# Patient Record
Sex: Male | Born: 1985 | Race: Black or African American | Hispanic: No | Marital: Single | Smoking: Current some day smoker
Health system: Southern US, Community
[De-identification: ages and names within clinical notes are randomized; demographics above are authoritative.]

## PROBLEM LIST (undated history)

## (undated) DIAGNOSIS — I10 Essential (primary) hypertension: Secondary | ICD-10-CM

## (undated) DIAGNOSIS — B20 Human immunodeficiency virus [HIV] disease: Secondary | ICD-10-CM

## (undated) DIAGNOSIS — Z21 Asymptomatic human immunodeficiency virus [HIV] infection status: Secondary | ICD-10-CM

---
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
## 2005-05-01 ENCOUNTER — Emergency Department (HOSPITAL_COMMUNITY): Admission: EM | Admit: 2005-05-01 | Discharge: 2005-05-02 | Payer: Self-pay | Admitting: Emergency Medicine

## 2006-02-10 ENCOUNTER — Emergency Department (HOSPITAL_COMMUNITY): Admission: EM | Admit: 2006-02-10 | Discharge: 2006-02-10 | Payer: Self-pay | Admitting: Emergency Medicine

## 2006-12-01 ENCOUNTER — Emergency Department (HOSPITAL_COMMUNITY): Admission: EM | Admit: 2006-12-01 | Discharge: 2006-12-01 | Payer: Self-pay | Admitting: Emergency Medicine

## 2007-08-25 ENCOUNTER — Emergency Department (HOSPITAL_COMMUNITY): Admission: EM | Admit: 2007-08-25 | Discharge: 2007-08-25 | Payer: Self-pay | Admitting: Emergency Medicine

## 2007-08-30 ENCOUNTER — Emergency Department (HOSPITAL_COMMUNITY): Admission: EM | Admit: 2007-08-30 | Discharge: 2007-08-30 | Payer: Self-pay | Admitting: Emergency Medicine

## 2007-08-30 ENCOUNTER — Emergency Department (HOSPITAL_COMMUNITY): Admission: EM | Admit: 2007-08-30 | Discharge: 2007-08-31 | Payer: Self-pay | Admitting: Emergency Medicine

## 2007-10-16 ENCOUNTER — Emergency Department (HOSPITAL_COMMUNITY): Admission: EM | Admit: 2007-10-16 | Discharge: 2007-10-16 | Payer: Self-pay | Admitting: Emergency Medicine

## 2009-05-05 ENCOUNTER — Emergency Department (HOSPITAL_COMMUNITY): Admission: EM | Admit: 2009-05-05 | Discharge: 2009-05-05 | Payer: Self-pay | Admitting: Emergency Medicine

## 2009-06-22 ENCOUNTER — Emergency Department (HOSPITAL_COMMUNITY): Admission: EM | Admit: 2009-06-22 | Discharge: 2009-06-22 | Payer: Self-pay | Admitting: Emergency Medicine

## 2009-10-19 ENCOUNTER — Emergency Department (HOSPITAL_COMMUNITY): Admission: EM | Admit: 2009-10-19 | Discharge: 2009-10-19 | Payer: Self-pay | Admitting: Emergency Medicine

## 2010-02-24 ENCOUNTER — Emergency Department (HOSPITAL_COMMUNITY)
Admission: EM | Admit: 2010-02-24 | Discharge: 2010-02-24 | Payer: Self-pay | Source: Home / Self Care | Admitting: Emergency Medicine

## 2010-05-29 LAB — DIFFERENTIAL
Lymphocytes Relative: 31 % (ref 12–46)
Monocytes Absolute: 0.5 10*3/uL (ref 0.1–1.0)
Monocytes Relative: 11 % (ref 3–12)
Neutro Abs: 2.7 10*3/uL (ref 1.7–7.7)

## 2010-05-29 LAB — POCT CARDIAC MARKERS
CKMB, poc: <1 ng/mL — ABNORMAL LOW (ref 1.0–8.0)
Troponin i, poc: <0.05 ng/mL (ref 0.00–0.09)

## 2010-05-29 LAB — CBC
HCT: 40.2 % (ref 39.0–52.0)
Hemoglobin: 13.8 g/dL (ref 13.0–17.0)
MCHC: 34.3 g/dL (ref 30.0–36.0)
WBC: 4.8 10*3/uL (ref 4.0–10.5)

## 2010-05-29 LAB — BASIC METABOLIC PANEL
GFR calc non Af Amer: >60 mL/min (ref >60)
Glucose, Bld: 89 mg/dL (ref 70–99)
Potassium: 4.1 mEq/L (ref 3.5–5.1)
Sodium: 138 mEq/L (ref 135–145)

## 2010-06-03 LAB — BASIC METABOLIC PANEL
CO2: 29 mEq/L (ref 19–32)
GFR calc non Af Amer: >60 mL/min (ref >60)
Glucose, Bld: 100 mg/dL — ABNORMAL HIGH (ref 70–99)
Potassium: 4 mEq/L (ref 3.5–5.1)
Sodium: 139 mEq/L (ref 135–145)

## 2010-06-03 LAB — CBC
HCT: 37.2 % — ABNORMAL LOW (ref 39.0–52.0)
Hemoglobin: 13.1 g/dL (ref 13.0–17.0)
RBC: 4.33 MIL/uL (ref 4.22–5.81)
RDW: 13.6 % (ref 11.5–15.5)

## 2010-06-03 LAB — URINALYSIS, ROUTINE W REFLEX MICROSCOPIC
Bilirubin Urine: NEGATIVE
Glucose, UA: NEGATIVE mg/dL
Ketones, ur: NEGATIVE mg/dL
Nitrite: NEGATIVE
Nitrite: NEGATIVE
Specific Gravity, Urine: 1.01 (ref 1.005–1.030)
Urobilinogen, UA: 0.2 mg/dL (ref 0.0–1.0)
pH: 7 (ref 5.0–8.0)

## 2010-06-03 LAB — URINE MICROSCOPIC-ADD ON

## 2010-06-03 LAB — URINE CULTURE: Culture: NO GROWTH

## 2010-06-03 LAB — DIFFERENTIAL
Basophils Absolute: 0 10*3/uL (ref 0.0–0.1)
Eosinophils Relative: 2 % (ref 0–5)
Lymphocytes Relative: 15 % (ref 12–46)
Monocytes Absolute: 0.6 10*3/uL (ref 0.1–1.0)
Monocytes Relative: 11 % (ref 3–12)

## 2010-07-11 ENCOUNTER — Emergency Department (HOSPITAL_COMMUNITY)
Admission: EM | Admit: 2010-07-11 | Discharge: 2010-07-11 | Disposition: A | Discharge status: Home / Self Care | Payer: Self-pay | Attending: Emergency Medicine | Admitting: Emergency Medicine

## 2010-07-11 ENCOUNTER — Emergency Department (HOSPITAL_COMMUNITY): Payer: Self-pay

## 2010-07-11 DIAGNOSIS — M79609 Pain in unspecified limb: Secondary | ICD-10-CM | POA: Insufficient documentation

## 2010-07-11 DIAGNOSIS — L089 Local infection of the skin and subcutaneous tissue, unspecified: Secondary | ICD-10-CM | POA: Insufficient documentation

## 2010-07-11 LAB — GLUCOSE, CAPILLARY: Glucose-Capillary: 98 mg/dL (ref 70–99)

## 2010-08-04 ENCOUNTER — Emergency Department (HOSPITAL_COMMUNITY)
Admission: EM | Admit: 2010-08-04 | Discharge: 2010-08-05 | Disposition: A | Discharge status: Home / Self Care | Payer: Self-pay | Attending: Emergency Medicine | Admitting: Emergency Medicine

## 2010-08-04 DIAGNOSIS — B353 Tinea pedis: Secondary | ICD-10-CM | POA: Insufficient documentation

## 2010-08-04 DIAGNOSIS — L97509 Non-pressure chronic ulcer of other part of unspecified foot with unspecified severity: Secondary | ICD-10-CM | POA: Insufficient documentation

## 2010-12-10 LAB — CBC
HCT: 38.8 — ABNORMAL LOW
HCT: 40.4
Hemoglobin: 13.5
MCHC: 34.7
MCHC: 34.7
MCV: 86.6
Platelets: 187
RDW: 12.8

## 2010-12-10 LAB — COMPREHENSIVE METABOLIC PANEL
AST: 23
Albumin: 4.4
Alkaline Phosphatase: 55
BUN: 6
BUN: 8
CO2: 26
Calcium: 9.4
Calcium: 9.7
Creatinine, Ser: 1.07
GFR calc Af Amer: >60
GFR calc non Af Amer: >60
GFR calc non Af Amer: >60
Glucose, Bld: 90
Total Bilirubin: 0.7
Total Protein: 7.5

## 2010-12-10 LAB — DIFFERENTIAL
Basophils Absolute: 0
Basophils Relative: 0
Eosinophils Relative: 2
Lymphocytes Relative: 40
Lymphs Abs: 2
Monocytes Relative: 9
Neutro Abs: 2.3
Neutro Abs: 3.1
Neutrophils Relative %: 53

## 2010-12-10 LAB — URINALYSIS, ROUTINE W REFLEX MICROSCOPIC
Bilirubin Urine: NEGATIVE
Ketones, ur: NEGATIVE
Nitrite: NEGATIVE
Protein, ur: NEGATIVE
Urobilinogen, UA: 0.2

## 2010-12-10 LAB — STREP A DNA PROBE: Group A Strep Probe: NEGATIVE

## 2010-12-10 LAB — LIPASE, BLOOD
Lipase: 18
Lipase: 22

## 2010-12-10 LAB — RAPID STREP SCREEN (MED CTR MEBANE ONLY): Streptococcus, Group A Screen (Direct): NEGATIVE

## 2011-02-26 ENCOUNTER — Encounter: Payer: Self-pay | Admitting: *Deleted

## 2011-02-26 ENCOUNTER — Emergency Department (HOSPITAL_COMMUNITY)
Admission: EM | Admit: 2011-02-26 | Discharge: 2011-02-26 | Disposition: A | Discharge status: Home / Self Care | Payer: Self-pay | Attending: Emergency Medicine | Admitting: Emergency Medicine

## 2011-02-26 DIAGNOSIS — R112 Nausea with vomiting, unspecified: Secondary | ICD-10-CM | POA: Insufficient documentation

## 2011-02-26 DIAGNOSIS — R109 Unspecified abdominal pain: Secondary | ICD-10-CM | POA: Insufficient documentation

## 2011-02-26 DIAGNOSIS — J45909 Unspecified asthma, uncomplicated: Secondary | ICD-10-CM | POA: Insufficient documentation

## 2011-02-26 DIAGNOSIS — R509 Fever, unspecified: Secondary | ICD-10-CM | POA: Insufficient documentation

## 2011-02-26 DIAGNOSIS — R197 Diarrhea, unspecified: Secondary | ICD-10-CM | POA: Insufficient documentation

## 2011-02-26 DIAGNOSIS — F17200 Nicotine dependence, unspecified, uncomplicated: Secondary | ICD-10-CM | POA: Insufficient documentation

## 2011-02-26 DIAGNOSIS — R10819 Abdominal tenderness, unspecified site: Secondary | ICD-10-CM | POA: Insufficient documentation

## 2011-02-26 LAB — BASIC METABOLIC PANEL
GFR calc Af Amer: >90 mL/min (ref >90)
GFR calc non Af Amer: >90 mL/min (ref >90)
Glucose, Bld: 100 mg/dL — ABNORMAL HIGH (ref 70–99)
Potassium: 3.5 mEq/L (ref 3.5–5.1)
Sodium: 138 mEq/L (ref 135–145)

## 2011-02-26 MED ORDER — SODIUM CHLORIDE 0.9 % IV BOLUS (SEPSIS)
1000.0000 mL | Freq: Once | INTRAVENOUS | Status: AC
  Administered 2011-02-26: 1000 mL via INTRAVENOUS

## 2011-02-26 MED ORDER — ONDANSETRON HCL 4 MG/2ML IJ SOLN
4.0000 mg | Freq: Once | INTRAMUSCULAR | Status: AC
  Administered 2011-02-26: 4 mg via INTRAVENOUS
  Filled 2011-02-26: qty 2

## 2011-02-26 MED ORDER — ONDANSETRON HCL 4 MG PO TABS: 4.0000 mg | ORAL_TABLET | Freq: Four times a day (QID) | ORAL | Status: AC

## 2011-02-26 MED ORDER — HYDROCODONE-ACETAMINOPHEN 7.5-500 MG/15ML PO SOLN: 7.5000 mL | Freq: Four times a day (QID) | ORAL | Status: AC | PRN

## 2011-02-26 MED ORDER — HYDROMORPHONE HCL PF 1 MG/ML IJ SOLN
1.0000 mg | Freq: Once | INTRAMUSCULAR | Status: AC
  Administered 2011-02-26: 1 mg via INTRAVENOUS
  Filled 2011-02-26: qty 1

## 2011-02-26 MED ORDER — ACETAMINOPHEN 500 MG PO TABS
ORAL_TABLET | ORAL | Status: AC
  Administered 2011-02-26: 1000 mg
  Filled 2011-02-26: qty 2

## 2011-02-26 NOTE — ED Notes (Signed)
NVD for 3 days, fever, Abd and back pain.

## 2011-02-26 NOTE — ED Notes (Signed)
NVD for 3 days.  Feels weak.

## 2011-02-26 NOTE — ED Provider Notes (Signed)
History     CSN: [id number] Arrival date & time: 02/26/2011  5:52 AM   First MD Initiated Contact with Patient 02/26/11 [phone]      Chief Complaint  Patient presents with  . Emesis    (Consider location/radiation/quality/duration/timing/severity/associated sxs/prior treatment) Patient is a 25 y.o. male presenting with vomiting. The history is provided by the patient.  Emesis  This is a new problem. The current episode started 6 to 12 hours ago. The problem occurs 2 to 4 times per day. The problem has not changed since onset.The emesis has an appearance of stomach contents. The maximum temperature recorded prior to his arrival was 102 to 102.9 F. The fever has been present for less than 1 day. Associated symptoms include diarrhea, a fever and sweats. Pertinent negatives include no abdominal pain and no headaches. Risk factors: No history of diabetes, recent antibiotics or recent travel.   lives alone has no known sick contacts. Did not get a flu shot this season. Some abdominal cramping but no severe pain. Her back pain, dysuria, urgency or frequency. No hematuria. No bloody stools or watery stools.  Past Medical History  Diagnosis Date  . Asthma     History reviewed. No pertinent past surgical history.  History reviewed. No pertinent family history.  History  Substance Use Topics  . Smoking status: Current Everyday Smoker    Types: Cigarettes  . Smokeless tobacco: Not on file  . Alcohol Use: No      Review of Systems  Constitutional: Positive for fever.  HENT: Negative for sore throat, neck pain and neck stiffness.   Eyes: Negative for pain.  Respiratory: Negative for shortness of breath.   Cardiovascular: Negative for chest pain.  Gastrointestinal: Positive for nausea, vomiting and diarrhea. Negative for abdominal pain, blood in stool and rectal pain.  Genitourinary: Negative for dysuria.  Musculoskeletal: Negative for back pain.  Skin: Negative for rash.    Neurological: Negative for headaches.  All other systems reviewed and are negative.    Allergies  Review of patient's allergies indicates no known allergies.  Home Medications  No current outpatient prescriptions on file.  BP 153/83  Pulse 91  Temp(Src) 102.1 F (38.9 C) (Oral)  Resp 20  SpO2 98%  Physical Exam  Constitutional: He is oriented to person, place, and time. He appears well-developed and well-nourished.  HENT:  Head: Normocephalic and atraumatic.  Eyes: Conjunctivae and EOM are normal. Pupils are equal, round, and reactive to light.  Neck: Trachea normal. Neck supple. No thyromegaly present.  Cardiovascular: Normal rate, regular rhythm, S1 normal, S2 normal and normal pulses.     No systolic murmur is present   No diastolic murmur is present  Pulses:      Radial pulses are 2+ on the right side, and 2+ on the left side.  Pulmonary/Chest: Effort normal and breath sounds normal. He has no wheezes. He has no rhonchi. He has no rales. He exhibits no tenderness.  Abdominal: Soft. Normal appearance and bowel sounds are normal. He exhibits no mass. There is no rebound, no guarding, no CVA tenderness and negative Murphy's sign.       Very mild diffuse abdominal tenderness without peritoneal signs  Neurological: He is alert and oriented to person, place, and time. He has normal strength. No cranial nerve deficit or sensory deficit. GCS eye subscore is 4. GCS verbal subscore is 5. GCS motor subscore is 6.  Skin: Skin is warm and dry. No rash noted. He is not  diaphoretic.  Psychiatric: His speech is normal.       Cooperative and appropriate    ED Course  Procedures (including critical care time)  Results for orders placed during the hospital encounter of 02/26/11  BASIC METABOLIC PANEL      Component Value Range   Sodium 138  135 - 145 (mEq/L)   Potassium 3.5  3.5 - 5.1 (mEq/L)   Chloride 103  96 - 112 (mEq/L)   CO2 26  19 - 32 (mEq/L)   Glucose, Bld 100 (*) 70 - 99  (mg/dL)   BUN 6  6 - 23 (mg/dL)   Creatinine, Ser 1.61  0.50 - 1.35 (mg/dL)   Calcium 8.4  8.4 - 09.6 (mg/dL)   GFR calc non Af Amer >90  >90 (mL/min)   GFR calc Af Amer >90  >90 (mL/min)     IV fluids, Zofran and Tylenol for fever. Serial abdominal evaluations without peritonitis   MDM   Otherwise healthy adult male presenting with Fever with nausea vomiting and diarrhea. Flulike symptoms. No abdominal findings to suggest indication for imaging at this time.  Patient is not immunocompromised and does not have risk factors for C. Difficile. Given IV fluids and Zofran Tylenol with improving symptoms.        Sunnie Nielsen, MD 02/26/11 [phone]

## 2011-02-27 ENCOUNTER — Encounter (HOSPITAL_COMMUNITY): Payer: Self-pay | Admitting: Emergency Medicine

## 2011-02-27 ENCOUNTER — Emergency Department (HOSPITAL_COMMUNITY)
Admission: EM | Admit: 2011-02-27 | Discharge: 2011-02-27 | Disposition: A | Discharge status: Home / Self Care | Payer: Self-pay | Attending: Emergency Medicine | Admitting: Emergency Medicine

## 2011-02-27 DIAGNOSIS — J111 Influenza due to unidentified influenza virus with other respiratory manifestations: Secondary | ICD-10-CM | POA: Insufficient documentation

## 2011-02-27 DIAGNOSIS — F17200 Nicotine dependence, unspecified, uncomplicated: Secondary | ICD-10-CM | POA: Insufficient documentation

## 2011-02-27 DIAGNOSIS — J45909 Unspecified asthma, uncomplicated: Secondary | ICD-10-CM | POA: Insufficient documentation

## 2011-02-27 DIAGNOSIS — B349 Viral infection, unspecified: Secondary | ICD-10-CM

## 2011-02-27 MED ORDER — ONDANSETRON 8 MG PO TBDP
8.0000 mg | ORAL_TABLET | Freq: Once | ORAL | Status: AC
  Administered 2011-02-27: 8 mg via ORAL
  Filled 2011-02-27: qty 1

## 2011-02-27 MED ORDER — HYDROCODONE-ACETAMINOPHEN 5-325 MG PO TABS: ORAL_TABLET | ORAL | Status: AC

## 2011-02-27 NOTE — ED Notes (Signed)
Pt was seen yesterday and diagnosed with the flu and was given a prescription that he could not afford. So pt is back c/o chills, fever, and body aches.

## 2011-02-27 NOTE — ED Provider Notes (Signed)
History     CSN: [id number] Arrival date & time: 02/27/2011  7:51 AM   First MD Initiated Contact with Patient 02/27/11 [phone]      Chief Complaint  Patient presents with  . Fever  . Chills    (Consider location/radiation/quality/duration/timing/severity/associated sxs/prior treatment) HPI Comments: Patient was seen here yesterday and treated for flu like illness.  States he was given prescriptions for medication, but was unable to afford them.  He returns today c/o continued body aches, chills and fever.  He denies vomiting or dysuria.    Patient is a 25 y.o. male presenting with flu symptoms. The history is provided by the patient.  Influenza This is a new problem. The current episode started in the past 7 days. The problem occurs constantly. The problem has been unchanged. Associated symptoms include chills, congestion, a fever, myalgias and nausea. Pertinent negatives include no abdominal pain, arthralgias, headaches, neck pain, numbness, sore throat, swollen glands, vomiting or weakness. The symptoms are aggravated by nothing. He has tried nothing for the symptoms. The treatment provided no relief.    Past Medical History  Diagnosis Date  . Asthma     History reviewed. No pertinent past surgical history.  History reviewed. No pertinent family history.  History  Substance Use Topics  . Smoking status: Current Everyday Smoker    Types: Cigarettes  . Smokeless tobacco: Not on file  . Alcohol Use: No      Review of Systems  Constitutional: Positive for fever and chills. Negative for activity change and appetite change.  HENT: Positive for congestion. Negative for sore throat, neck pain and neck stiffness.   Gastrointestinal: Positive for nausea. Negative for vomiting, abdominal pain and diarrhea.  Genitourinary: Negative for dysuria.  Musculoskeletal: Positive for myalgias. Negative for arthralgias.  Skin: Negative.   Neurological: Negative for weakness, numbness and  headaches.  All other systems reviewed and are negative.    Allergies  Review of patient's allergies indicates no known allergies.  Home Medications   Current Outpatient Rx  Name Route Sig Dispense Refill  . HYDROCODONE-ACETAMINOPHEN 7.5-500 MG/15ML PO SOLN Oral Take 7.5 mLs by mouth every 6 (six) hours as needed for pain (No driving on medicated). 60 mL 0  . ONDANSETRON HCL 4 MG PO TABS Oral Take 1 tablet (4 mg total) by mouth every 6 (six) hours. 12 tablet 0    BP 143/84  Pulse 73  Temp(Src) 99.2 F (37.3 C) (Oral)  Resp 18  Ht 5\' 7"  (1.702 m)  Wt 187 lb (84.823 kg)  BMI 29.29 kg/m2  SpO2 97%  Physical Exam  Nursing note and vitals reviewed. Constitutional: He is oriented to person, place, and time. He appears well-developed and well-nourished. No distress.  HENT:  Head: Normocephalic and atraumatic.  Mouth/Throat: Oropharynx is clear and moist.  Neck: Normal range of motion. Neck supple.  Cardiovascular: Normal rate, regular rhythm and normal heart sounds.   Pulmonary/Chest: Effort normal and breath sounds normal. No respiratory distress. He exhibits no tenderness.  Abdominal: Soft. He exhibits no distension. There is no tenderness.  Musculoskeletal: Normal range of motion. He exhibits no tenderness.  Lymphadenopathy:    He has no cervical adenopathy.  Neurological: He is alert and oriented to person, place, and time. No cranial nerve deficit. He exhibits normal muscle tone. Coordination normal.  Skin: Skin is warm and dry.    ED Course  Procedures (including critical care time)  Labs Reviewed - No data to display No results found.  No diagnosis found.    MDM    9:28 AM patient is alert, NAD.  Abd is soft, NT.  No guarding or rebound tenderness.  Vitals are stable.  Will try oral fluid challenge.  I have reviewed patient previous ED chart.    10:18 AM patient is feeling better, has been tolerating fluids since earlier this morning. I will change his Rx  to lortab tablets and I have discarded his previous prescription for lortab syrup      Keldric Poyer L. [place], [place] 03/01/11 2023

## 2011-03-01 NOTE — ED Provider Notes (Signed)
Medical screening examination/treatment/procedure(s) were performed by non-physician practitioner and as supervising physician I was immediately available for consultation/collaboration.   KHYLE GOODELL, DO 03/01/11 2118

## 2011-08-13 ENCOUNTER — Encounter (HOSPITAL_COMMUNITY): Payer: Self-pay | Admitting: *Deleted

## 2011-08-13 ENCOUNTER — Emergency Department (HOSPITAL_COMMUNITY): Payer: Self-pay

## 2011-08-13 ENCOUNTER — Emergency Department (HOSPITAL_COMMUNITY)
Admission: EM | Admit: 2011-08-13 | Discharge: 2011-08-13 | Disposition: A | Discharge status: Home / Self Care | Payer: Self-pay | Attending: Emergency Medicine | Admitting: Emergency Medicine

## 2011-08-13 DIAGNOSIS — F17200 Nicotine dependence, unspecified, uncomplicated: Secondary | ICD-10-CM | POA: Insufficient documentation

## 2011-08-13 DIAGNOSIS — M25559 Pain in unspecified hip: Secondary | ICD-10-CM | POA: Insufficient documentation

## 2011-08-13 DIAGNOSIS — S73109A Unspecified sprain of unspecified hip, initial encounter: Secondary | ICD-10-CM

## 2011-08-13 MED ORDER — NAPROXEN 500 MG PO TABS: 500.0000 mg | ORAL_TABLET | Freq: Two times a day (BID) | ORAL | Status: DC

## 2011-08-13 MED ORDER — CYCLOBENZAPRINE HCL 10 MG PO TABS: 10.0000 mg | ORAL_TABLET | Freq: Three times a day (TID) | ORAL | Status: AC | PRN

## 2011-08-13 NOTE — Discharge Instructions (Signed)
Ligament Sprain Ligaments are tough, fibrous tissues that hold bones together at the joints. A sprain can occur when a ligament is stretched. This injury may take several weeks to heal. HOME CARE INSTRUCTIONS   Rest the injured area for as long as directed by your caregiver. Then slowly start using the joint as directed by your caregiver and as the pain allows.   Keep the affected joint raised if possible to lessen swelling.   Apply ice for 15 to 20 minutes to the injured area every couple hours for the first half day, then 3 to 4 times per day for the first 48 hours. Put the ice in a plastic bag and place a towel between the bag of ice and your skin.   Wear any splinting, casting, or elastic bandage applications as instructed.   Only take over-the-counter or prescription medicines for pain, discomfort, or fever as directed by your caregiver. Do not use aspirin immediately after the injury unless instructed by your caregiver. Aspirin can cause increased bleeding and bruising of the tissues.   If you were given crutches, continue to use them as instructed and do not resume weight bearing on the affected extremity until instructed.  SEEK MEDICAL CARE IF:   Your bruising, swelling, or pain increases.   You have cold and numb fingers or toes if your arm or leg was injured.  SEEK IMMEDIATE MEDICAL CARE IF:   Your toes are numb or blue if your leg was injured.   Your fingers are numb or blue if your arm was injured.   Your pain is not responding to medicines and continues to stay the same or gets worse.  MAKE SURE YOU:   Understand these instructions.   Will watch your condition.   Will get help right away if you are not doing well or get worse.  Document Released: 02/27/2000 Document Revised: 02/18/2011 Document Reviewed: 12/26/2007 [facility] Patient Information 2012 [place], [place].Hip Pain The hips join the upper legs to the lower pelvis. The bones, cartilage, tendons, and muscles of  the hip joint perform a lot of work each day holding your body weight and allowing you to move around. Hip pain is a common symptom. It can range from a minor ache to severe pain on 1 or both hips. Pain may be felt on the inside of the hip joint near the groin, or the outside near the buttocks and upper thigh. There may be swelling or stiffness as well. It occurs more often when a person walks or performs activity. There are many reasons hip pain can develop. CAUSES  It is important to work with your caregiver to identify the cause since many conditions can impact the bones, cartilage, muscles, and tendons of the hips. Causes for hip pain include:  Broken (fractured) bones.   Separation of the thighbone from the hip socket (dislocation).   Torn cartilage of the hip joint.   Swelling (inflammation) of a tendon (tendonitis), the sac within the hip joint (bursitis), or a joint.   A weakening in the abdominal wall (hernia), affecting the nerves to the hip.   Arthritis in the hip joint or lining of the hip joint.   Pinched nerves in the back, hip, or upper thigh.   A bulging disc in the spine (herniated disc).   Rarely, bone infection or cancer.  DIAGNOSIS  The location of your hip pain will help your caregiver understand what may be causing the pain. A diagnosis is based on your medical  history, your symptoms, results from your physical exam, and results from diagnostic tests. Diagnostic tests may include X-ray exams, a computerized magnetic scan (magnetic resonance imaging, MRI), or bone scan. TREATMENT  Treatment will depend on the cause of your hip pain. Treatment may include:  Limiting activities and resting until symptoms improve.   Crutches or other walking supports (a cane or brace).   Ice, elevation, and compression.   Physical therapy or home exercises.   Shoe inserts or special shoes.   Losing weight.   Medications to reduce pain.   Undergoing surgery.  HOME CARE  INSTRUCTIONS   Only take over-the-counter or prescription medicines for pain, discomfort, or fever as directed by your caregiver.   Put ice on the injured area:   Put ice in a plastic bag.   Place a towel between your skin and the bag.   Leave the ice on for 15 to 20 minutes at a time, 3 to 4 times a day.   Keep your leg raised (elevated) when possible to lessen swelling.   Avoid activities that cause pain.   Follow specific exercises as directed by your caregiver.   Sleep with a pillow between your legs on your most comfortable side.   Record how often you have hip pain, the location of the pain, and what it feels like. This information may be helpful to you and your caregiver.   Ask your caregiver about returning to work or sports and whether you should drive.   Follow up with your caregiver for further exams, therapy, or testing as directed.  SEEK MEDICAL CARE IF:   Your pain or swelling continues or worsens after 1 week.   You are feeling unwell or have chills.   You have increasing difficulty with walking.   You have a loss of sensation or other new symptoms.   You have questions or concerns.  SEEK IMMEDIATE MEDICAL CARE IF:   You cannot put weight on the affected hip.   You have fallen.   You have a sudden increase in pain and swelling in your hip.   You have a fever.  MAKE SURE YOU:   Understand these instructions.   Will watch your condition.   Will get help right away if you are not doing well or get worse.  Document Released: 08/19/2009 Document Revised: 02/18/2011 Document Reviewed: 08/19/2009 [facility] Patient Information 2012 [place], [place].

## 2011-08-13 NOTE — ED Provider Notes (Signed)
History     CSN: [id number]  Arrival date & time 08/13/11  1907   First MD Initiated Contact with Patient 08/13/11 1939      Chief Complaint  Patient presents with  . Hip Pain    (Consider location/radiation/quality/duration/timing/severity/associated sxs/prior treatment) HPI Comments: Patient c/o pain to his right lateral hip after practicing a "dance move" and twisted his hip while his foot was stationary.  Pain is worse with flexing the hip or weight bearing and improves with rest.  He has not taken any medications or applied ice. Denies back pain, abd pain or radiating pain    Patient is a 26 y.o. male presenting with hip pain. The history is provided by the patient.  Hip Pain This is a new problem. The current episode started today. The problem occurs constantly. The problem has been unchanged. Associated symptoms include arthralgias. Pertinent negatives include no abdominal pain, chest pain, chills, fever, joint swelling, nausea, numbness, urinary symptoms, vomiting or weakness. The symptoms are aggravated by standing, twisting and walking. He has tried nothing for the symptoms. The treatment provided no relief.    Past Medical History  Diagnosis Date  . Asthma     No past surgical history on file.  History reviewed. No pertinent family history.  History  Substance Use Topics  . Smoking status: Current Everyday Smoker    Types: Cigarettes  . Smokeless tobacco: Not on file  . Alcohol Use: No      Review of Systems  Constitutional: Negative for fever and chills.  Cardiovascular: Negative for chest pain.  Gastrointestinal: Negative for nausea, vomiting and abdominal pain.  Genitourinary: Negative for dysuria, flank pain, decreased urine volume, scrotal swelling, difficulty urinating and testicular pain.  Musculoskeletal: Positive for arthralgias. Negative for joint swelling.  Skin: Negative for color change and wound.  Neurological: Negative for weakness and  numbness.  All other systems reviewed and are negative.    Allergies  Peanut-containing drug products  Home Medications   Current Outpatient Rx  Name Route Sig Dispense Refill  . DIPHENHYDRAMINE-APAP (SLEEP) 25-500 MG PO TABS Oral Take 2 tablets by mouth as needed. For pain      BP 147/81  Pulse 89  Temp(Src) 99.1 F (37.3 C) (Oral)  Resp 20  Ht 5\' 8"  (1.727 m)  Wt 180 lb (81.647 kg)  BMI 27.37 kg/m2  SpO2 100%  Physical Exam  Nursing note and vitals reviewed. Constitutional: He is oriented to person, place, and time. He appears well-developed and well-nourished. No distress.  HENT:  Head: Normocephalic and atraumatic.  Neck: Normal range of motion. Neck supple.  Cardiovascular: Normal rate, regular rhythm and intact distal pulses.   No murmur heard. Pulmonary/Chest: Effort normal and breath sounds normal.  Musculoskeletal: He exhibits tenderness. He exhibits no edema.       Right hip: He exhibits tenderness. He exhibits normal range of motion, normal strength, no bony tenderness, no swelling, no crepitus, no deformity and no laceration.       Legs: Neurological: He is alert and oriented to person, place, and time. No cranial nerve deficit or sensory deficit. He exhibits normal muscle tone. Coordination and gait normal.  Reflex Scores:      Patellar reflexes are 2+ on the right side and 2+ on the left side.      Achilles reflexes are 2+ on the right side and 2+ on the left side. Skin: Skin is warm and dry.    ED Course  Procedures (including critical  care time)  Labs Reviewed - No data to display Dg Hip Complete Right  08/13/2011  *RADIOLOGY REPORT*  Clinical Data: Hip pain  RIGHT HIP - COMPLETE 2+ VIEW  Comparison: None  Findings: There is no evidence of fracture or dislocation.  There is no evidence of arthropathy or other focal bone abnormality. Soft tissues are unremarkable.  IMPRESSION: Negative exam.  Original Report Authenticated By: Rosealee Albee, M.D.         MDM     Patient ambulates with a limp on the right, no focal neuro deficits on exam.  Likely sprain/strain.  Clinical suspicion for infectious or neurological process is low.  Pt agrees to f/u with ortho if needed  Patient / Family / Caregiver understand and agree with initial ED impression and plan with expectations set for ED visit. Pt stable in ED with no significant deterioration in condition. Pt feels improved after observation and/or treatment in ED.    Prescribed: Naprosyn flexeril   Ramadan Couey L. [place], [place] 08/17/11 1947

## 2011-08-13 NOTE — ED Notes (Signed)
Pt states was in dance practice, "busting a move when my hip popped". Pt can bear weight on rt hip/leg, however pt was noticed limping when he ambulated to radiology. No deformity noted.

## 2011-08-13 NOTE — ED Notes (Signed)
Pain rt hip after practicing a dance move. Heard a pop

## 2011-08-21 NOTE — ED Provider Notes (Signed)
Medical screening examination/treatment/procedure(s) were performed by non-physician practitioner and as supervising physician I was immediately available for consultation/collaboration.   Alaya Iverson W. Azoria Abbett, MD 08/21/11 0022 

## 2011-10-08 ENCOUNTER — Emergency Department (HOSPITAL_COMMUNITY)
Admission: EM | Admit: 2011-10-08 | Discharge: 2011-10-08 | Disposition: A | Discharge status: Home / Self Care | Payer: Self-pay | Attending: Emergency Medicine | Admitting: Emergency Medicine

## 2011-10-08 ENCOUNTER — Emergency Department (HOSPITAL_COMMUNITY): Payer: Self-pay

## 2011-10-08 ENCOUNTER — Encounter (HOSPITAL_COMMUNITY): Payer: Self-pay

## 2011-10-08 DIAGNOSIS — M25572 Pain in left ankle and joints of left foot: Secondary | ICD-10-CM

## 2011-10-08 DIAGNOSIS — M25579 Pain in unspecified ankle and joints of unspecified foot: Secondary | ICD-10-CM | POA: Insufficient documentation

## 2011-10-08 DIAGNOSIS — F17200 Nicotine dependence, unspecified, uncomplicated: Secondary | ICD-10-CM | POA: Insufficient documentation

## 2011-10-08 MED ORDER — NAPROXEN 250 MG PO TABS: 250.0000 mg | ORAL_TABLET | Freq: Two times a day (BID) | ORAL | Status: DC

## 2011-10-08 MED ORDER — ACETAMINOPHEN 325 MG PO TABS
650.0000 mg | ORAL_TABLET | Freq: Once | ORAL | Status: AC
  Administered 2011-10-08: 650 mg via ORAL
  Filled 2011-10-08: qty 2

## 2011-10-08 MED ORDER — IBUPROFEN 400 MG PO TABS
400.0000 mg | ORAL_TABLET | Freq: Once | ORAL | Status: AC
  Administered 2011-10-08: 400 mg via ORAL
  Filled 2011-10-08: qty 1

## 2011-10-08 NOTE — ED Notes (Signed)
MD at bedside. 

## 2011-10-08 NOTE — ED Notes (Signed)
Pt reports pain to his ankles bilaterally.  Pt reports doing a lot of walking, and dancing.  Pt reports " i think i just put too much stress on them".   Pt denies any injury.

## 2011-10-08 NOTE — ED Notes (Signed)
C/o bilateral ankle pain, able to ambulate to room

## 2011-10-08 NOTE — ED Provider Notes (Signed)
History     CSN: [id number]  Arrival date & time 10/08/11  1301   First MD Initiated Contact with Patient 10/08/11 1314      Chief Complaint  Patient presents with  . Ankle Pain     HPI Pt was seen at 1315.  Per pt, c/o gradual onset and persistence of constant bilateral ankles "pain" that began this morning.  Pt states for the past several days he has been "doing a lot of dancing and walking."  Denies injury, no hips/knees/feet pain, no fevers, no rash, no swelling, no focal motor weakness, no tingling/numbness in extremities.     Past Medical History  Diagnosis Date  . Asthma     History reviewed. No pertinent past surgical history.   History  Substance Use Topics  . Smoking status: Current Everyday Smoker    Types: Cigarettes  . Smokeless tobacco: Not on file  . Alcohol Use: No    Review of Systems ROS: Statement: All systems negative except as marked or noted in the HPI; Constitutional: Negative for fever and chills. ; ; Eyes: Negative for eye pain, redness and discharge. ; ; ENMT: Negative for ear pain, hoarseness, nasal congestion, sinus pressure and sore throat. ; ; Cardiovascular: Negative for chest pain, palpitations, diaphoresis, dyspnea and peripheral edema. ; ; Respiratory: Negative for cough, wheezing and stridor. ; ; Gastrointestinal: Negative for nausea, vomiting, diarrhea, abdominal pain, blood in stool, hematemesis, jaundice and rectal bleeding. . ; ; Genitourinary: Negative for dysuria, flank pain and hematuria. ; ; Musculoskeletal: +ankles pain. Negative for back pain and neck pain. Negative for deformity..; ; Skin: Negative for pruritus, rash, abrasions, blisters, bruising and skin lesion.; ; Neuro: Negative for headache, lightheadedness and neck stiffness. Negative for weakness, altered level of consciousness , altered mental status, extremity weakness, paresthesias, involuntary movement, seizure and syncope.     Allergies  Peanut-containing drug  products  Home Medications   Current Outpatient Rx  Name Route Sig Dispense Refill  . DIPHENHYDRAMINE-APAP (SLEEP) 25-500 MG PO TABS Oral Take 2 tablets by mouth as needed. For pain    . NAPROXEN 500 MG PO TABS Oral Take 1 tablet (500 mg total) by mouth 2 (two) times daily. 20 tablet 0    BP 164/95  Pulse 84  Temp 98.1 F (36.7 C) (Oral)  Resp 18  SpO2 100%  Physical Exam 1320: Physical examination:  Nursing notes reviewed; Vital signs and O2 SAT reviewed;  Constitutional: Well developed, Well nourished, Well hydrated, In no acute distress; Head:  Normocephalic, atraumatic; Eyes: EOMI, PERRL, No scleral icterus; ENMT: Mouth and pharynx normal, Mucous membranes moist; Neck: Supple, Full range of motion, No lymphadenopathy; Cardiovascular: Regular rate and rhythm, No murmur, rub, or gallop; Respiratory: Breath sounds clear & equal bilaterally, No rales, rhonchi, wheezes.  Speaking full sentences with ease, Normal respiratory effort/excursion; Chest: Nontender, Movement normal; Extremities: NT bilat knees, ankles, and feet. NMS intact bilateral feet, strong pedal pp, bilat LE muscle compartments soft.  No bilat proximal fibular head tenderness.  No edema, no deformity, no ecchymosis, no erythema, no warmth, no open wounds to bilat ankles.  +plantarflexion of bilat feet w/calf squeeze.  No palpable gap bilat Achilles's tendons. Pulses normal, No deformity. No edema, No calf edema or asymmetry.; Neuro: AA&Ox3, Major CN grossly intact.  Speech clear. No gross focal motor or sensory deficits in extremities.; Skin: Color normal, Warm, Dry.   ED Course  Procedures   MDM  MDM Reviewed: nursing note, vitals and previous  chart Interpretation: x-ray     Dg Ankle Complete Left 10/08/2011  *RADIOLOGY REPORT*  Clinical Data: Bilateral ankle pain  LEFT ANKLE COMPLETE - 3+ VIEW  Comparison: None.  Findings: Three views of the left ankle submitted.  No acute fracture or subluxation.  Ankle mortise is  preserved.  IMPRESSION: No acute fracture or subluxation.  Original Report Authenticated By: Natasha Mead, M.D.   Dg Ankle Complete Right 10/08/2011  *RADIOLOGY REPORT*  Clinical Data: Bilateral ankle pain  RIGHT ANKLE - COMPLETE 3+ VIEW  Comparison: None.  Findings: Three views of the right ankle submitted.  No acute fracture or subluxation.  Ankle mortise is preserved.  IMPRESSION: No acute fracture or subluxation.  Original Report Authenticated By: Natasha Mead, M.D.     1400:  No acute findings on XR or on exam.  Will tx symptomatically for what appears to be overuse at this time.  Dx testing d/w pt.  Questions answered.  Verb understanding, agreeable to d/c home with outpt f/u.     Laray Anger, DO 10/08/11 Merrily Brittle

## 2012-06-24 ENCOUNTER — Emergency Department (HOSPITAL_COMMUNITY): Payer: Self-pay

## 2012-06-24 ENCOUNTER — Encounter (HOSPITAL_COMMUNITY): Payer: Self-pay | Admitting: Emergency Medicine

## 2012-06-24 ENCOUNTER — Emergency Department (HOSPITAL_COMMUNITY)
Admission: EM | Admit: 2012-06-24 | Discharge: 2012-06-24 | Disposition: A | Discharge status: Home / Self Care | Payer: Self-pay | Attending: Emergency Medicine | Admitting: Emergency Medicine

## 2012-06-24 DIAGNOSIS — R1084 Generalized abdominal pain: Secondary | ICD-10-CM | POA: Insufficient documentation

## 2012-06-24 DIAGNOSIS — J45909 Unspecified asthma, uncomplicated: Secondary | ICD-10-CM | POA: Insufficient documentation

## 2012-06-24 DIAGNOSIS — R109 Unspecified abdominal pain: Secondary | ICD-10-CM

## 2012-06-24 DIAGNOSIS — F17200 Nicotine dependence, unspecified, uncomplicated: Secondary | ICD-10-CM | POA: Insufficient documentation

## 2012-06-24 DIAGNOSIS — R51 Headache: Secondary | ICD-10-CM | POA: Insufficient documentation

## 2012-06-24 DIAGNOSIS — R110 Nausea: Secondary | ICD-10-CM | POA: Insufficient documentation

## 2012-06-24 LAB — CBC WITH DIFFERENTIAL/PLATELET
Basophils Absolute: 0 10*3/uL (ref 0.0–0.1)
Basophils Relative: 0 % (ref 0–1)
Eosinophils Absolute: 0 10*3/uL (ref 0.0–0.7)
Eosinophils Relative: 1 % (ref 0–5)
HCT: 30 % — ABNORMAL LOW (ref 39.0–52.0)
Hemoglobin: 10.6 g/dL — ABNORMAL LOW (ref 13.0–17.0)
Lymphocytes Relative: 26 % (ref 12–46)
Lymphs Abs: 1.2 10*3/uL (ref 0.7–4.0)
MCH: 28.6 pg (ref 26.0–34.0)
MCHC: 35.3 g/dL (ref 30.0–36.0)
MCV: 81.1 fL (ref 78.0–100.0)
Monocytes Absolute: 0.6 10*3/uL (ref 0.1–1.0)
Monocytes Relative: 12 % (ref 3–12)
Neutro Abs: 2.9 10*3/uL (ref 1.7–7.7)
Neutrophils Relative %: 61 % (ref 43–77)
Platelets: 246 10*3/uL (ref 150–400)
RBC: 3.7 MIL/uL — ABNORMAL LOW (ref 4.22–5.81)
RDW: 13.5 % (ref 11.5–15.5)
WBC: 4.7 10*3/uL (ref 4.0–10.5)

## 2012-06-24 LAB — COMPREHENSIVE METABOLIC PANEL
ALT: 11 U/L (ref 0–53)
AST: 22 U/L (ref 0–37)
Albumin: 3.1 g/dL — ABNORMAL LOW (ref 3.5–5.2)
Alkaline Phosphatase: 63 U/L (ref 39–117)
BUN: 6 mg/dL (ref 6–23)
CO2: 28 mEq/L (ref 19–32)
Calcium: 8.7 mg/dL (ref 8.4–10.5)
Chloride: 99 mEq/L (ref 96–112)
Creatinine, Ser: 0.96 mg/dL (ref 0.50–1.35)
GFR calc Af Amer: >90 mL/min (ref >90)
GFR calc non Af Amer: >90 mL/min (ref >90)
Glucose, Bld: 83 mg/dL (ref 70–99)
Potassium: 3.7 mEq/L (ref 3.5–5.1)
Sodium: 134 mEq/L — ABNORMAL LOW (ref 135–145)
Total Bilirubin: 0.2 mg/dL — ABNORMAL LOW (ref 0.3–1.2)
Total Protein: 10.4 g/dL — ABNORMAL HIGH (ref 6.0–8.3)

## 2012-06-24 LAB — URINALYSIS, ROUTINE W REFLEX MICROSCOPIC
Bilirubin Urine: NEGATIVE
Glucose, UA: NEGATIVE mg/dL
Ketones, ur: NEGATIVE mg/dL
Leukocytes, UA: NEGATIVE
Nitrite: NEGATIVE
Protein, ur: 30 mg/dL — AB
Specific Gravity, Urine: 1.025 (ref 1.005–1.030)
Urobilinogen, UA: 0.2 mg/dL (ref 0.0–1.0)
pH: 6 (ref 5.0–8.0)

## 2012-06-24 LAB — URINE MICROSCOPIC-ADD ON

## 2012-06-24 LAB — LIPASE, BLOOD: Lipase: 20 U/L (ref 11–59)

## 2012-06-24 MED ORDER — IOHEXOL 300 MG/ML  SOLN
50.0000 mL | Freq: Once | INTRAMUSCULAR | Status: AC | PRN
  Administered 2012-06-24: 50 mL via ORAL

## 2012-06-24 MED ORDER — DIPHENHYDRAMINE HCL 50 MG/ML IJ SOLN
25.0000 mg | Freq: Once | INTRAMUSCULAR | Status: AC
  Administered 2012-06-24: 25 mg via INTRAVENOUS
  Filled 2012-06-24: qty 1

## 2012-06-24 MED ORDER — ONDANSETRON HCL 4 MG/2ML IJ SOLN
4.0000 mg | Freq: Once | INTRAMUSCULAR | Status: AC
Start: 2012-06-24 — End: 2012-06-24
  Administered 2012-06-24: 4 mg via INTRAVENOUS
  Filled 2012-06-24: qty 2

## 2012-06-24 MED ORDER — IOHEXOL 300 MG/ML  SOLN
100.0000 mL | Freq: Once | INTRAMUSCULAR | Status: AC | PRN
  Administered 2012-06-24: 100 mL via INTRAVENOUS

## 2012-06-24 MED ORDER — KETOROLAC TROMETHAMINE 30 MG/ML IJ SOLN
30.0000 mg | Freq: Once | INTRAMUSCULAR | Status: AC
  Administered 2012-06-24: 30 mg via INTRAVENOUS
  Filled 2012-06-24: qty 1

## 2012-06-24 MED ORDER — FENTANYL CITRATE 0.05 MG/ML IJ SOLN
100.0000 ug | Freq: Once | INTRAMUSCULAR | Status: AC
  Administered 2012-06-24: 100 ug via INTRAVENOUS
  Filled 2012-06-24: qty 2

## 2012-06-24 MED ORDER — SODIUM CHLORIDE 0.9 % IV BOLUS (SEPSIS)
1000.0000 mL | Freq: Once | INTRAVENOUS | Status: AC
  Administered 2012-06-24: 1000 mL via INTRAVENOUS

## 2012-06-24 MED ORDER — METOCLOPRAMIDE HCL 5 MG/ML IJ SOLN
10.0000 mg | Freq: Once | INTRAMUSCULAR | Status: AC
  Administered 2012-06-24: 10 mg via INTRAVENOUS
  Filled 2012-06-24: qty 2

## 2012-06-24 NOTE — ED Notes (Signed)
AVW:[id number]<XB> Expected date:06/24/12<BR> Expected time: 3:06 PM<BR> Means of arrival:Ambulance<BR> Comments:<BR> Headache

## 2012-06-24 NOTE — ED Notes (Signed)
Pt arrives by EMS from home c/o headache x's 4 days ago, reports has worsened today. Pt took ibuprofen last night without relief. Reports nuasea.

## 2012-06-24 NOTE — ED Notes (Signed)
Pt states he began having abdominal pain and a migraine four days ago. Pt states he took 1000mg  of ibuprofen yesterday and medication was ineffective.

## 2012-06-24 NOTE — ED Notes (Signed)
Pt  states abdominal pain has improved but headache pain is unchanged.

## 2012-06-24 NOTE — ED Notes (Signed)
Patient transported to CT 

## 2012-06-24 NOTE — ED Notes (Signed)
Pt is reminded of the need for a urine sample, urinal remains at bedside.

## 2012-06-26 NOTE — ED Provider Notes (Signed)
History     CSN: [id number]  Arrival date & time 06/24/12  1502   First MD Initiated Contact with Patient 06/24/12 1520      Chief Complaint  Patient presents with  . Abdominal Pain  . Headache    (Consider location/radiation/quality/duration/timing/severity/associated sxs/prior treatment) HPI Patient presents to the emergency department with headache and abdominal pain.  Patient, states, abdominal pain, started yesterday, and is diffuse in nature.  He denies chest pain, shortness of breath, visual changes, bleeding, hematuria, bloody stool, back pain, dysuria, fever, weakness, numbness, dizziness, or syncope.  Patient, states she's had some nausea, but no actual vomiting.  Patient, states, that he did not take anything prior to arrival for symptoms.  Patient, states, that lights make his headache, worse.  Patient, states, that movement and palpation make his abdominal pain, worse. Past Medical History  Diagnosis Date  . Asthma     History reviewed. No pertinent past surgical history.  History reviewed. No pertinent family history.  History  Substance Use Topics  . Smoking status: Current Every Day Smoker    Types: Cigarettes  . Smokeless tobacco: Not on file  . Alcohol Use: No      Review of Systems All other systems negative except as documented in the HPI. All pertinent positives and negatives as reviewed in the HPI. Allergies  Peanut-containing drug products  Home Medications   Current Outpatient Rx  Name  Route  Sig  Dispense  Refill  . ibuprofen (ADVIL,MOTRIN) 200 MG tablet   Oral   Take 200 mg by mouth every 6 (six) hours as needed for pain.           BP 133/69  Pulse 82  Temp(Src) 99.2 F (37.3 C) (Oral)  Resp 18  Ht 5\' 8"  (1.727 m)  Wt 190 lb (86.183 kg)  BMI 28.9 kg/m2  SpO2 100%  Physical Exam  Nursing note and vitals reviewed. Constitutional: He is oriented to person, place, and time. He appears well-developed and well-nourished. No  distress.  HENT:  Head: Normocephalic and atraumatic.  Mouth/Throat: Oropharynx is clear and moist.  Eyes: Pupils are equal, round, and reactive to light.  Neck: Normal range of motion. Neck supple.  Cardiovascular: Normal rate, regular rhythm and normal heart sounds.  Exam reveals no gallop and no friction rub.   No murmur heard. Pulmonary/Chest: Effort normal and breath sounds normal. No respiratory distress.  Abdominal: Soft. Bowel sounds are normal. He exhibits no distension. There is tenderness. There is no rebound and no guarding.  Neurological: He is alert and oriented to person, place, and time. He exhibits normal muscle tone. Coordination normal.  Skin: Skin is warm and dry. No rash noted.    ED Course  Procedures (including critical care time)  Labs Reviewed  COMPREHENSIVE METABOLIC PANEL - Abnormal; Notable for the following:    Sodium 134 (*)    Total Protein 10.4 (*)    Albumin 3.1 (*)    Total Bilirubin 0.2 (*)    All other components within normal limits  CBC WITH DIFFERENTIAL - Abnormal; Notable for the following:    RBC 3.70 (*)    Hemoglobin 10.6 (*)    HCT 30.0 (*)    All other components within normal limits  URINALYSIS, ROUTINE W REFLEX MICROSCOPIC - Abnormal; Notable for the following:    Hgb urine dipstick LARGE (*)    Protein, ur 30 (*)    All other components within normal limits  LIPASE, BLOOD  URINE  MICROSCOPIC-ADD ON   Ct Abdomen Pelvis W Contrast  06/24/2012  *RADIOLOGY REPORT*  Clinical Data: Abdominal pain, headache  CT ABDOMEN AND PELVIS WITH CONTRAST  Technique:  Multidetector CT imaging of the abdomen and pelvis was performed following the standard protocol during bolus administration of intravenous contrast.  Contrast: 100 ml Omnipaque-300 IV  Comparison: 05/05/2009  Findings: Lung bases are clear.  Liver is notable for focal fat/altered perfusion along the falciform (series 2/image 22).  Spleen, pancreas, and adrenal glands are within normal  limits.  Gallbladder is unremarkable.  No intrahepatic or extrahepatic ductal dilatation.  Kidneys are within normal limits.  No hydronephrosis.  No evidence of bowel obstruction.  Normal appendix.  Possible mild wall thickening involving the ascending colon (series 5/image 44).  No evidence of abdominal aortic aneurysm.  No abdominopelvic ascites.  Small retroperitoneal lymph nodes which do not meet pathologic CT size criteria.  Prostate is unremarkable.  Bladder is mildly thick-walled although underdistended.  Mild degenerative changes at L5-S1.  IMPRESSION: No evidence of bowel obstruction.  Normal appendix.  Mild wall thickening involving the ascending colon, equivocal. Short segment infectious/inflammatory colitis is possible.  Bladder is mildly thick-walled although underdistended.   Original Report Authenticated By: Charline Bills, M.D.      1. Headache   2. Abdominal pain    patient is given IV fluids and medications for his headache.  Patient feels improved at this time.  Patient is advised return here for any worsening in his condition.     MDM  MDM Reviewed: nursing note and vitals Interpretation: labs and CT scan            Carlyle Dolly, PA-C 06/26/12 0150

## 2012-06-27 NOTE — ED Provider Notes (Signed)
Medical screening examination/treatment/procedure(s) were performed by non-physician practitioner and as supervising physician I was immediately available for consultation/collaboration.  Raeford Razor, MD 06/27/12 [phone]

## 2012-07-02 ENCOUNTER — Encounter (HOSPITAL_COMMUNITY): Payer: Self-pay | Admitting: *Deleted

## 2012-07-02 ENCOUNTER — Emergency Department (HOSPITAL_COMMUNITY)
Admission: EM | Admit: 2012-07-02 | Discharge: 2012-07-02 | Disposition: A | Discharge status: Home / Self Care | Payer: Self-pay | Attending: Emergency Medicine | Admitting: Emergency Medicine

## 2012-07-02 DIAGNOSIS — K529 Noninfective gastroenteritis and colitis, unspecified: Secondary | ICD-10-CM

## 2012-07-02 DIAGNOSIS — K5289 Other specified noninfective gastroenteritis and colitis: Secondary | ICD-10-CM | POA: Insufficient documentation

## 2012-07-02 DIAGNOSIS — F17200 Nicotine dependence, unspecified, uncomplicated: Secondary | ICD-10-CM | POA: Insufficient documentation

## 2012-07-02 DIAGNOSIS — D649 Anemia, unspecified: Secondary | ICD-10-CM | POA: Insufficient documentation

## 2012-07-02 DIAGNOSIS — R319 Hematuria, unspecified: Secondary | ICD-10-CM | POA: Insufficient documentation

## 2012-07-02 DIAGNOSIS — J45909 Unspecified asthma, uncomplicated: Secondary | ICD-10-CM | POA: Insufficient documentation

## 2012-07-02 LAB — COMPREHENSIVE METABOLIC PANEL
ALT: 11 U/L (ref 0–53)
AST: 20 U/L (ref 0–37)
Albumin: 3 g/dL — ABNORMAL LOW (ref 3.5–5.2)
Alkaline Phosphatase: 60 U/L (ref 39–117)
Glucose, Bld: 83 mg/dL (ref 70–99)
Potassium: 3.6 mEq/L (ref 3.5–5.1)
Sodium: 136 mEq/L (ref 135–145)
Total Protein: 9.8 g/dL — ABNORMAL HIGH (ref 6.0–8.3)

## 2012-07-02 LAB — CBC WITH DIFFERENTIAL/PLATELET
Basophils Absolute: 0 10*3/uL (ref 0.0–0.1)
Basophils Relative: 0 % (ref 0–1)
Eosinophils Absolute: 0.1 10*3/uL (ref 0.0–0.7)
Lymphs Abs: 1.2 10*3/uL (ref 0.7–4.0)
MCH: 28.9 pg (ref 26.0–34.0)
Neutrophils Relative %: 57 % (ref 43–77)
Platelets: 249 10*3/uL (ref 150–400)
RBC: 3.5 MIL/uL — ABNORMAL LOW (ref 4.22–5.81)
RDW: 13.9 % (ref 11.5–15.5)
WBC: 3.9 10*3/uL — ABNORMAL LOW (ref 4.0–10.5)

## 2012-07-02 LAB — URINALYSIS, ROUTINE W REFLEX MICROSCOPIC
Bilirubin Urine: NEGATIVE
Glucose, UA: NEGATIVE mg/dL
Specific Gravity, Urine: 1.015 (ref 1.005–1.030)
pH: 7 (ref 5.0–8.0)

## 2012-07-02 LAB — URINE MICROSCOPIC-ADD ON

## 2012-07-02 MED ORDER — OXYCODONE-ACETAMINOPHEN 5-325 MG PO TABS: 1.0000 | ORAL_TABLET | Freq: Four times a day (QID) | ORAL | Status: DC | PRN

## 2012-07-02 MED ORDER — HYDROMORPHONE HCL PF 1 MG/ML IJ SOLN
1.0000 mg | Freq: Once | INTRAMUSCULAR | Status: AC
  Administered 2012-07-02: 1 mg via INTRAVENOUS
  Filled 2012-07-02: qty 1

## 2012-07-02 MED ORDER — SODIUM CHLORIDE 0.9 % IV BOLUS (SEPSIS)
1000.0000 mL | Freq: Once | INTRAVENOUS | Status: AC
  Administered 2012-07-02: 1000 mL via INTRAVENOUS

## 2012-07-02 MED ORDER — ONDANSETRON HCL 4 MG/2ML IJ SOLN
4.0000 mg | Freq: Once | INTRAMUSCULAR | Status: AC
  Administered 2012-07-02: 4 mg via INTRAVENOUS
  Filled 2012-07-02: qty 2

## 2012-07-02 NOTE — ED Notes (Signed)
Pt c/o abd pain that started a hour ago, denies any fever, n/v/d

## 2012-07-02 NOTE — ED Provider Notes (Signed)
History     CSN: [id number]  Arrival date & time 07/02/12  1346   First MD Initiated Contact with Patient 07/02/12 1411      Chief Complaint  Patient presents with  . Abdominal Pain    (Consider location/radiation/quality/duration/timing/severity/associated sxs/prior treatment) Patient is a 27 y.o. male presenting with abdominal pain.  Abdominal Pain  Pt reports sudden onset of severe cramping R sided abdominal pain after walking to see his father about an hour prior to arrival. No associated vomiting or diarrhea. No fever. He has had similar symptoms intermittently for years, but only occasionally goes to the ED. He was seen for similar, but less severe pain about a week ago at [facility] and had neg workup, including CT Abd/pel at that time.   Past Medical History  Diagnosis Date  . Asthma     History reviewed. No pertinent past surgical history.  No family history on file.  History  Substance Use Topics  . Smoking status: Current Every Day Smoker    Types: Cigarettes  . Smokeless tobacco: Not on file  . Alcohol Use: No      Review of Systems  Gastrointestinal: Positive for abdominal pain.   All other systems reviewed and are negative except as noted in HPI.   Allergies  Peanut-containing drug products  Home Medications   Current Outpatient Rx  Name  Route  Sig  Dispense  Refill  . ibuprofen (ADVIL,MOTRIN) 200 MG tablet   Oral   Take 200 mg by mouth every 6 (six) hours as needed for pain.           BP 125/80  Pulse 69  Temp(Src) 98.4 F (36.9 C) (Oral)  Resp 18  Ht 5\' 7"  (1.702 m)  Wt 195 lb (88.451 kg)  BMI 30.53 kg/m2  SpO2 100%  Physical Exam  Nursing note and vitals reviewed. Constitutional: He is oriented to person, place, and time. He appears well-developed and well-nourished.  HENT:  Head: Normocephalic and atraumatic.  Eyes: EOM are normal. Pupils are equal, round, and reactive to light.  Neck: Normal range of motion. Neck supple.   Cardiovascular: Normal rate, normal heart sounds and intact distal pulses.   Pulmonary/Chest: Effort normal and breath sounds normal.  Abdominal: Bowel sounds are normal. He exhibits no distension. There is tenderness (RUQ and RLQ, moderate). There is no rebound and no guarding.  Musculoskeletal: Normal range of motion. He exhibits no edema and no tenderness.  Clubbing of fingers  Neurological: He is alert and oriented to person, place, and time. He has normal strength. No cranial nerve deficit or sensory deficit.  Skin: Skin is warm and dry. No rash noted.  Psychiatric: He has a normal mood and affect.    ED Course  Procedures (including critical care time)  Labs Reviewed  CBC WITH DIFFERENTIAL - Abnormal; Notable for the following:    WBC 3.9 (*)    RBC 3.50 (*)    Hemoglobin 10.1 (*)    HCT 28.4 (*)    All other components within normal limits  COMPREHENSIVE METABOLIC PANEL - Abnormal; Notable for the following:    BUN 5 (*)    Total Protein 9.8 (*)    Albumin 3.0 (*)    Total Bilirubin 0.1 (*)    All other components within normal limits  URINALYSIS, ROUTINE W REFLEX MICROSCOPIC - Abnormal; Notable for the following:    Hgb urine dipstick LARGE (*)    All other components within normal limits  LIPASE,  BLOOD  URINE MICROSCOPIC-ADD ON   No results found.   No diagnosis found.    MDM  CT from last week showed:  IMPRESSION:  No evidence of bowel obstruction. Normal appendix.  Mild wall thickening involving the ascending colon, equivocal.  Short segment infectious/inflammatory colitis is possible.  Bladder is mildly thick-walled although underdistended.  He has chronic microscopic hematuria going back at least 5 years from our previous visits. CT as above concerning for mild colitis. Suspect that this patient has a mild case of IBD which has flared up intermittently over the last several years, but has never had official diagnosis. Doubt that this is appendicitis.  Chronic anemia likely from gradual lose in urine and/or stool. His pain is well controlled now. He would like to go home, no definite indication for admission at this point but he was strongly encouraged to followup with GI for further evaluation of his symptoms. Advised to return to the ED for any worsening pain, fever, vomiting, bleeding or for any other concerns.       Charles B. Bernette Mayers, MD 07/02/12 [phone]

## 2012-07-02 NOTE — ED Notes (Signed)
Pt presents with upper right abdominal pain x 24 hrs. Pt reports being seen in past week for same c/o with no know etiology. Pt reports had CT scan done but that is all he remembers.  No emesis noted at this time. Denies fever and diarrhea.

## 2012-10-20 ENCOUNTER — Encounter (HOSPITAL_COMMUNITY): Payer: Self-pay | Admitting: Emergency Medicine

## 2012-10-20 ENCOUNTER — Emergency Department (HOSPITAL_COMMUNITY)
Admission: EM | Admit: 2012-10-20 | Discharge: 2012-10-20 | Disposition: A | Discharge status: Home / Self Care | Payer: Self-pay | Attending: Emergency Medicine | Admitting: Emergency Medicine

## 2012-10-20 DIAGNOSIS — L0231 Cutaneous abscess of buttock: Secondary | ICD-10-CM | POA: Insufficient documentation

## 2012-10-20 DIAGNOSIS — J45909 Unspecified asthma, uncomplicated: Secondary | ICD-10-CM | POA: Insufficient documentation

## 2012-10-20 DIAGNOSIS — Z23 Encounter for immunization: Secondary | ICD-10-CM | POA: Insufficient documentation

## 2012-10-20 DIAGNOSIS — F17200 Nicotine dependence, unspecified, uncomplicated: Secondary | ICD-10-CM | POA: Insufficient documentation

## 2012-10-20 DIAGNOSIS — L0291 Cutaneous abscess, unspecified: Secondary | ICD-10-CM

## 2012-10-20 MED ORDER — TETANUS-DIPHTH-ACELL PERTUSSIS 5-2.5-18.5 LF-MCG/0.5 IM SUSP
0.5000 mL | Freq: Once | INTRAMUSCULAR | Status: AC
  Administered 2012-10-20: 0.5 mL via INTRAMUSCULAR
  Filled 2012-10-20: qty 0.5

## 2012-10-20 MED ORDER — OXYCODONE-ACETAMINOPHEN 5-325 MG PO TABS: 2.0000 | ORAL_TABLET | ORAL | Status: DC | PRN

## 2012-10-20 MED ORDER — CLINDAMYCIN HCL 300 MG PO CAPS: 300.0000 mg | ORAL_CAPSULE | Freq: Four times a day (QID) | ORAL | Status: DC

## 2012-10-20 NOTE — ED Provider Notes (Signed)
CSN: [id number]     Arrival date & time 10/20/12  0907 History  This chart was scribed for Phillip Bucco, MD by Bennett Scrape, ED Scribe. This patient was seen in room APA08/APA08 and the patient's care was started at 9:20 AM.   Chief Complaint  Patient presents with  . Abscess    The history is provided by the patient. No language interpreter was used.    HPI Comments: Phillip Morales is a 27 y.o. male who presents to the Emergency Department complaining of 1.5 weeks of gradual onset, gradually worsening, constant right buttock pain described as an ache that he attributes to a possible abscess. 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 He became concerned when he was icing the area this morning and the pain worsened to a constant throbbing pain. He denies pain with BMs or drainage from the area. He denies any recent fevers or emesis. He reports a boil in the same area 2 years ago that resolved after an I&D. TD is not UTD.   Past Medical History  Diagnosis Date  . Asthma    History reviewed. No pertinent past surgical history. No family history on file. History  Substance Use Topics  . Smoking status: Current Every Day Smoker    Types: Cigarettes  . Smokeless tobacco: Not on file  . Alcohol Use: No    Review of Systems  Constitutional: Negative for fever, chills, diaphoresis and fatigue.  HENT: Negative for congestion, rhinorrhea and sneezing.   Eyes: Negative.   Respiratory: Negative for cough, chest tightness and shortness of breath.   Cardiovascular: Negative for chest pain and leg swelling.  Gastrointestinal: Negative for nausea, vomiting, abdominal pain, diarrhea and blood in stool.  Genitourinary: Negative for frequency, hematuria, flank pain and difficulty urinating.  Musculoskeletal: Negative for back pain and arthralgias.  Skin: Positive for wound. Negative for rash.       Possible abscess to the right buttock   Neurological: Negative for dizziness, speech difficulty, weakness, numbness and headaches.  All other systems reviewed and are negative.    Allergies  Peanut-containing drug products  Home Medications   Current Outpatient Rx  Name  Route  Sig  Dispense  Refill  . ibuprofen (ADVIL,MOTRIN) 200 MG tablet   Oral   Take 600 mg by mouth every 6 (six) hours as needed for pain.         . clindamycin (CLEOCIN) 300 MG capsule   Oral   Take 1 capsule (300 mg total) by mouth 4 (four) times daily. X 10 days   40 capsule   0   . oxyCODONE-acetaminophen (PERCOCET) 5-325 MG per tablet   Oral   Take 2 tablets by mouth every 4 (four) hours as needed for pain.   20 tablet   0    Triage Vitals: BP 159/95  Pulse 92  Temp(Src) 99.9 F (37.7 C)  Resp 18  Ht 5\' 7"  (1.702 m)  Wt 173 lb (78.472 kg)  BMI 27.09 kg/m2  SpO2 99%  Physical Exam  Nursing note and vitals reviewed. Constitutional: He is oriented to person, place, and time. He appears well-developed and well-nourished.  HENT:  Head: Normocephalic and atraumatic.  Neck: Normal range of motion. Neck supple.  Cardiovascular: Normal rate.   Pulmonary/Chest: Effort normal.  Musculoskeletal: He exhibits tenderness.  3 cm area of firmness to the inner aspect of the right buttocks, no induration or fluctuance, no  erythema, no tenderness to the perirectal area, no pain on rectal exam, chaperone present  Neurological: He is alert and oriented to person, place, and time.  Skin: Skin is warm and dry.  Psychiatric: He has a normal mood and affect.    ED Course   Procedures (including critical care time)  DIAGNOSTIC STUDIES: Oxygen Saturation is 99% on room air, normal by my interpretation.    COORDINATION OF CARE: 9:26 AM-Informed pt that the area is not ready for an I&D. Discussed discharge plan which includes warm soaks with pt and pt agreed to plan. Also advised pt to follow up in 2 days and pt agreed. Addressed symptoms to  return for sooner with pt.   Labs Reviewed - No data to display No results found. 1. Abscess     MDM  Pt with early abscess to right buttocks.  Is not amenable to I&D at this point.  No evident involvement in perirectal area.  Advised warm soaks, start abx, rx for pain meds.  F/u in 2 days for recheck and possible I&D at that point  I personally performed the services described in this documentation, which was scribed in my presence.  The recorded information has been reviewed and considered.    Phillip Bucco, MD 10/20/12 [phone]

## 2012-10-20 NOTE — ED Notes (Signed)
Pt c/o abscess to right buttock.

## 2012-10-22 ENCOUNTER — Encounter (HOSPITAL_COMMUNITY): Payer: Self-pay | Admitting: Emergency Medicine

## 2012-10-22 ENCOUNTER — Inpatient Hospital Stay (HOSPITAL_COMMUNITY)
Admission: EM | Admit: 2012-10-22 | Discharge: 2012-10-24 | DRG: 602 | Disposition: A | Discharge status: Home Health | Payer: Self-pay | Attending: Internal Medicine | Admitting: Internal Medicine

## 2012-10-22 DIAGNOSIS — L0231 Cutaneous abscess of buttock: Principal | ICD-10-CM | POA: Diagnosis present

## 2012-10-22 DIAGNOSIS — E871 Hypo-osmolality and hyponatremia: Secondary | ICD-10-CM | POA: Diagnosis present

## 2012-10-22 DIAGNOSIS — D72829 Elevated white blood cell count, unspecified: Secondary | ICD-10-CM | POA: Diagnosis present

## 2012-10-22 DIAGNOSIS — D649 Anemia, unspecified: Secondary | ICD-10-CM | POA: Diagnosis present

## 2012-10-22 DIAGNOSIS — L0291 Cutaneous abscess, unspecified: Secondary | ICD-10-CM

## 2012-10-22 DIAGNOSIS — F17200 Nicotine dependence, unspecified, uncomplicated: Secondary | ICD-10-CM | POA: Diagnosis present

## 2012-10-22 DIAGNOSIS — L03317 Cellulitis of buttock: Principal | ICD-10-CM | POA: Diagnosis present

## 2012-10-22 DIAGNOSIS — B20 Human immunodeficiency virus [HIV] disease: Secondary | ICD-10-CM | POA: Diagnosis present

## 2012-10-22 DIAGNOSIS — J45909 Unspecified asthma, uncomplicated: Secondary | ICD-10-CM | POA: Diagnosis present

## 2012-10-22 LAB — CBC WITH DIFFERENTIAL/PLATELET
Basophils Relative: 0 % (ref 0–1)
Eosinophils Absolute: 0 10*3/uL (ref 0.0–0.7)
Eosinophils Relative: 0 % (ref 0–5)
Hemoglobin: 9.8 g/dL — ABNORMAL LOW (ref 13.0–17.0)
Lymphocytes Relative: 9 % — ABNORMAL LOW (ref 12–46)
Monocytes Absolute: 1.1 10*3/uL — ABNORMAL HIGH (ref 0.1–1.0)
Neutrophils Relative %: 81 % — ABNORMAL HIGH (ref 43–77)
Platelets: 216 10*3/uL (ref 150–400)
RBC: 3.45 MIL/uL — ABNORMAL LOW (ref 4.22–5.81)
WBC Morphology: INCREASED

## 2012-10-22 LAB — BASIC METABOLIC PANEL
CO2: 26 mEq/L (ref 19–32)
Calcium: 8.9 mg/dL (ref 8.4–10.5)
Chloride: 95 mEq/L — ABNORMAL LOW (ref 96–112)
Creatinine, Ser: 0.92 mg/dL (ref 0.50–1.35)
GFR calc Af Amer: >90 mL/min (ref >90)
Sodium: 130 mEq/L — ABNORMAL LOW (ref 135–145)

## 2012-10-22 LAB — RETICULOCYTES: RBC.: 3.41 MIL/uL — ABNORMAL LOW (ref 4.22–5.81)

## 2012-10-22 MED ORDER — KETAMINE HCL 10 MG/ML IJ SOLN
INTRAMUSCULAR | Status: AC | PRN
  Administered 2012-10-22: 80 mg via INTRAVENOUS
  Administered 2012-10-22: 40 mg via INTRAVENOUS

## 2012-10-22 MED ORDER — VANCOMYCIN HCL IN DEXTROSE 1-5 GM/200ML-% IV SOLN
1000.0000 mg | Freq: Three times a day (TID) | INTRAVENOUS | Status: DC
  Administered 2012-10-23 – 2012-10-24 (×5): 1000 mg via INTRAVENOUS
  Filled 2012-10-22 (×5): qty 200

## 2012-10-22 MED ORDER — OXYCODONE-ACETAMINOPHEN 5-325 MG PO TABS
2.0000 | ORAL_TABLET | ORAL | Status: DC | PRN
  Administered 2012-10-22 – 2012-10-24 (×9): 2 via ORAL
  Filled 2012-10-22 (×9): qty 2

## 2012-10-22 MED ORDER — PIPERACILLIN-TAZOBACTAM 3.375 G IVPB
3.3750 g | Freq: Three times a day (TID) | INTRAVENOUS | Status: DC
  Administered 2012-10-22 – 2012-10-24 (×5): 3.375 g via INTRAVENOUS
  Filled 2012-10-22 (×6): qty 50

## 2012-10-22 MED ORDER — PIPERACILLIN-TAZOBACTAM 3.375 G IVPB
INTRAVENOUS | Status: AC
  Filled 2012-10-22: qty 100

## 2012-10-22 MED ORDER — HEPARIN SODIUM (PORCINE) 5000 UNIT/ML IJ SOLN: 5000.0000 [IU] | Freq: Three times a day (TID) | INTRAMUSCULAR | Status: DC

## 2012-10-22 MED ORDER — HEPARIN SODIUM (PORCINE) 5000 UNIT/ML IJ SOLN
5000.0000 [IU] | Freq: Three times a day (TID) | INTRAMUSCULAR | Status: DC
  Administered 2012-10-22 – 2012-10-24 (×5): 5000 [IU] via SUBCUTANEOUS
  Filled 2012-10-22 (×5): qty 1

## 2012-10-22 MED ORDER — VANCOMYCIN HCL IN DEXTROSE 1-5 GM/200ML-% IV SOLN
INTRAVENOUS | Status: AC
  Filled 2012-10-22: qty 200

## 2012-10-22 MED ORDER — FENTANYL CITRATE 0.05 MG/ML IJ SOLN
INTRAMUSCULAR | Status: AC
  Filled 2012-10-22: qty 2

## 2012-10-22 MED ORDER — SODIUM CHLORIDE 0.9 % IV SOLN
INTRAVENOUS | Status: DC
  Administered 2012-10-22 – 2012-10-24 (×2): via INTRAVENOUS

## 2012-10-22 MED ORDER — PNEUMOCOCCAL VAC POLYVALENT 25 MCG/0.5ML IJ INJ
0.5000 mL | INJECTION | INTRAMUSCULAR | Status: AC
  Filled 2012-10-22: qty 0.5

## 2012-10-22 MED ORDER — VANCOMYCIN HCL IN DEXTROSE 1-5 GM/200ML-% IV SOLN
1000.0000 mg | Freq: Once | INTRAVENOUS | Status: AC
  Administered 2012-10-22: 1000 mg via INTRAVENOUS
  Filled 2012-10-22: qty 200

## 2012-10-22 MED ORDER — KETAMINE HCL 50 MG/ML IJ SOLN
1.0000 mg/kg | Freq: Once | INTRAMUSCULAR | Status: DC
  Filled 2012-10-22: qty 1

## 2012-10-22 MED ORDER — FENTANYL CITRATE 0.05 MG/ML IJ SOLN
INTRAMUSCULAR | Status: AC | PRN
  Administered 2012-10-22: 50 ug via INTRAVENOUS

## 2012-10-22 MED ORDER — LIDOCAINE HCL (PF) 1 % IJ SOLN
INTRAMUSCULAR | Status: AC
  Filled 2012-10-22: qty 5

## 2012-10-22 MED ORDER — DEXTROSE 5 % IV SOLN
1.0000 g | Freq: Once | INTRAVENOUS | Status: AC
  Administered 2012-10-22: 1 g via INTRAVENOUS
  Filled 2012-10-22: qty 10

## 2012-10-22 NOTE — ED Notes (Signed)
Patient sipping on water and eating saltine cracker. Conversing with Father in room. No distress.

## 2012-10-22 NOTE — H&P (Addendum)
[facility] History and Physical  RONOLD HARDGROVE ZOX:[id number] DOB: Jan 06, 1986 DOA: 10/22/2012  Referring physician: Dr. Rosalia Hammers. PCP: No PCP Per Patient  Specialists: None.  Chief Complaint: Right buttock abscess  HPI: Phillip Morales is a 27 y.o. male who presents to the emergency room having already been seen in the ER couple of days ago with her right buttock swelling and pain. He was diagnosed with an abscess and was prescribed antibiotics. Unfortunately, he did not take this course of antibiotics and he now presents again with worsening swelling. He was evaluated by the ER physician who is performed an incision and drainage under sedation. He is now being admitted for intravenous antibiotics and further care.   Review of Systems:  Apart from history of present illness, other systems negative  Past Medical History  Diagnosis Date  . Asthma    History reviewed. No pertinent past surgical history. Social History:  reports that he has been smoking Cigarettes.  He has been smoking about 0.00 packs per day. He does not have any smokeless tobacco history on file. He reports that he does not drink alcohol or use illicit drugs. Works as a Tourist information centre manager.   Allergies  Allergen Reactions  . Peanut-Containing Drug Products Nausea And Vomiting    No family history on file. noncontributory  Prior to Admission medications   Medication Sig Start Date End Date Taking? Authorizing Provider  ibuprofen (ADVIL,MOTRIN) 200 MG tablet Take 600 mg by mouth every 6 (six) hours as needed for pain.   Yes Historical Provider, MD  clindamycin (CLEOCIN) 300 MG capsule Take 1 capsule (300 mg total) by mouth 4 (four) times daily. X 10 days 10/20/12   Rolan Bucco, MD  oxyCODONE-acetaminophen (PERCOCET) 5-325 MG per tablet Take 2 tablets by mouth every 4 (four) hours as needed for pain. 10/20/12   Rolan Bucco, MD   Physical Exam: Filed Vitals:   10/22/12 1654  BP: 146/65  Pulse: 97  Temp:   Resp: 24      General:  Looks systemically well. He does not look toxic or septic.  Eyes: No pallor. No jaundice.  ENT: No abnormalities.  Neck: No lymphadenopathy.  Cardiovascular: Heart sounds are present without murmurs or added sounds.  Respiratory: Lung fields are clear.  Abdomen: Soft, nontender.  Skin: Left gluteal abscess which is now packed. Status post incision and drainage.  Musculoskeletal: No acute joint abnormalities.  Psychiatric: Appropriate affect.  Neurologic: Alert and orientated without any focal neurological signs.  Labs on Admission:  Basic Metabolic Panel:  Recent Labs Lab 10/22/12 1518  NA 130*  K 3.4*  CL 95*  CO2 26  GLUCOSE 108*  BUN 9  CREATININE 0.92  CALCIUM 8.9      Recent Labs Lab 10/22/12 1518  WBC 11.2*  NEUTROABS 9.1*  HGB 9.8*  HCT 27.6*  MCV 80.0  PLT 216         Assessment/Plan   1. Left gluteal abscess, status post incision and drainage. 2. Anemia, normocytic, unclear etiology.  Plan: 1. Admit to medical/surgical floor. 2. Intravenous antibiotics. 3. Surgical consultation for further management. 4. Check anemia panel. Further recommendations will depend on patient's hospital progress.   Code Status: Full code.  Family Communication: Discussed plan with patient at the bedside.   Disposition Plan: Home when medically stable.   Time spent: 30 minutes.  Wilson Singer [facility] Pager [phone].  If 7PM-7AM, please contact night-coverage [email or web address] Password [email or web address] 10/22/2012, 5:03 PM

## 2012-10-22 NOTE — ED Provider Notes (Addendum)
This is a shared visit with Dr. Algis Downs. Alaysia Lightle  INCISION AND DRAINAGE Performed by: Kerrie Buffalo Consent: Verbal consent obtained. Risks and benefits: risks, benefits and alternatives were discussed Type: abscess  Body area: right buttock  Anesthesia: local infiltration  Local anesthetic: lidocaine 1% without epinephrine  Anesthetic total: 5  Ml  Patient also given IV sedation by Dr. Rosalia Hammers (see her note)  Incision with # 11 blade  Complexity: complex Blunt dissection to break up loculations  Irrigated with NSS  Drainage: purulent  Drainage amount: LARGE  Drain inserted and one suture to hold in place.   Patient tolerance: Patient tolerated the procedure well with no immediate complications.     Hope Orlene Och, NP 10/22/12 1633   I performed a history and physical examination of [name] and discussed his management with Ms. [facility].  I agree with the history, physical, assessment, and plan of care, with the following exceptions: None  I was present for the following procedures: None Time Spent in Critical Care of the patient: None Time spent in discussions with the patient and family: 69  Tanganyika Bowlds S    Hilario Quarry, MD 11/01/12 1511  Hilario Quarry, MD 11/01/12 1511

## 2012-10-22 NOTE — ED Notes (Signed)
Patient arousable. Oriented x 4. VSS.

## 2012-10-22 NOTE — ED Notes (Signed)
Attempted to call report. RN, Maralyn Sago, to call back.

## 2012-10-22 NOTE — ED Notes (Signed)
States that he has had an abscess on his right buttock for 4-5 days.  States he was evaluated for the same problem here in the ED and has not filled his prescriptions.

## 2012-10-22 NOTE — ED Notes (Signed)
Report given to  Sarah, RN.

## 2012-10-22 NOTE — ED Provider Notes (Signed)
CSN: [id number]     Arrival date & time 10/22/12  1347 History  This chart was scribed for Hilario Quarry, MD by Bennett Scrape, ED Scribe. This patient was seen in room APA01/APA01 and the patient's care was started at 3:07 PM.   Chief Complaint  Patient presents with  . Abscess    Patient is a 27 y.o. male presenting with abscess. The history is provided by the patient. No language interpreter was used.  Abscess Location:  Ano-genital Ano-genital abscess location:  R buttock Abscess quality: painful   Abscess quality: not draining   Duration:  4 days Progression:  Worsening Associated symptoms: fever   Associated symptoms: no nausea and no vomiting      HPI Comments: EPHRAIM REICHEL is a 27 y.o. male who presents to the Emergency Department complaining of 4 days of right buttock abscess. Pt was seen for the same 2 days ago and given antibiotics but did not fill the prescription. He denies any known fevers at home, but temperature in the ED is 100.5. He has tried boil ease for the symptoms with no improvement. He states that he had a similar abscess to the left buttocks several years ago but denies having a h/o frequent abscesses. He has a h/o asthma but denies being on any daily medications. He has a h/o asthma but is not on any daily medications.  Past Medical History  Diagnosis Date  . Asthma    History reviewed. No pertinent past surgical history. No family history on file. History  Substance Use Topics  . Smoking status: Current Every Day Smoker    Types: Cigarettes  . Smokeless tobacco: Not on file  . Alcohol Use: No    Review of Systems  Constitutional: Positive for fever. Negative for chills.  Gastrointestinal: Negative for nausea and vomiting.  Skin: Positive for wound.  All other systems reviewed and are negative.    Allergies  Peanut-containing drug products  Home Medications   Current Outpatient Rx  Name  Route  Sig  Dispense  Refill  . clindamycin  (CLEOCIN) 300 MG capsule   Oral   Take 1 capsule (300 mg total) by mouth 4 (four) times daily. X 10 days   40 capsule   0   . ibuprofen (ADVIL,MOTRIN) 200 MG tablet   Oral   Take 600 mg by mouth every 6 (six) hours as needed for pain.         Marland Kitchen oxyCODONE-acetaminophen (PERCOCET) 5-325 MG per tablet   Oral   Take 2 tablets by mouth every 4 (four) hours as needed for pain.   20 tablet   0    Triage Vitals: BP 175/95  Pulse 99  Temp(Src) 100.5 F (38.1 C) (Oral)  Resp 20  Wt 173 lb (78.472 kg)  BMI 27.09 kg/m2  SpO2 100%  Physical Exam  Nursing note and vitals reviewed. Constitutional: He is oriented to person, place, and time. He appears well-developed and well-nourished. No distress.  HENT:  Head: Normocephalic and atraumatic.  Eyes: Conjunctivae and EOM are normal.  Neck: Normal range of motion. No tracheal deviation present.  Cardiovascular: Normal rate.   Pulmonary/Chest: Effort normal. No respiratory distress.  Genitourinary:     Right peri gluteal abscess that is 10 cm by 5 cm, fluctuant and indurated, the area is tender to palpation, rectal exam had no bulging, chaperone present  Musculoskeletal: Normal range of motion.  Neurological: He is alert and oriented to person, place, and time.  Skin: Skin is warm and dry.  Psychiatric: He has a normal mood and affect. His behavior is normal. Judgment and thought content normal.    ED Course   Procedures (including critical care time)  DIAGNOSTIC STUDIES: Oxygen Saturation is 100% on room air, normal by my interpretation.    COORDINATION OF CARE: 3:11 PM-Discussed treatment plan which includes 1g rocephin, 1g vancomycin, CBC panel and BMP with pt at bedside and pt agreed to plan. Will sedate with 1 mg Ketamine.  3:52 PM-Preprocedure  Pre-anesthesia/induction confirmation of laterality/correct procedure site including "time-out."  Provider confirms review of the nurses' note, allergies, medications, pertinent  labs, PMH, pre-induction vital signs, pulse oximetry, pain level, and ECG (as applicable), and patient condition satisfactory for commencing with order for sedation and procedure.  4:07 PM-Ketamine administered.   4:15 PM-Pt spoke during procedure.   4:16 PM-40 mg Ketamine administered in the right arm.  4:21 PM-Pt spoke again during procedure.  4:25 PM-Rectal exam performed by PA who performed the I&D. No rectal deficit noted.  4:28 PM-Pt is arousable and answering questions.  4:30 PM-Spoke with pt's father and informed him of anemia. Father states that pt's mother may have had a h/o sickle cell trait. He denies any prior sickle cell diagnoses with the pt.  Labs Reviewed - No data to display No results found. No diagnosis found.  MDM  27 y.o. Male with right perirectal abscess presents with fever and elevated wbc.  I and d done with conscious sedation.  Patient tolerated procedure well.  Please see notes for i and d and sedation.  Patient given rocephin and vancomycin in ed. Plan observation tonight.   Patient also noted to have anemia with hemoglobin decreasing from 13 in 2011 to 9 today.  Patient asymptomatic.   Discussed with Dr. Roselee Nova andpatient to be placed in observation status and have surgical consult.   Hilario Quarry, MD 10/22/12 [phone]

## 2012-10-22 NOTE — Progress Notes (Signed)
ANTIBIOTIC CONSULT NOTE - INITIAL  Pharmacy Consult for Vancomycin & Zosyn Indication: Gluteal abscess, possibly MRSA   Allergies  Allergen Reactions  . Peanut-Containing Drug Products Nausea And Vomiting    Patient Measurements: Weight: 173 lb (78.472 kg)   Vital Signs: Temp: 100.5 F (38.1 C) (08/10 1352) Temp src: Oral (08/10 1352) BP: 143/73 mmHg (08/10 1722) Pulse Rate: 92 (08/10 1722) Intake/Output from previous day:   Intake/Output from this shift:    Labs:  Recent Labs  10/22/12 1518  WBC 11.2*  HGB 9.8*  PLT 216  CREATININE 0.92   The CrCl is unknown because both a height and weight (above a minimum accepted value) are required for this calculation. No results found for this basename: VANCOTROUGH, VANCOPEAK, VANCORANDOM, GENTTROUGH, GENTPEAK, GENTRANDOM, TOBRATROUGH, TOBRAPEAK, TOBRARND, AMIKACINPEAK, AMIKACINTROU, AMIKACIN,  in the last 72 hours   Microbiology: No results found for this or any previous visit (from the past 720 hour(s)).  Medical History: Past Medical History  Diagnosis Date  . Asthma     Medications:  Scheduled:  . heparin  5,000 Units Subcutaneous Q8H  . piperacillin-tazobactam (ZOSYN)  IV  3.375 g Intravenous Q8H  . [START ON 10/23/2012] vancomycin  1,000 mg Intravenous Q8H   Assessment: Vancomycin 1 GM IV given in ED SCR 0.92 Calculated CrCl 110 ml/min  Goal of Therapy:  Vancomycin trough level 15-20 mcg/ml  Plan:  Vancomycin 1 GM IV every 8 hours Zosyn 3.375 GM IV every 8 hours, infused over 4 hours Vancomycin trough at steady state Monitor renal function Labs per protocol  Phillip Morales, Phillip Morales 10/22/2012,5:59 PM

## 2012-10-23 DIAGNOSIS — D649 Anemia, unspecified: Secondary | ICD-10-CM | POA: Diagnosis present

## 2012-10-23 DIAGNOSIS — D72829 Elevated white blood cell count, unspecified: Secondary | ICD-10-CM | POA: Diagnosis present

## 2012-10-23 DIAGNOSIS — E871 Hypo-osmolality and hyponatremia: Secondary | ICD-10-CM | POA: Diagnosis present

## 2012-10-23 LAB — CBC
MCV: 80.5 fL (ref 78.0–100.0)
Platelets: 205 10*3/uL (ref 150–400)
RDW: 13 % (ref 11.5–15.5)
WBC: 10 10*3/uL (ref 4.0–10.5)

## 2012-10-23 LAB — COMPREHENSIVE METABOLIC PANEL
Alkaline Phosphatase: 92 U/L (ref 39–117)
BUN: 6 mg/dL (ref 6–23)
CO2: 28 mEq/L (ref 19–32)
Chloride: 98 mEq/L (ref 96–112)
GFR calc Af Amer: >90 mL/min (ref >90)
GFR calc non Af Amer: >90 mL/min (ref >90)
Glucose, Bld: 96 mg/dL (ref 70–99)
Potassium: 3.1 mEq/L — ABNORMAL LOW (ref 3.5–5.1)
Total Bilirubin: 0.4 mg/dL (ref 0.3–1.2)

## 2012-10-23 LAB — FOLATE: Folate: 17.4 ng/mL

## 2012-10-23 LAB — IRON AND TIBC: UIBC: 215 ug/dL (ref 125–400)

## 2012-10-23 LAB — VITAMIN B12: Vitamin B-12: 522 pg/mL (ref 211–911)

## 2012-10-23 MED ORDER — POTASSIUM CHLORIDE CRYS ER 20 MEQ PO TBCR
40.0000 meq | EXTENDED_RELEASE_TABLET | ORAL | Status: AC
  Administered 2012-10-23 (×2): 40 meq via ORAL
  Filled 2012-10-23 (×2): qty 2

## 2012-10-23 NOTE — Plan of Care (Signed)
Problem: Phase I Progression Outcomes Goal: Other Phase I Outcomes/Goals Outcome: Completed/Met Date Met:  10/23/12 Diagnosis Right gluteal abscess I&D performed in ED with penrose drain in place.

## 2012-10-23 NOTE — Consult Note (Signed)
Reason for Consult: Right gluteal abscess Referring Physician: [facility]  Phillip Morales is an 27 y.o. male.  HPI: Phillip Morales is a 27 year old black male who presented emergency room with a right gluteal abscess. I&D with a Penrose drain placement was performed in the emergency room. I've been asked to continue wound care.  Past Medical History  Diagnosis Date  . Asthma     History reviewed. No pertinent past surgical history.  History reviewed. No pertinent family history.  Social History:  reports that he has been smoking Cigarettes.  He has been smoking about 0.00 packs per day. He does not have any smokeless tobacco history on file. He reports that he does not drink alcohol or use illicit drugs.  Allergies:  Allergies  Allergen Reactions  . Peanut-Containing Drug Products Nausea And Vomiting    Medications: I have reviewed the Phillip Morales's current medications.  Results for orders placed during the hospital encounter of 10/22/12 (from the past 48 hour(s))  CBC WITH DIFFERENTIAL     Status: Abnormal   Collection Time    10/22/12  3:18 PM      Result Value Range   WBC 11.2 (*) 4.0 - 10.5 K/uL   RBC 3.45 (*) 4.22 - 5.81 MIL/uL   Hemoglobin 9.8 (*) 13.0 - 17.0 g/dL   HCT 45.4 (*) 09.8 - 11.9 %   MCV 80.0  78.0 - 100.0 fL   MCH 28.4  26.0 - 34.0 pg   MCHC 35.5  30.0 - 36.0 g/dL   RDW 14.7  82.9 - 56.2 %   Platelets 216  150 - 400 K/uL   Neutrophils Relative % 81 (*) 43 - 77 %   Lymphocytes Relative 9 (*) 12 - 46 %   Monocytes Relative 10  3 - 12 %   Eosinophils Relative 0  0 - 5 %   Basophils Relative 0  0 - 1 %   Neutro Abs 9.1 (*) 1.7 - 7.7 K/uL   Lymphs Abs 1.0  0.7 - 4.0 K/uL   Monocytes Absolute 1.1 (*) 0.1 - 1.0 K/uL   Eosinophils Absolute 0.0  0.0 - 0.7 K/uL   Basophils Absolute 0.0  0.0 - 0.1 K/uL   RBC Morphology POLYCHROMASIA PRESENT     Comment: TARGET CELLS   WBC Morphology INCREASED BANDS (>20% BANDS)     Comment: ATYPICAL LYMPHOCYTES     TOXIC  GRANULATION     DOHLE BODIES  BASIC METABOLIC PANEL     Status: Abnormal   Collection Time    10/22/12  3:18 PM      Result Value Range   Sodium 130 (*) 135 - 145 mEq/L   Potassium 3.4 (*) 3.5 - 5.1 mEq/L   Chloride 95 (*) 96 - 112 mEq/L   CO2 26  19 - 32 mEq/L   Glucose, Bld 108 (*) 70 - 99 mg/dL   BUN 9  6 - 23 mg/dL   Creatinine, Ser 1.30  0.50 - 1.35 mg/dL   Calcium 8.9  8.4 - 86.5 mg/dL   GFR calc non Af Amer >90  >90 mL/min   GFR calc Af Amer >90  >90 mL/min   Comment:            The eGFR has been calculated     using the CKD EPI equation.     This calculation has not been     validated in all clinical     situations.     eGFR's persistently     <  90 mL/min signify     possible Chronic Kidney Disease.  RETICULOCYTES     Status: Abnormal   Collection Time    10/22/12  5:08 PM      Result Value Range   Retic Ct Pct 1.0  0.4 - 3.1 %   RBC. 3.41 (*) 4.22 - 5.81 MIL/uL   Retic Count, Manual 34.1  19.0 - 186.0 K/uL  COMPREHENSIVE METABOLIC PANEL     Status: Abnormal   Collection Time    10/23/12  5:35 AM      Result Value Range   Sodium 132 (*) 135 - 145 mEq/L   Potassium 3.1 (*) 3.5 - 5.1 mEq/L   Chloride 98  96 - 112 mEq/L   CO2 28  19 - 32 mEq/L   Glucose, Bld 96  70 - 99 mg/dL   BUN 6  6 - 23 mg/dL   Creatinine, Ser 1.61  0.50 - 1.35 mg/dL   Calcium 8.4  8.4 - 09.6 mg/dL   Total Protein 9.6 (*) 6.0 - 8.3 g/dL   Albumin 2.4 (*) 3.5 - 5.2 g/dL   AST 16  0 - 37 U/L   ALT 9  0 - 53 U/L   Alkaline Phosphatase 92  39 - 117 U/L   Total Bilirubin 0.4  0.3 - 1.2 mg/dL   GFR calc non Af Amer >90  >90 mL/min   GFR calc Af Amer >90  >90 mL/min   Comment:            The eGFR has been calculated     using the CKD EPI equation.     This calculation has not been     validated in all clinical     situations.     eGFR's persistently     <90 mL/min signify     possible Chronic Kidney Disease.  CBC     Status: Abnormal   Collection Time    10/23/12  5:35 AM      Result  Value Range   WBC 10.0  4.0 - 10.5 K/uL   RBC 3.18 (*) 4.22 - 5.81 MIL/uL   Hemoglobin 9.0 (*) 13.0 - 17.0 g/dL   HCT 04.5 (*) 40.9 - 81.1 %   MCV 80.5  78.0 - 100.0 fL   MCH 28.3  26.0 - 34.0 pg   MCHC 35.2  30.0 - 36.0 g/dL   RDW 91.4  78.2 - 95.6 %   Platelets 205  150 - 400 K/uL    No results found.  ROS: See chart Blood pressure 130/77, pulse 84, temperature 98.8 F (37.1 C), temperature source Oral, resp. rate 20, height 5\' 7"  (1.702 m), weight 80.74 kg (178 lb), SpO2 100.00%. Physical Exam:  Pleasant black male in no acute distress. Buttock examination reveals a draining right gluteal abscess with a Penrose drain in place. Mild induration noted. No new fluctuant area noted.  Assessment/Plan: Impression: Right gluteal abscess, treated appropriately with incision and drainage. Plan: Continue current wound care. We'll follow with you.  [name] A 10/23/2012, 11:14 AM

## 2012-10-23 NOTE — Progress Notes (Signed)
[facility] PROGRESS NOTE  Phillip Morales NWG:[id number] DOB: 1986/03/09 DOA: 10/22/2012 PCP: No PCP Per Patient  Assessment/Plan: 1. R gluteal abscess, status post incision and drainage. Pinrose drains intact draining moderate amount serosanguinous drainage. Malodorous. Pt afebrile and non-toxic appearing. White count trending down. Await surgical consult. Vancomycin and Zosy day #2.  2. Anemia, normocytic, unclear etiology. Anemia panel pending.  3. Leukocytosis: related to #1. Resolved. Continue antibiotics as above 4. Hyponatremia: mild. related to decreased po intake. Trending up. Continue IV fluids. Will decrease rate as pt taking po's without problem*  Code Status: full Family Communication: none present Disposition Plan: home when ready hopefully tomorrow   Consultants:  General surgery  Procedures:  I&D of gluteal abscess 10/22/12  Antibiotics:  Vancomycin 10/22/12>>  Zosyn 10/22/12 >>>  HPI/Subjective: Awake alert. Reports pain in left buttock. Denies chills, nausea/vomiting  Objective: Filed Vitals:   10/23/12 0603  BP: 130/77  Pulse: 84  Temp: 98.8 F (37.1 C)  Resp: 20    Intake/Output Summary (Last 24 hours) at 10/23/12 0919 Last data filed at 10/23/12 0537  Gross per 24 hour  Intake 2062.5 ml  Output   1075 ml  Net  987.5 ml   Filed Weights   10/22/12 1352 10/22/12 1722  Weight: 173 lb (78.472 kg) 178 lb (80.74 kg)    Exam:   General:  Well nourished NAD  Cardiovascular: RRR No MGR No LE edema  Respiratory: normal effort BS clear bilaterally  Abdomen: round +BS non-tender to palpation  Musculoskeletal: no clubbing no cyanosis, MAE  Skin: right glute inner aspect with pinrose drain intact, draining serosanguinous drainage. Very tender to touch. swelling  Data Reviewed: Basic Metabolic Panel:  Recent Labs Lab 10/22/12 1518 10/23/12 0535  NA 130* 132*  K 3.4* 3.1*  CL 95* 98  CO2 26 28  GLUCOSE 108* 96  BUN 9 6  CREATININE  0.92 0.89  CALCIUM 8.9 8.4   Liver Function Tests:  Recent Labs Lab 10/23/12 0535  AST 16  ALT 9  ALKPHOS 92  BILITOT 0.4  PROT 9.6*  ALBUMIN 2.4*   No results found for this basename: LIPASE, AMYLASE,  in the last 168 hours No results found for this basename: AMMONIA,  in the last 168 hours CBC:  Recent Labs Lab 10/22/12 1518 10/23/12 0535  WBC 11.2* 10.0  NEUTROABS 9.1*  --   HGB 9.8* 9.0*  HCT 27.6* 25.6*  MCV 80.0 80.5  PLT 216 205   Cardiac Enzymes: No results found for this basename: CKTOTAL, CKMB, CKMBINDEX, TROPONINI,  in the last 168 hours BNP (last 3 results) No results found for this basename: PROBNP,  in the last 8760 hours CBG: No results found for this basename: GLUCAP,  in the last 168 hours  No results found for this or any previous visit (from the past 240 hour(s)).   Studies: No results found.  Scheduled Meds: . heparin  5,000 Units Subcutaneous Q8H  . piperacillin-tazobactam (ZOSYN)  IV  3.375 g Intravenous Q8H  . pneumococcal 23 valent vaccine  0.5 mL Intramuscular Tomorrow-1000  . potassium chloride  40 mEq Oral Q4H  . vancomycin  1,000 mg Intravenous Q8H   Continuous Infusions: . sodium chloride 125 mL/hr at 10/22/12 1531    Active Problems:   Gluteal abscess   Anemia   Leukocytosis, unspecified   Hyponatremia    Time spent: 30 minutes    [facility]  [facility] Pager [phone]. If 7PM-7AM, please contact night-coverage at  [email or web address], password [email or web address] 10/23/2012, 9:19 AM  LOS: 1 day   Attending: Patient is doing well post incision and drainage. He continues on IV antibiotics. Appreciate surgical consultation. Hopefully, he can be discharged home tomorrow. Await HIV status.

## 2012-10-24 DIAGNOSIS — B20 Human immunodeficiency virus [HIV] disease: Secondary | ICD-10-CM

## 2012-10-24 LAB — BASIC METABOLIC PANEL WITH GFR
BUN: 4 mg/dL — ABNORMAL LOW (ref 6–23)
CO2: 32 meq/L (ref 19–32)
Calcium: 9 mg/dL (ref 8.4–10.5)
Chloride: 98 meq/L (ref 96–112)
Creatinine, Ser: 0.89 mg/dL (ref 0.50–1.35)
GFR calc Af Amer: >90 mL/min
GFR calc non Af Amer: >90 mL/min
Glucose, Bld: 105 mg/dL — ABNORMAL HIGH (ref 70–99)
Potassium: 3.7 meq/L (ref 3.5–5.1)
Sodium: 133 meq/L — ABNORMAL LOW (ref 135–145)

## 2012-10-24 MED ORDER — METRONIDAZOLE 500 MG PO TABS: 500.0000 mg | ORAL_TABLET | Freq: Three times a day (TID) | ORAL | Status: DC

## 2012-10-24 MED ORDER — CIPROFLOXACIN HCL 250 MG PO TABS
250.0000 mg | ORAL_TABLET | Freq: Two times a day (BID) | ORAL | Status: DC
  Administered 2012-10-24: 250 mg via ORAL
  Filled 2012-10-24: qty 1

## 2012-10-24 MED ORDER — METRONIDAZOLE 500 MG PO TABS
500.0000 mg | ORAL_TABLET | Freq: Three times a day (TID) | ORAL | Status: DC
  Administered 2012-10-24: 500 mg via ORAL
  Filled 2012-10-24: qty 1

## 2012-10-24 MED ORDER — VANCOMYCIN HCL 10 G IV SOLR: 1750.0000 mg | Freq: Two times a day (BID) | INTRAVENOUS | Status: DC

## 2012-10-24 MED ORDER — CIPROFLOXACIN HCL 250 MG PO TABS: 250.0000 mg | ORAL_TABLET | Freq: Two times a day (BID) | ORAL | Status: DC

## 2012-10-24 NOTE — Progress Notes (Signed)
Discharge instructions and prescriptions given, verbalized understanding. 

## 2012-10-24 NOTE — Clinical Social Work Note (Signed)
CSW referred today due to new HIV diagnosis. Pt d/c prior to CSW assessment. Per notes, pt was given instructions for follow up with ID.   Derenda Fennel, [place] [phone]

## 2012-10-24 NOTE — Discharge Summary (Signed)
Physician Discharge Summary  Phillip Morales [id number]:[id number] DOB: Jul 03, 1985 DOA: 10/22/2012  PCP: No PCP Per Patient  Admit date: 10/22/2012 Discharge date: 10/24/2012  Time spent: 45 minutes  Recommendations for Outpatient Follow-up:  1. Follow up with Dr. Lovell Sheehan with general surgery 10/31/12 2. Infectious disease clinic will contact pt for follow up appointment  Discharge Diagnoses:  Active Problems:   Gluteal abscess   Anemia   Leukocytosis, unspecified   Hyponatremia   Human immunodeficiency virus (HIV) disease   Discharge Condition: stable  Diet recommendation: regular   Filed Weights   10/22/12 1352 10/22/12 1722  Weight: 173 lb (78.472 kg) 178 lb (80.74 kg)    History of present illness:  Phillip Morales is a 27 y.o. male who presented to the emergency room on 10/22/12 having already been seen in the ER couple of days prior with  right buttock swelling and pain. He was diagnosed with an abscess and was prescribed antibiotics. Unfortunately, he did not take this course of antibiotics and he presented again with worsening swelling. He was evaluated by the ER physician who performed an incision and drainage under sedation. He was admitted for intravenous antibiotics and further care.      Hospital Course:  1. R gluteal abscess, status post incision and drainage with pinrose drain. Pt admitted and provided with vancomycin and zosyn for 2 days. Evaluated by general surgery who opined pt should be discharged on cipro and flagyl for 10 days. Pinrose drain removed on day of discharge per surgery.  Pt remained afebrile and non-toxic appearing during hospitalization.  White count 11.2 on admission and within normal limits at discharge. HIV antibody reactive. ID will contact for follow up 2. Anemia, normocytic, unclear etiology. Anemia panel yields iron 11 and reticulocytes 3.41. Probably related to HIV. No s/sx of bleeding. Recommend FOBT as OP.   3. Leukocytosis: related to #1. Resolved.  Continue antibiotics as above 4. Hyponatremia: mild. related to decreased po intake. Trending up. Pt taking po's well. Recommend follow up bmet in 1 week.  5. HIV: Hepatitis panel, HIV-1 RNA, T-helper, HLA B 5701 pending per ID. ID clinic will contact pt for follow up. Social work consult requested.    Procedures: I&D right gluteal abscess 10/22/12 Consultations:  General surger  Id  Discharge Exam: Filed Vitals:   10/24/12 0615  BP: 148/75  Pulse: 67  Temp: 97.9 F (36.6 C)  Resp: 20    General: well nourished NAD Cardiovascular: RRR No MGR No LE edema Respiratory: normal effort BS clear bilaterally. No wheeze no rhonchi Skin: right gluteal wound clean and dry. No erythema or induration. Moderate amount drainage    Discharge Instructions  Discharge Orders   Future Orders Complete By Expires     Diet - low sodium heart healthy  As directed     Discharge instructions  As directed     Comments:      Take medication as prescribed.  Infectious Disease clinic will contact you for follow up.    Increase activity slowly  As directed         Medication List    STOP taking these medications       clindamycin 300 MG capsule  Commonly known as:  CLEOCIN      TAKE these medications       ciprofloxacin 250 MG tablet  Commonly known as:  CIPRO  Take 1 tablet (250 mg total) by mouth 2 (two) times daily.     ibuprofen 200  MG tablet  Commonly known as:  ADVIL,MOTRIN  Take 600 mg by mouth every 6 (six) hours as needed for pain.     metroNIDAZOLE 500 MG tablet  Commonly known as:  FLAGYL  Take 1 tablet (500 mg total) by mouth every 8 (eight) hours.     oxyCODONE-acetaminophen 5-325 MG per tablet  Commonly known as:  PERCOCET  Take 2 tablets by mouth every 4 (four) hours as needed for pain.       Allergies  Allergen Reactions  . Peanut-Containing Drug Products Nausea And Vomiting       Follow-up Information   Follow up with Dalia Heading, MD. Schedule an  appointment as soon as possible for a visit on 10/31/2012.   Contact information:   [address] [place] [address] [phone]       Follow up with Acey Lav, MD. (Infectious disease clinic will contact you for appointment)    Contact information:   [address] [place] [address] [phone]        The results of significant diagnostics from this hospitalization (including imaging, microbiology, ancillary and laboratory) are listed below for reference.    Significant Diagnostic Studies: No results found.  Microbiology: No results found for this or any previous visit (from the past 240 hour(s)).   Labs: Basic Metabolic Panel:  Recent Labs Lab 10/22/12 1518 10/23/12 0535 10/24/12 0650  NA 130* 132* 133*  K 3.4* 3.1* 3.7  CL 95* 98 98  CO2 26 28 32  GLUCOSE 108* 96 105*  BUN 9 6 4*  CREATININE 0.92 0.89 0.89  CALCIUM 8.9 8.4 9.0   Liver Function Tests:  Recent Labs Lab 10/23/12 0535  AST 16  ALT 9  ALKPHOS 92  BILITOT 0.4  PROT 9.6*  ALBUMIN 2.4*   No results found for this basename: LIPASE, AMYLASE,  in the last 168 hours No results found for this basename: AMMONIA,  in the last 168 hours CBC:  Recent Labs Lab 10/22/12 1518 10/23/12 0535  WBC 11.2* 10.0  NEUTROABS 9.1*  --   HGB 9.8* 9.0*  HCT 27.6* 25.6*  MCV 80.0 80.5  PLT 216 205   Cardiac Enzymes: No results found for this basename: CKTOTAL, CKMB, CKMBINDEX, TROPONINI,  in the last 168 hours BNP: BNP (last 3 results) No results found for this basename: PROBNP,  in the last 8760 hours CBG: No results found for this basename: GLUCAP,  in the last 168 hours     Signed:  Gwenyth Bender  [facility] 10/24/2012, 11:35 AM Attending: Patient seen and examined. Per surgery, he is medically stable for discharge. He has been diagnosed with HIV and patient has been informed of the diagnosis and arrangements will be made for followup  in the HIV clinic in [place]. He will followup with Dr. Lovell Sheehan, surgery regarding his postoperative care from his gluteal abscess.

## 2012-10-24 NOTE — Progress Notes (Signed)
  Subjective: Still has some gluteal pain.  Objective: Vital signs in last 24 hours: Temp:  [97.9 F (36.6 C)-98.5 F (36.9 C)] 97.9 F (36.6 C) (08/12 0615) Pulse Rate:  [67-84] 67 (08/12 0615) Resp:  [20] 20 (08/12 0615) BP: (148-156)/(75-104) 148/75 mmHg (08/12 0615) SpO2:  [100 %] 100 % (08/12 0615) Last BM Date: 10/23/12  Intake/Output from previous day: 08/11 0701 - 08/12 0700 In: 1650 [P.O.:1200; IV Piggyback:450] Out: 3025 [Urine:3025] Intake/Output this shift:    General appearance: alert, cooperative and no distress Skin: Still with purulent drainage from right gluteal abscess. Penrose drain removed.  Lab Results:   Recent Labs  10/22/12 1518 10/23/12 0535  WBC 11.2* 10.0  HGB 9.8* 9.0*  HCT 27.6* 25.6*  PLT 216 205   BMET  Recent Labs  10/23/12 0535 10/24/12 0650  NA 132* 133*  K 3.1* 3.7  CL 98 98  CO2 28 32  GLUCOSE 96 105*  BUN 6 4*  CREATININE 0.89 0.89  CALCIUM 8.4 9.0   PT/INR No results found for this basename: LABPROT, INR,  in the last 72 hours  Studies/Results: No results found.  Anti-infectives: Anti-infectives   Start     Dose/Rate Route Frequency Ordered Stop   10/23/12 0000  vancomycin (VANCOCIN) IVPB 1000 mg/200 mL premix     1,000 mg 200 mL/hr over 60 Minutes Intravenous Every 8 hours 10/22/12 1758     10/22/12 2000  piperacillin-tazobactam (ZOSYN) IVPB 3.375 g     3.375 g 12.5 mL/hr over 240 Minutes Intravenous Every 8 hours 10/22/12 1758     10/22/12 1515  cefTRIAXone (ROCEPHIN) 1 g in dextrose 5 % 50 mL IVPB     1 g 100 mL/hr over 30 Minutes Intravenous  Once 10/22/12 1514 10/22/12 1602   10/22/12 1515  vancomycin (VANCOCIN) IVPB 1000 mg/200 mL premix     1,000 mg 200 mL/hr over 60 Minutes Intravenous  Once 10/22/12 1514 10/22/12 1732      Assessment/Plan: Impression: Resolving right gluteal abscess. Would place on ciprofloxacin and Flagyl for 10 days. He should keep the wound clean and dry daily. He was told  that we'll continue draining. Will see him as an outpatient.  LOS: 2 days    Phillip Morales A 10/24/2012

## 2012-10-24 NOTE — Progress Notes (Signed)
Out in stable condition via w/c with staff.

## 2012-10-24 NOTE — Progress Notes (Signed)
ANTIBIOTIC CONSULT NOTE   Pharmacy Consult for Vancomycin & Zosyn Indication: Gluteal abscess, possibly MRSA  Allergies  Allergen Reactions  . Peanut-Containing Drug Products Nausea And Vomiting   Patient Measurements: Height: 5\' 7"  (170.2 cm) Weight: 178 lb (80.74 kg) IBW/kg (Calculated) : 66.1  Vital Signs: Temp: 97.9 F (36.6 C) (08/12 0615) Temp src: Oral (08/12 0615) BP: 148/75 mmHg (08/12 0615) Pulse Rate: 67 (08/12 0615) Intake/Output from previous day: 08/11 0701 - 08/12 0700 In: 1650 [P.O.:1200; IV Piggyback:450] Out: 3025 [Urine:3025] Intake/Output from this shift:    Labs:  Recent Labs  10/22/12 1518 10/23/12 0535 10/24/12 0650  WBC 11.2* 10.0  --   HGB 9.8* 9.0*  --   PLT 216 205  --   CREATININE 0.92 0.89 0.89   Estimated Creatinine Clearance: 126.8 ml/min (by C-G formula based on Cr of 0.89).  Recent Labs  10/24/12 0656  VANCOTROUGH 13.4    Microbiology: No results found for this or any previous visit (from the past 720 hour(s)).  Medical History: Past Medical History  Diagnosis Date  . Asthma    Medications:  Scheduled:  . heparin  5,000 Units Subcutaneous Q8H  . piperacillin-tazobactam (ZOSYN)  IV  3.375 g Intravenous Q8H  . vancomycin  1,000 mg Intravenous Q8H   Assessment: Phillip Morales with gluteal abscess.  Pt has good renal fxn.  Estimated Creatinine Clearance: 126.8 ml/min (by C-G formula based on Cr of 0.89).  Vancomycin:  8/10 >> Zosyn:  8/10 >>  Goal of Therapy:  Vancomycin trough level 15-20 mcg/ml  Plan:  Change Vancomycin to 1750mg  IV q12hrs Zosyn 3.375 GM IV every 8 hours, infused over 4 hours Vancomycin trough at least weekly Monitor renal function twice weekly Labs per protocol  Valrie Hart A 10/24/2012,10:24 AM

## 2012-10-24 NOTE — Care Management Note (Signed)
    Page 1 of 1   10/24/2012     1:44:18 PM   CARE MANAGEMENT NOTE 10/24/2012  Patient:  Phillip Morales, Phillip Morales   Account Number:  [id number]  Date Initiated:  10/24/2012  Documentation initiated by:  Rosemary Holms  Subjective/Objective Assessment:   Pt admitted from home where he lives with Morales friend. Will be DC'd today with f/u instructions for care. [facility] RN to assist with wound care.     Action/Plan:   Anticipated DC Date:  10/24/2012   Anticipated DC Plan:  HOME W HOME HEALTH SERVICES      DC Planning Services  CM consult      Choice offered to / List presented to:             Status of service:  Completed, signed off Medicare Important Message given?   (If response is "NO", the following Medicare IM given date fields will be blank) Date Medicare IM given:   Date Additional Medicare IM given:    Discharge Disposition:  HOME W HOME HEALTH SERVICES  Per UR Regulation:    If discussed at Long Length of Stay Meetings, dates discussed:    Comments:  10/24/12 Rosemary Holms RN BSN CM

## 2012-10-25 ENCOUNTER — Telehealth: Payer: Self-pay | Admitting: *Deleted

## 2012-10-25 LAB — T-HELPER CELLS (CD4) COUNT (NOT AT ARMC): CD4 % Helper T Cell: 12 % — ABNORMAL LOW (ref 33–55)

## 2012-10-25 LAB — HEPATITIS PANEL, ACUTE
HCV Ab: NEGATIVE
Hepatitis B Surface Ag: NEGATIVE

## 2012-10-25 NOTE — Telephone Encounter (Signed)
Patient called advised he was recently in the hospital and was told we would contact him. He has a new number and wanted me to add it to the chart. Advised him will add it to the chart and remove his fathers number as he does not want [place] to call him. [phone]

## 2012-10-27 LAB — HLA B*5701: HLA B 5701: NEGATIVE

## 2012-11-01 ENCOUNTER — Ambulatory Visit: Payer: Self-pay | Admitting: Infectious Disease

## 2012-11-01 ENCOUNTER — Ambulatory Visit: Payer: Self-pay

## 2013-01-31 ENCOUNTER — Telehealth: Payer: Self-pay

## 2013-01-31 NOTE — Telephone Encounter (Signed)
Patient's phone number gives constant busy signal.    Message left on [facility]'s Laurette Schimke voice mail with appointment information.   Laurell Josephs, RN

## 2013-02-13 ENCOUNTER — Ambulatory Visit (INDEPENDENT_AMBULATORY_CARE_PROVIDER_SITE_OTHER): Payer: Self-pay

## 2013-02-13 DIAGNOSIS — B20 Human immunodeficiency virus [HIV] disease: Secondary | ICD-10-CM

## 2013-02-13 DIAGNOSIS — Z8614 Personal history of Methicillin resistant Staphylococcus aureus infection: Secondary | ICD-10-CM

## 2013-02-13 DIAGNOSIS — Z79899 Other long term (current) drug therapy: Secondary | ICD-10-CM

## 2013-02-13 DIAGNOSIS — Z23 Encounter for immunization: Secondary | ICD-10-CM

## 2013-02-13 DIAGNOSIS — I10 Essential (primary) hypertension: Secondary | ICD-10-CM

## 2013-02-13 DIAGNOSIS — Z113 Encounter for screening for infections with a predominantly sexual mode of transmission: Secondary | ICD-10-CM

## 2013-02-13 DIAGNOSIS — Z8619 Personal history of other infectious and parasitic diseases: Secondary | ICD-10-CM

## 2013-02-13 LAB — URINALYSIS
Glucose, UA: NEGATIVE mg/dL
Leukocytes, UA: NEGATIVE
Specific Gravity, Urine: 1.017 (ref 1.005–1.030)
pH: 6 (ref 5.0–8.0)

## 2013-02-14 LAB — HEPATITIS B SURFACE ANTIBODY,QUALITATIVE: Hep B S Ab: POSITIVE — AB

## 2013-02-14 LAB — CBC WITH DIFFERENTIAL/PLATELET
Eosinophils Absolute: 0.1 10*3/uL (ref 0.0–0.7)
Eosinophils Relative: 3 % (ref 0–5)
HCT: 36.5 % — ABNORMAL LOW (ref 39.0–52.0)
Hemoglobin: 12.4 g/dL — ABNORMAL LOW (ref 13.0–17.0)
Lymphs Abs: 1.2 10*3/uL (ref 0.7–4.0)
MCH: 30 pg (ref 26.0–34.0)
MCV: 88.2 fL (ref 78.0–100.0)
Monocytes Relative: 11 % (ref 3–12)
RBC: 4.14 MIL/uL — ABNORMAL LOW (ref 4.22–5.81)

## 2013-02-14 LAB — COMPLETE METABOLIC PANEL WITH GFR
CO2: 27 mEq/L (ref 19–32)
Calcium: 9.5 mg/dL (ref 8.4–10.5)
Creat: 1 mg/dL (ref 0.50–1.35)
GFR, Est African American: >89 mL/min
GFR, Est Non African American: >89 mL/min
Glucose, Bld: 93 mg/dL (ref 70–99)
Total Bilirubin: 0.3 mg/dL (ref 0.3–1.2)
Total Protein: 8.8 g/dL — ABNORMAL HIGH (ref 6.0–8.3)

## 2013-02-14 LAB — LIPID PANEL
HDL: 40 mg/dL (ref >39)
Total CHOL/HDL Ratio: 3.8 Ratio
Triglycerides: 130 mg/dL (ref <150)

## 2013-02-14 LAB — HEPATITIS B SURFACE ANTIGEN: Hepatitis B Surface Ag: NEGATIVE

## 2013-02-14 LAB — T.PALLIDUM AB, TOTAL: T pallidum Antibodies (TP-PA): >8 S/CO — ABNORMAL HIGH (ref <0.90)

## 2013-02-14 LAB — T-HELPER CELL (CD4) - (RCID CLINIC ONLY): CD4 % Helper T Cell: 23 % — ABNORMAL LOW (ref 33–55)

## 2013-02-14 LAB — RPR TITER: RPR Titer: 1:4 {titer}

## 2013-02-15 LAB — HIV-1 RNA ULTRAQUANT REFLEX TO GENTYP+: HIV 1 RNA Quant: 193 copies/mL — ABNORMAL HIGH (ref <20)

## 2013-02-16 DIAGNOSIS — Z8614 Personal history of Methicillin resistant Staphylococcus aureus infection: Secondary | ICD-10-CM | POA: Insufficient documentation

## 2013-02-16 DIAGNOSIS — I10 Essential (primary) hypertension: Secondary | ICD-10-CM | POA: Insufficient documentation

## 2013-02-16 DIAGNOSIS — Z8619 Personal history of other infectious and parasitic diseases: Secondary | ICD-10-CM | POA: Insufficient documentation

## 2013-02-16 NOTE — Progress Notes (Signed)
Patient tested positive for HIV in August 2014 and was incarcerated shortly afterwards. Started Truvada, Norvir and Prezista in September 2014. He was discharged November, 2014.  Positive RPR 11-08-12 while incarcerated and treated with Bicillin X 3 .  Records received. Vaccines updated.   Laurell Josephs, RN

## 2013-02-26 ENCOUNTER — Encounter (HOSPITAL_COMMUNITY): Payer: Self-pay | Admitting: Emergency Medicine

## 2013-02-26 ENCOUNTER — Emergency Department (HOSPITAL_COMMUNITY)
Admission: EM | Admit: 2013-02-26 | Discharge: 2013-02-26 | Disposition: A | Discharge status: Home / Self Care | Payer: Self-pay | Attending: Emergency Medicine | Admitting: Emergency Medicine

## 2013-02-26 DIAGNOSIS — S58119A Complete traumatic amputation at level between elbow and wrist, unspecified arm, initial encounter: Secondary | ICD-10-CM | POA: Insufficient documentation

## 2013-02-26 DIAGNOSIS — Z792 Long term (current) use of antibiotics: Secondary | ICD-10-CM | POA: Insufficient documentation

## 2013-02-26 DIAGNOSIS — Z21 Asymptomatic human immunodeficiency virus [HIV] infection status: Secondary | ICD-10-CM | POA: Insufficient documentation

## 2013-02-26 DIAGNOSIS — J45909 Unspecified asthma, uncomplicated: Secondary | ICD-10-CM | POA: Insufficient documentation

## 2013-02-26 DIAGNOSIS — I10 Essential (primary) hypertension: Secondary | ICD-10-CM | POA: Insufficient documentation

## 2013-02-26 DIAGNOSIS — Y9341 Activity, dancing: Secondary | ICD-10-CM | POA: Insufficient documentation

## 2013-02-26 DIAGNOSIS — Y990 Civilian activity done for income or pay: Secondary | ICD-10-CM | POA: Insufficient documentation

## 2013-02-26 DIAGNOSIS — F17200 Nicotine dependence, unspecified, uncomplicated: Secondary | ICD-10-CM | POA: Insufficient documentation

## 2013-02-26 DIAGNOSIS — L0231 Cutaneous abscess of buttock: Secondary | ICD-10-CM | POA: Insufficient documentation

## 2013-02-26 DIAGNOSIS — Y9289 Other specified places as the place of occurrence of the external cause: Secondary | ICD-10-CM | POA: Insufficient documentation

## 2013-02-26 DIAGNOSIS — Z79899 Other long term (current) drug therapy: Secondary | ICD-10-CM | POA: Insufficient documentation

## 2013-02-26 DIAGNOSIS — R296 Repeated falls: Secondary | ICD-10-CM | POA: Insufficient documentation

## 2013-02-26 HISTORY — DX: Asymptomatic human immunodeficiency virus (hiv) infection status: Z21

## 2013-02-26 HISTORY — DX: Essential (primary) hypertension: I10

## 2013-02-26 HISTORY — DX: Human immunodeficiency virus (HIV) disease: B20

## 2013-02-26 LAB — CBC WITH DIFFERENTIAL/PLATELET
Basophils Absolute: 0 10*3/uL (ref 0.0–0.1)
Basophils Relative: 0 % (ref 0–1)
Eosinophils Absolute: 0 10*3/uL (ref 0.0–0.7)
Eosinophils Relative: 0 % (ref 0–5)
HCT: 33 % — ABNORMAL LOW (ref 39.0–52.0)
Hemoglobin: 11.7 g/dL — ABNORMAL LOW (ref 13.0–17.0)
MCH: 30.5 pg (ref 26.0–34.0)
MCHC: 35.5 g/dL (ref 30.0–36.0)
MCV: 85.9 fL (ref 78.0–100.0)
Monocytes Absolute: 0.7 10*3/uL (ref 0.1–1.0)
Monocytes Relative: 8 % (ref 3–12)
RDW: 13.8 % (ref 11.5–15.5)

## 2013-02-26 LAB — BASIC METABOLIC PANEL
BUN: 7 mg/dL (ref 6–23)
Calcium: 9.5 mg/dL (ref 8.4–10.5)
Creatinine, Ser: 0.99 mg/dL (ref 0.50–1.35)
GFR calc Af Amer: >90 mL/min (ref >90)
GFR calc non Af Amer: >90 mL/min (ref >90)
Potassium: 3.7 mEq/L (ref 3.5–5.1)

## 2013-02-26 MED ORDER — OXYCODONE-ACETAMINOPHEN 5-325 MG PO TABS: 1.0000 | ORAL_TABLET | ORAL | Status: DC | PRN

## 2013-02-26 MED ORDER — METRONIDAZOLE 500 MG PO TABS: 500.0000 mg | ORAL_TABLET | Freq: Two times a day (BID) | ORAL | Status: DC

## 2013-02-26 MED ORDER — CIPROFLOXACIN HCL 500 MG PO TABS: 500.0000 mg | ORAL_TABLET | Freq: Two times a day (BID) | ORAL | Status: DC

## 2013-02-26 MED ORDER — OXYCODONE-ACETAMINOPHEN 5-325 MG PO TABS
2.0000 | ORAL_TABLET | Freq: Once | ORAL | Status: AC
  Administered 2013-02-26: 2 via ORAL
  Filled 2013-02-26: qty 2

## 2013-02-26 NOTE — ED Notes (Signed)
At bedside to dress abscess. Pt site draining large amounts of dark pink drainage, unable to clean site enough for an effective dressing, PA aware.

## 2013-02-26 NOTE — ED Notes (Signed)
Pt alert & oriented x4, stable gait. Patient given discharge instructions, paperwork & prescription(s). Patient  instructed to stop at the registration desk to finish any additional paperwork. Patient verbalized understanding. Pt left department w/ no further questions. 

## 2013-02-26 NOTE — ED Notes (Signed)
Pt co reoccurring abscess to lt interior lower and mid buttock. The pt states that he is HIV positive and has these abscesses often, pt has been on chronic antibiotics for same, since his last antibiotic the abscess has gotten more painful and swollen, pt states he can hardly make a move without feeling pain. Pt also states that the last time he had to be admitted and put under anesthesia for I&D, and receive IV antibiotics.

## 2013-02-26 NOTE — ED Notes (Signed)
At bedside with PA to chaperone examination. Upon examination, abscess erupted and drained serosanguinous fluid, then mostly bloody discharge, PA placed 4x4's with pressure while she consults with EDP.

## 2013-02-26 NOTE — ED Notes (Signed)
PA able to express the abscess until it stopped draining. Dressing applied.

## 2013-02-26 NOTE — ED Provider Notes (Signed)
CSN: [id number]     Arrival date & time 02/26/13  0708 History   First MD Initiated Contact with Patient 02/26/13 [phone]     Chief Complaint  Patient presents with  . Abscess   (Consider location/radiation/quality/duration/timing/severity/associated sxs/prior Treatment) HPI Comments: Phillip Morales is a 27 y.o. Male presenting with an abscess on his left buttock which has been intermittent for the past several months.  His past medical history is significant for HIV with a decreased CD4 count.  He reports recently being released from prison during which time he had an abscess of his right buttock which was lanced and he just completed a four-week course of doxycycline on December 3 for this other infection.  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  He denies documented fevers but has felt feverish, denies nausea, vomiting, weakness, headache or chills.  He is scheduled to establish care with his HIV doctor tomorrow in [place].  He has found no alleviators for his current pain, has used ibuprofen without relief.    The history is provided by the patient.    Past Medical History  Diagnosis Date  . Asthma   . HIV (human immunodeficiency virus infection)   . Hypertension    History reviewed. No pertinent past surgical history. No family history on file. History  Substance Use Topics  . Smoking status: Current Some Day Smoker -- 0.25 packs/day    Types: Cigarettes  . Smokeless tobacco: Not on file  . Alcohol Use: No    Review of Systems  Constitutional: Negative for fever and chills.  HENT: Negative for congestion and sore throat.   Eyes: Negative.   Respiratory: Negative for chest tightness and shortness of breath.   Cardiovascular: Negative for chest pain.  Gastrointestinal: Negative for nausea and abdominal pain.   Genitourinary: Negative.   Musculoskeletal: Negative for arthralgias, joint swelling and neck pain.  Skin: Positive for color change and wound. Negative for rash.  Neurological: Negative for dizziness, weakness, light-headedness, numbness and headaches.  Psychiatric/Behavioral: Negative.     Allergies  Review of patient's allergies indicates no known allergies.  Home Medications   Current Outpatient Rx  Name  Route  Sig  Dispense  Refill  . dapsone 100 MG tablet   Oral   Take 100 mg by mouth daily.         . Darunavir Ethanolate (PREZISTA) 800 MG tablet   Oral   Take 800 mg by mouth.         Marland Kitchen emtricitabine-tenofovir (TRUVADA) 200-300 MG per tablet   Oral   Take 1 tablet by mouth daily.         . ferrous sulfate 325 (65 FE) MG tablet   Oral   Take 325 mg by mouth daily with breakfast.         . ibuprofen (ADVIL,MOTRIN) 200 MG tablet   Oral   Take 600 mg by mouth every 6 (six) hours as needed for pain.         . ritonavir (NORVIR) 100 MG capsule   Oral   Take by mouth daily with breakfast.         . ciprofloxacin (CIPRO) 500 MG tablet   Oral   Take 1 tablet (500 mg total) by mouth 2 (two) times daily.   20 tablet  0   . metroNIDAZOLE (FLAGYL) 500 MG tablet   Oral   Take 1 tablet (500 mg total) by mouth 2 (two) times daily.   14 tablet   0   . oxyCODONE-acetaminophen (PERCOCET/ROXICET) 5-325 MG per tablet   Oral   Take 1 tablet by mouth every 4 (four) hours as needed for severe pain.   20 tablet   0    BP 137/68  Pulse 84  Temp(Src) 98.4 F (36.9 C) (Oral)  Resp 16  Ht 5\' 7"  (1.702 m)  Wt 180 lb (81.647 kg)  BMI 28.19 kg/m2  SpO2 100% Physical Exam  Nursing note and vitals reviewed. Constitutional: He is oriented to person, place, and time. He appears well-developed and well-nourished.  HENT:  Head: Normocephalic and atraumatic.  Eyes: Conjunctivae are normal.  Neck: Normal range of motion.  Cardiovascular: Normal rate, regular  rhythm, normal heart sounds and intact distal pulses.   Pulmonary/Chest: Effort normal and breath sounds normal. He has no wheezes.  Abdominal: Soft. Bowel sounds are normal. There is no tenderness.  Musculoskeletal: Normal range of motion.  Neurological: He is alert and oriented to person, place, and time. No sensory deficit.  Skin: Skin is warm and dry. Laceration noted.  Approximate baseball size tender induration left buttock with pointing just exterior to the anus, no drainage.  Digital rectal exam negative for internal fluctuance or increased pain.  During this procedure the abscess started to drain bloody purulence  externally. copious purulent drainage obtained with gentle pressure.    Psychiatric: He has a normal mood and affect.    ED Course  Procedures (including critical care time) Labs Review Labs Reviewed  CBC WITH DIFFERENTIAL - Abnormal; Notable for the following:    RBC 3.84 (*)    Hemoglobin 11.7 (*)    HCT 33.0 (*)    Neutrophils Relative % 81 (*)    Lymphocytes Relative 11 (*)    All other components within normal limits  BASIC METABOLIC PANEL - Abnormal; Notable for the following:    Sodium 134 (*)    Glucose, Bld 109 (*)    All other components within normal limits   Imaging Review No results found.  EKG Interpretation   None       MDM   1. Abscess of buttock, left    Labs reviewed prior to discharge home.  Patient was seen by Dr. Adriana Simas who suggested surgical consult.  Spoke with Dr. Lovell Sheehan who recommended outpatient treatment since this area is now draining.  Recommended Cipro and Flagyl, warm soaks and will followup with him in his office in several days.  Dressing was applied to the abscess site.  Discussed copious sitz baths to facilitate further draining.  Also advised patient to advise his HIV doctor of this condition when he sees him in the morning.    Burgess Amor, PA-C 02/26/13 [phone]

## 2013-02-26 NOTE — ED Notes (Signed)
Patient wanted some water. PA stated for him to wait until consult with the surgeon. Patient made aware.

## 2013-02-26 NOTE — ED Notes (Signed)
Abscess to left buttock x 2 months.  Taking abx with no relief.

## 2013-02-26 NOTE — ED Provider Notes (Signed)
Medical screening examination/treatment/procedure(s) were conducted as a shared visit with non-physician practitioner(s) and myself.  I personally evaluated the patient during the encounter.  EKG Interpretation   None      Left buttock abscess is now draining. Discussed with Dr. Franky Macho.   Rx Flagyl and Cipro. Follow up with general surgery  Donnetta Hutching, MD 02/26/13 1537

## 2013-02-27 ENCOUNTER — Ambulatory Visit (INDEPENDENT_AMBULATORY_CARE_PROVIDER_SITE_OTHER): Payer: Self-pay | Admitting: Internal Medicine

## 2013-02-27 ENCOUNTER — Encounter: Payer: Self-pay | Admitting: Internal Medicine

## 2013-02-27 VITALS — BP 142/78 | HR 76 | Temp 98.2°F | Ht 67.0 in | Wt 191.0 lb

## 2013-02-27 DIAGNOSIS — B20 Human immunodeficiency virus [HIV] disease: Secondary | ICD-10-CM

## 2013-02-27 DIAGNOSIS — Z23 Encounter for immunization: Secondary | ICD-10-CM

## 2013-02-27 DIAGNOSIS — I10 Essential (primary) hypertension: Secondary | ICD-10-CM

## 2013-02-27 DIAGNOSIS — L0231 Cutaneous abscess of buttock: Secondary | ICD-10-CM

## 2013-02-27 DIAGNOSIS — D649 Anemia, unspecified: Secondary | ICD-10-CM

## 2013-02-27 MED ORDER — RITONAVIR 100 MG PO TABS: 100.0000 mg | ORAL_TABLET | Freq: Every day | ORAL | Status: DC

## 2013-02-27 MED ORDER — DARUNAVIR ETHANOLATE 800 MG PO TABS: 800.0000 mg | ORAL_TABLET | Freq: Every day | ORAL | Status: DC

## 2013-02-27 MED ORDER — AMLODIPINE BESYLATE 5 MG PO TABS: 5.0000 mg | ORAL_TABLET | Freq: Every day | ORAL | Status: DC

## 2013-02-27 MED ORDER — EMTRICITABINE-TENOFOVIR DF 200-300 MG PO TABS: 1.0000 | ORAL_TABLET | Freq: Every day | ORAL | Status: DC

## 2013-02-27 MED ORDER — FERROUS SULFATE 325 (65 FE) MG PO TABS: 325.0000 mg | ORAL_TABLET | Freq: Every day | ORAL | Status: DC

## 2013-02-27 NOTE — Assessment & Plan Note (Signed)
He is going to continue with his antibiotics for now

## 2013-02-27 NOTE — Addendum Note (Signed)
Addended by: Andree Coss on: 02/27/2013 10:33 AM   Modules accepted: Orders

## 2013-02-27 NOTE — Assessment & Plan Note (Signed)
I will have him continue his current regimen and in the near future we'll consider changing to a one pill a day regimen such as Stribild. His recent CD4 is over 300 so will defer any prophylactic medicine at this time

## 2013-02-27 NOTE — Progress Notes (Signed)
   Subjective:    Patient ID: Phillip Morales, male    DOB: 1985/03/25, 27 y.o.   MRN: [id number]  HPI Here to establish care as a new patient. He was diagnosed with HIV in August of 2014 and subsequently went to jail. He since has been released from jail. He was started there on Prezista, Norvir and Truvada and also with a CD4 count of 133 was started on dapsone prophylaxis.  He had good tolerance and compliance with the medication. He is now starting his paperwork for the drug assistance program. He lives in [place]. He has no complaints. He was treated for syphilis with 3 Bicillin injections when he was in jail. He also has previously been on blood pressure medicine. He has had no problems with diarrhea, weight loss, rash.  No headaches or vision changes. He has been seen in the emergency room 2-3 boil on his buttock. He is currently getting antibiotics for that. No history of gonorrhea or chlamydia. He does have a history of anemia and labs in jail did show a normocytic anemia but he was started on iron replacement.   Review of Systems  Constitutional: Negative for fever and fatigue.  HENT: Negative for trouble swallowing.   Eyes: Negative for visual disturbance.  Respiratory: Negative for shortness of breath.   Gastrointestinal: Negative for nausea and diarrhea.  Endocrine: Negative for polyuria.  Genitourinary: Negative for discharge and genital sores.  Musculoskeletal: Negative for myalgias.  Skin: Negative for rash.  Neurological: Negative for dizziness, light-headedness and headaches.  Psychiatric/Behavioral: Negative for suicidal ideas and dysphoric mood.       Objective:   Physical Exam  Constitutional: He is oriented to person, place, and time. He appears well-developed and well-nourished. No distress.  HENT:  Mouth/Throat: No oropharyngeal exudate.  Eyes: Right eye exhibits no discharge. Left eye exhibits no discharge. No scleral icterus.  Cardiovascular: Normal rate, regular  rhythm and normal heart sounds.   No murmur heard. Pulmonary/Chest: Effort normal and breath sounds normal. No respiratory distress. He has no wheezes.  Lymphadenopathy:    He has no cervical adenopathy.  Neurological: He is alert and oriented to person, place, and time.  Skin: Skin is warm and dry. No rash noted.  Psychiatric: He has a normal mood and affect. His behavior is normal.          Assessment & Plan:

## 2013-02-27 NOTE — Assessment & Plan Note (Signed)
Restart him on Norvasc once he has drug coverage

## 2013-02-27 NOTE — Assessment & Plan Note (Signed)
I will continue his iron for now though I did not appreciate significant iron deficiency on his labs in jail. He has though had an increase in his hemoglobin. He likely will only need this a short time. His anemia is likely related to his HIV more than iron deficiency.

## 2013-02-28 ENCOUNTER — Other Ambulatory Visit: Payer: Self-pay | Admitting: *Deleted

## 2013-02-28 DIAGNOSIS — B20 Human immunodeficiency virus [HIV] disease: Secondary | ICD-10-CM

## 2013-02-28 MED ORDER — EMTRICITABINE-TENOFOVIR DF 200-300 MG PO TABS: 1.0000 | ORAL_TABLET | Freq: Every day | ORAL | Status: DC

## 2013-02-28 MED ORDER — DARUNAVIR ETHANOLATE 800 MG PO TABS: 800.0000 mg | ORAL_TABLET | Freq: Every day | ORAL | Status: DC

## 2013-02-28 MED ORDER — RITONAVIR 100 MG PO TABS: 100.0000 mg | ORAL_TABLET | Freq: Every day | ORAL | Status: DC

## 2013-02-28 NOTE — Telephone Encounter (Signed)
Needed printed rxes for ADAP application and signature.

## 2013-03-23 ENCOUNTER — Other Ambulatory Visit: Payer: Self-pay | Admitting: *Deleted

## 2013-03-23 DIAGNOSIS — B20 Human immunodeficiency virus [HIV] disease: Secondary | ICD-10-CM

## 2013-03-23 MED ORDER — EMTRICITABINE-TENOFOVIR DF 200-300 MG PO TABS: 1.0000 | ORAL_TABLET | Freq: Every day | ORAL | Status: DC

## 2013-03-23 MED ORDER — DARUNAVIR ETHANOLATE 800 MG PO TABS: 800.0000 mg | ORAL_TABLET | Freq: Every day | ORAL | Status: DC

## 2013-03-23 MED ORDER — RITONAVIR 100 MG PO TABS: 100.0000 mg | ORAL_TABLET | Freq: Every day | ORAL | Status: DC

## 2013-04-02 ENCOUNTER — Encounter (HOSPITAL_COMMUNITY): Payer: Self-pay | Admitting: Emergency Medicine

## 2013-04-02 ENCOUNTER — Emergency Department (HOSPITAL_COMMUNITY)
Admission: EM | Admit: 2013-04-02 | Discharge: 2013-04-02 | Disposition: A | Discharge status: Home / Self Care | Payer: Self-pay | Attending: Emergency Medicine | Admitting: Emergency Medicine

## 2013-04-02 ENCOUNTER — Emergency Department (HOSPITAL_COMMUNITY): Payer: Self-pay

## 2013-04-02 DIAGNOSIS — Z79899 Other long term (current) drug therapy: Secondary | ICD-10-CM | POA: Insufficient documentation

## 2013-04-02 DIAGNOSIS — B20 Human immunodeficiency virus [HIV] disease: Secondary | ICD-10-CM

## 2013-04-02 DIAGNOSIS — Z21 Asymptomatic human immunodeficiency virus [HIV] infection status: Secondary | ICD-10-CM | POA: Insufficient documentation

## 2013-04-02 DIAGNOSIS — I10 Essential (primary) hypertension: Secondary | ICD-10-CM | POA: Insufficient documentation

## 2013-04-02 DIAGNOSIS — J45909 Unspecified asthma, uncomplicated: Secondary | ICD-10-CM | POA: Insufficient documentation

## 2013-04-02 DIAGNOSIS — R509 Fever, unspecified: Secondary | ICD-10-CM | POA: Insufficient documentation

## 2013-04-02 DIAGNOSIS — J159 Unspecified bacterial pneumonia: Secondary | ICD-10-CM | POA: Insufficient documentation

## 2013-04-02 DIAGNOSIS — J189 Pneumonia, unspecified organism: Secondary | ICD-10-CM

## 2013-04-02 DIAGNOSIS — F17200 Nicotine dependence, unspecified, uncomplicated: Secondary | ICD-10-CM | POA: Insufficient documentation

## 2013-04-02 LAB — CBC
HCT: 34.7 % — ABNORMAL LOW (ref 39.0–52.0)
Hemoglobin: 12.6 g/dL — ABNORMAL LOW (ref 13.0–17.0)
MCH: 30.4 pg (ref 26.0–34.0)
MCHC: 36.3 g/dL — AB (ref 30.0–36.0)
MCV: 83.6 fL (ref 78.0–100.0)
Platelets: 175 10*3/uL (ref 150–400)
RBC: 4.15 MIL/uL — ABNORMAL LOW (ref 4.22–5.81)
RDW: 13.9 % (ref 11.5–15.5)
WBC: 10.3 10*3/uL (ref 4.0–10.5)

## 2013-04-02 LAB — POCT I-STAT TROPONIN I: TROPONIN I, POC: 0.01 ng/mL (ref 0.00–0.08)

## 2013-04-02 LAB — BASIC METABOLIC PANEL
BUN: 10 mg/dL (ref 6–23)
CHLORIDE: 99 meq/L (ref 96–112)
CO2: 24 mEq/L (ref 19–32)
Calcium: 9.5 mg/dL (ref 8.4–10.5)
Creatinine, Ser: 1.01 mg/dL (ref 0.50–1.35)
Glucose, Bld: 100 mg/dL — ABNORMAL HIGH (ref 70–99)
Potassium: 4 mEq/L (ref 3.7–5.3)
Sodium: 136 mEq/L — ABNORMAL LOW (ref 137–147)

## 2013-04-02 MED ORDER — AZITHROMYCIN 250 MG PO TABS: 250.0000 mg | ORAL_TABLET | Freq: Every day | ORAL | Status: DC

## 2013-04-02 MED ORDER — OXYCODONE-ACETAMINOPHEN 5-325 MG PO TABS: 2.0000 | ORAL_TABLET | ORAL | Status: DC | PRN

## 2013-04-02 MED ORDER — AZITHROMYCIN 250 MG PO TABS
500.0000 mg | ORAL_TABLET | Freq: Once | ORAL | Status: AC
  Administered 2013-04-02: 500 mg via ORAL
  Filled 2013-04-02: qty 2

## 2013-04-02 MED ORDER — OXYCODONE-ACETAMINOPHEN 5-325 MG PO TABS
2.0000 | ORAL_TABLET | Freq: Once | ORAL | Status: AC
  Administered 2013-04-02: 2 via ORAL
  Filled 2013-04-02: qty 2

## 2013-04-02 NOTE — ED Provider Notes (Addendum)
CSN: [id number]     Arrival date & time 04/02/13  1352 History   First MD Initiated Contact with Patient 04/02/13 1701     Chief Complaint  Patient presents with  . Chest Pain   (Consider location/radiation/quality/duration/timing/severity/associated sxs/prior Treatment) HPI Comments: Patient is a 28 year old male with history of HIV disease. He presents today with complaints of pain in the left side of his chest. He states he has been coughing for the past couple days however this morning it became much worse. He has been coughing up phlegm. The pain is located to the left lateral rib cage and radiates to his left shoulder. He reports low-grade fevers at home. His last CD4 count was 350 in December.  Patient is a 28 y.o. male presenting with chest pain. The history is provided by the patient.  Chest Pain Pain location:  L chest Pain quality: sharp   Pain radiates to:  Does not radiate Pain radiates to the back: no   Pain severity:  Moderate Onset quality:  Sudden Timing:  Constant Progression:  Worsening Chronicity:  New Context: breathing   Relieved by:  Nothing Worsened by:  Coughing and deep breathing Ineffective treatments:  None tried   Past Medical History  Diagnosis Date  . Asthma   . HIV (human immunodeficiency virus infection)   . Hypertension    History reviewed. No pertinent past surgical history. Family History  Problem Relation Age of Onset  . Kidney disease Neg Hx    History  Substance Use Topics  . Smoking status: Current Some Day Smoker -- 0.25 packs/day    Types: Cigarettes  . Smokeless tobacco: Not on file     Comment: contemplating a quit date  . Alcohol Use: No    Review of Systems  Cardiovascular: Positive for chest pain.  All other systems reviewed and are negative.    Allergies  Review of patient's allergies indicates no known allergies.  Home Medications   Current Outpatient Rx  Name  Route  Sig  Dispense  Refill  . amLODipine  (NORVASC) 5 MG tablet   Oral   Take 1 tablet (5 mg total) by mouth daily.   30 tablet   11     Once adap approved   . Darunavir Ethanolate (PREZISTA) 800 MG tablet   Oral   Take 1 tablet (800 mg total) by mouth daily.   30 tablet   5     ADAP Case #[id number] Auth #[id number]   . emtricitabine-tenofovir (TRUVADA) 200-300 MG per tablet   Oral   Take 1 tablet by mouth daily.   30 tablet   5   . ritonavir (NORVIR) 100 MG TABS tablet   Oral   Take 1 tablet (100 mg total) by mouth daily.   30 tablet   5    BP 152/85  Pulse 99  Temp(Src) 99.6 F (37.6 C) (Oral)  SpO2 100% Physical Exam  Nursing note and vitals reviewed. Constitutional: He is oriented to person, place, and time. He appears well-developed and well-nourished. No distress.  HENT:  Head: Normocephalic and atraumatic.  Mouth/Throat: Oropharynx is clear and moist.  Neck: Normal range of motion. Neck supple.  Cardiovascular: Normal rate, regular rhythm and normal heart sounds.   No murmur heard. Pulmonary/Chest: Effort normal and breath sounds normal. No respiratory distress. He has no wheezes. He exhibits tenderness.  There is tenderness to palpation in the left lateral chest wall.  Abdominal: Soft. Bowel sounds are normal. He exhibits no  distension. There is no tenderness.  Musculoskeletal: Normal range of motion. He exhibits no edema.  Lymphadenopathy:    He has no cervical adenopathy.  Neurological: He is alert and oriented to person, place, and time.  Skin: Skin is warm and dry. He is not diaphoretic.    ED Course  Procedures (including critical care time) Labs Review Labs Reviewed  CBC - Abnormal; Notable for the following:    RBC 4.15 (*)    Hemoglobin 12.6 (*)    HCT 34.7 (*)    MCHC 36.3 (*)    All other components within normal limits  BASIC METABOLIC PANEL - Abnormal; Notable for the following:    Sodium 136 (*)    Glucose, Bld 100 (*)    All other components within normal limits  POCT  I-STAT TROPONIN I   Imaging Review Dg Chest 2 View  04/02/2013   CLINICAL DATA:  Left-sided chest pain.  Leukocytosis.  HIV.  EXAM: CHEST  2 VIEW  COMPARISON:  10/19/2009  FINDINGS: New patchy airspace disease is seen in the left lung base, suspicious for developing pneumonia. No evidence of pleural effusion. Heart size is normal. No evidence of congestive heart failure.  IMPRESSION: New patchy left basilar airspace disease, suspicious for developing pneumonia.   Electronically Signed   By: Myles RosenthalJohn  Stahl M.D.   On: 04/02/2013 15:31    EKG Interpretation    Date/Time:  Monday April 02 2013 13:58:53 EST Ventricular Rate:  97 PR Interval:  138 QRS Duration: 84 QT Interval:  332 QTC Calculation: 421 R Axis:   66 Text Interpretation:  Normal sinus rhythm Normal ECG Confirmed by DELOS  MD, Samiyah Stupka ([id number]) on 04/02/2013 5:27:26 PM            MDM  No diagnosis found. Patient with history of HIV disease. He presents with complaints of left-sided chest pain, chest congestion, and productive cough for the past 2 days. Workup reveals low-grade fever and x-ray consistent with developing pneumonia in the left lung. He has no white count, hypoxia, and he does not appear toxic. I spoke with Dr. Ilsa IhaSnyder from infectious disease who recommends treatment for community-acquired pneumonia as the patient had a CD4 count of 350 last month. He understands to return if his symptoms worsen or he develops difficulty breathing.  I have considered but doubt pulmonary embolism. There is no hypoxia, tachycardia and he has no history of this.    Geoffery Lyonsouglas Matricia Begnaud, MD 04/02/13 1905  Geoffery Lyonsouglas Destenie Ingber, MD 04/02/13 [phone]

## 2013-04-02 NOTE — ED Notes (Signed)
Pt states woke up this am and took HIV meds, then went back to sleep and could barely move, sob, nauseated, and having chest pain on left side

## 2013-04-02 NOTE — Discharge Instructions (Signed)
Zithromax as prescribed.  Percocet as needed as prescribed for pain.  Return to the emergency department if you develop difficulty breathing, severe pain, or other new or concerning symptoms.     Pneumonia, Adult Pneumonia is an infection of the lungs.  CAUSES Pneumonia may be caused by bacteria or a virus. Usually, these infections are caused by breathing infectious particles into the lungs (respiratory tract). SYMPTOMS   Cough.  Fever.  Chest pain.  Increased rate of breathing.  Wheezing.  Mucus production. DIAGNOSIS  If you have the common symptoms of pneumonia, your caregiver will typically confirm the diagnosis with a chest X-ray. The X-ray will show an abnormality in the lung (pulmonary infiltrate) if you have pneumonia. Other tests of your blood, urine, or sputum may be done to find the specific cause of your pneumonia. Your caregiver may also do tests (blood gases or pulse oximetry) to see how well your lungs are working. TREATMENT  Some forms of pneumonia may be spread to other people when you cough or sneeze. You may be asked to wear a mask before and during your exam. Pneumonia that is caused by bacteria is treated with antibiotic medicine. Pneumonia that is caused by the influenza virus may be treated with an antiviral medicine. Most other viral infections must run their course. These infections will not respond to antibiotics.  PREVENTION A pneumococcal shot (vaccine) is available to prevent a common bacterial cause of pneumonia. This is usually suggested for:  People over 28 years old.  Patients on chemotherapy.  People with chronic lung problems, such as bronchitis or emphysema.  People with immune system problems. If you are over 65 or have a high risk condition, you may receive the pneumococcal vaccine if you have not received it before. In some countries, a routine influenza vaccine is also recommended. This vaccine can help prevent some cases of  pneumonia.You may be offered the influenza vaccine as part of your care. If you smoke, it is time to quit. You may receive instructions on how to stop smoking. Your caregiver can provide medicines and counseling to help you quit. HOME CARE INSTRUCTIONS   Cough suppressants may be used if you are losing too much rest. However, coughing protects you by clearing your lungs. You should avoid using cough suppressants if you can.  Your caregiver may have prescribed medicine if he or she thinks your pneumonia is caused by a bacteria or influenza. Finish your medicine even if you start to feel better.  Your caregiver may also prescribe an expectorant. This loosens the mucus to be coughed up.  Only take over-the-counter or prescription medicines for pain, discomfort, or fever as directed by your caregiver.  Do not smoke. Smoking is a common cause of bronchitis and can contribute to pneumonia. If you are a smoker and continue to smoke, your cough may last several weeks after your pneumonia has cleared.  A cold steam vaporizer or humidifier in your room or home may help loosen mucus.  Coughing is often worse at night. Sleeping in a semi-upright position in a recliner or using a couple pillows under your head will help with this.  Get rest as you feel it is needed. Your body will usually let you know when you need to rest. SEEK IMMEDIATE MEDICAL CARE IF:   Your illness becomes worse. This is especially true if you are elderly or weakened from any other disease.  You cannot control your cough with suppressants and are losing sleep.  You  begin coughing up blood.  You develop pain which is getting worse or is uncontrolled with medicines.  You have a fever.  Any of the symptoms which initially brought you in for treatment are getting worse rather than better.  You develop shortness of breath or chest pain. MAKE SURE YOU:   Understand these instructions.  Will watch your condition.  Will get  help right away if you are not doing well or get worse. Document Released: 03/01/2005 Document Revised: 05/24/2011 Document Reviewed: 05/21/2010 [facility] Patient Information 2014 [place], [place].

## 2013-04-05 ENCOUNTER — Other Ambulatory Visit: Payer: Self-pay

## 2013-04-05 ENCOUNTER — Other Ambulatory Visit: Payer: Self-pay | Admitting: Internal Medicine

## 2013-04-05 DIAGNOSIS — B20 Human immunodeficiency virus [HIV] disease: Secondary | ICD-10-CM

## 2013-04-09 ENCOUNTER — Other Ambulatory Visit (INDEPENDENT_AMBULATORY_CARE_PROVIDER_SITE_OTHER): Payer: Self-pay

## 2013-04-09 DIAGNOSIS — B20 Human immunodeficiency virus [HIV] disease: Secondary | ICD-10-CM

## 2013-04-10 LAB — T-HELPER CELL (CD4) - (RCID CLINIC ONLY)
CD4 % Helper T Cell: 11 % — ABNORMAL LOW (ref 33–55)
CD4 T Cell Abs: 310 /uL — ABNORMAL LOW (ref 400–2700)

## 2013-04-10 LAB — HIV-1 RNA QUANT-NO REFLEX-BLD
HIV 1 RNA Quant: 494 copies/mL — ABNORMAL HIGH (ref <20)
HIV-1 RNA Quant, Log: 2.69 {Log} — ABNORMAL HIGH (ref <1.30)

## 2013-04-11 ENCOUNTER — Emergency Department (HOSPITAL_COMMUNITY): Payer: Self-pay

## 2013-04-11 ENCOUNTER — Encounter (HOSPITAL_COMMUNITY): Payer: Self-pay | Admitting: Emergency Medicine

## 2013-04-11 ENCOUNTER — Emergency Department (HOSPITAL_COMMUNITY)
Admission: EM | Admit: 2013-04-11 | Discharge: 2013-04-11 | Disposition: A | Discharge status: Home / Self Care | Payer: Self-pay | Attending: Emergency Medicine | Admitting: Emergency Medicine

## 2013-04-11 DIAGNOSIS — J189 Pneumonia, unspecified organism: Secondary | ICD-10-CM | POA: Insufficient documentation

## 2013-04-11 DIAGNOSIS — R197 Diarrhea, unspecified: Secondary | ICD-10-CM | POA: Insufficient documentation

## 2013-04-11 DIAGNOSIS — J45901 Unspecified asthma with (acute) exacerbation: Secondary | ICD-10-CM | POA: Insufficient documentation

## 2013-04-11 DIAGNOSIS — F17200 Nicotine dependence, unspecified, uncomplicated: Secondary | ICD-10-CM | POA: Insufficient documentation

## 2013-04-11 DIAGNOSIS — R61 Generalized hyperhidrosis: Secondary | ICD-10-CM | POA: Insufficient documentation

## 2013-04-11 DIAGNOSIS — Z21 Asymptomatic human immunodeficiency virus [HIV] infection status: Secondary | ICD-10-CM | POA: Insufficient documentation

## 2013-04-11 DIAGNOSIS — Z792 Long term (current) use of antibiotics: Secondary | ICD-10-CM | POA: Insufficient documentation

## 2013-04-11 DIAGNOSIS — B20 Human immunodeficiency virus [HIV] disease: Secondary | ICD-10-CM

## 2013-04-11 DIAGNOSIS — I10 Essential (primary) hypertension: Secondary | ICD-10-CM | POA: Insufficient documentation

## 2013-04-11 DIAGNOSIS — Z79899 Other long term (current) drug therapy: Secondary | ICD-10-CM | POA: Insufficient documentation

## 2013-04-11 LAB — CBC WITH DIFFERENTIAL/PLATELET
Basophils Absolute: 0 10*3/uL (ref 0.0–0.1)
Basophils Relative: 0 % (ref 0–1)
Eosinophils Absolute: 0 10*3/uL (ref 0.0–0.7)
Eosinophils Relative: 0 % (ref 0–5)
HCT: 31.7 % — ABNORMAL LOW (ref 39.0–52.0)
HEMOGLOBIN: 11.2 g/dL — AB (ref 13.0–17.0)
LYMPHS PCT: 13 % (ref 12–46)
Lymphs Abs: 1.4 10*3/uL (ref 0.7–4.0)
MCH: 29.6 pg (ref 26.0–34.0)
MCHC: 35.3 g/dL (ref 30.0–36.0)
MCV: 83.6 fL (ref 78.0–100.0)
MONOS PCT: 9 % (ref 3–12)
Monocytes Absolute: 1 10*3/uL (ref 0.1–1.0)
NEUTROS ABS: 8.5 10*3/uL — AB (ref 1.7–7.7)
Neutrophils Relative %: 78 % — ABNORMAL HIGH (ref 43–77)
PLATELETS: 265 10*3/uL (ref 150–400)
RBC: 3.79 MIL/uL — AB (ref 4.22–5.81)
RDW: 13.8 % (ref 11.5–15.5)
WBC: 11 10*3/uL — AB (ref 4.0–10.5)

## 2013-04-11 LAB — BASIC METABOLIC PANEL
BUN: 9 mg/dL (ref 6–23)
CALCIUM: 8.9 mg/dL (ref 8.4–10.5)
CHLORIDE: 97 meq/L (ref 96–112)
CO2: 24 meq/L (ref 19–32)
Creatinine, Ser: 1.02 mg/dL (ref 0.50–1.35)
GFR calc non Af Amer: >90 mL/min (ref >90)
GLUCOSE: 105 mg/dL — AB (ref 70–99)
Potassium: 4 mEq/L (ref 3.7–5.3)
Sodium: 132 mEq/L — ABNORMAL LOW (ref 137–147)

## 2013-04-11 LAB — D-DIMER, QUANTITATIVE (NOT AT ARMC): D DIMER QUANT: 1.18 ug{FEU}/mL — AB (ref 0.00–0.48)

## 2013-04-11 MED ORDER — SODIUM CHLORIDE 0.9 % IV BOLUS (SEPSIS)
1000.0000 mL | Freq: Once | INTRAVENOUS | Status: AC
  Administered 2013-04-11: 1000 mL via INTRAVENOUS

## 2013-04-11 MED ORDER — HYDROMORPHONE HCL PF 1 MG/ML IJ SOLN
1.0000 mg | Freq: Once | INTRAMUSCULAR | Status: AC
  Administered 2013-04-11: 1 mg via INTRAVENOUS
  Filled 2013-04-11: qty 1

## 2013-04-11 MED ORDER — NAPROXEN 500 MG PO TABS: 500.0000 mg | ORAL_TABLET | Freq: Two times a day (BID) | ORAL | Status: DC

## 2013-04-11 MED ORDER — IOHEXOL 350 MG/ML SOLN
100.0000 mL | Freq: Once | INTRAVENOUS | Status: AC | PRN
  Administered 2013-04-11: 100 mL via INTRAVENOUS

## 2013-04-11 MED ORDER — IBUPROFEN 800 MG PO TABS
800.0000 mg | ORAL_TABLET | Freq: Once | ORAL | Status: AC
  Administered 2013-04-11: 800 mg via ORAL
  Filled 2013-04-11: qty 1

## 2013-04-11 MED ORDER — HYDROCODONE-ACETAMINOPHEN 5-325 MG PO TABS: 1.0000 | ORAL_TABLET | Freq: Four times a day (QID) | ORAL | Status: DC | PRN

## 2013-04-11 MED ORDER — ONDANSETRON HCL 4 MG/2ML IJ SOLN
4.0000 mg | Freq: Once | INTRAMUSCULAR | Status: AC
  Administered 2013-04-11: 4 mg via INTRAVENOUS
  Filled 2013-04-11: qty 2

## 2013-04-11 MED ORDER — SULFAMETHOXAZOLE-TRIMETHOPRIM 800-160 MG PO TABS: 1.0000 | ORAL_TABLET | Freq: Two times a day (BID) | ORAL | Status: DC

## 2013-04-11 MED ORDER — SODIUM CHLORIDE 0.9 % IV SOLN: INTRAVENOUS | Status: DC

## 2013-04-11 MED ORDER — LEVOFLOXACIN 500 MG PO TABS: 750.0000 mg | ORAL_TABLET | Freq: Every day | ORAL | Status: DC

## 2013-04-11 MED ORDER — DM-GUAIFENESIN ER 30-600 MG PO TB12: 1.0000 | ORAL_TABLET | Freq: Two times a day (BID) | ORAL | Status: DC

## 2013-04-11 MED ORDER — LEVOFLOXACIN IN D5W 750 MG/150ML IV SOLN
750.0000 mg | Freq: Once | INTRAVENOUS | Status: AC
  Administered 2013-04-11: 750 mg via INTRAVENOUS
  Filled 2013-04-11: qty 150

## 2013-04-11 NOTE — ED Provider Notes (Signed)
CSN: [id number]     Arrival date & time 04/11/13  1015 History  This chart was scribed for Phillip JakesScott W. Lexys Milliner, MD by [name], ED Scribe. This patient was seen in room APA05/APA05 and the patient's care was started at 10:32 AM.    Chief Complaint  Patient presents with  . Chest Pain  . Cough   The history is provided by the patient. No language interpreter was used.   HPI Comments: Phillip Morales is a 28 y.o. male who presents to the Emergency Department complaining of contant left-sided CP that radiates to the left side onset this morning with SOB and cough. Pt reports he was [facility] with [facility]. He was treated with zithromax with  improvement until today. He rates the CP as a 10/10. He states the pain worsens with breathing. He reports 3 episodes diarrhea this morning. He also reports fatigue, diaphoresis, subjective fever.   Past Medical History  Diagnosis Date  . Asthma   . HIV (human immunodeficiency virus infection)   . Hypertension    History reviewed. No pertinent past surgical history. Family History  Problem Relation Age of Onset  . Kidney disease Neg Hx    History  Substance Use Topics  . Smoking status: Current Some Day Smoker -- 0.25 packs/day    Types: Cigarettes  . Smokeless tobacco: Not on file     Comment: contemplating a quit date  . Alcohol Use: No    Review of Systems  Constitutional: Positive for fever, diaphoresis and fatigue. Negative for chills.  HENT: Positive for congestion. Negative for rhinorrhea and sore throat.   Eyes: Negative for visual disturbance.  Respiratory: Positive for cough and shortness of breath.   Cardiovascular: Positive for chest pain. Negative for leg swelling.  Gastrointestinal: Positive for diarrhea. Negative for nausea, vomiting, abdominal pain and blood in stool.  Genitourinary: Negative for dysuria and hematuria.  Musculoskeletal: Negative for back pain and neck pain.  Skin: Negative for  rash.  Neurological: Positive for headaches.  Hematological: Does not bruise/bleed easily.  Psychiatric/Behavioral: Negative for confusion.    Allergies  Review of patient's allergies indicates no known allergies.  Home Medications   Current Outpatient Rx  Name  Route  Sig  Dispense  Refill  . amLODipine (NORVASC) 5 MG tablet   Oral   Take 1 tablet (5 mg total) by mouth daily.   30 tablet   11     Once adap approved   . Darunavir Ethanolate (PREZISTA) 800 MG tablet   Oral   Take 1 tablet (800 mg total) by mouth daily.   30 tablet   5     [facility] Case #[id number] Auth #[id number]   . emtricitabine-tenofovir (TRUVADA) 200-300 MG per tablet   Oral   Take 1 tablet by mouth daily.   30 tablet   5   . ritonavir (NORVIR) 100 MG TABS tablet   Oral   Take 1 tablet (100 mg total) by mouth daily.   30 tablet   5   . azithromycin (ZITHROMAX) 250 MG tablet   Oral   Take 1 tablet (250 mg total) by mouth daily.   4 tablet   0   . dextromethorphan-guaiFENesin (MUCINEX DM) 30-600 MG per 12 hr tablet   Oral   Take 1 tablet by mouth 2 (two) times daily.   14 tablet   0   . HYDROcodone-acetaminophen (NORCO/VICODIN) 5-325 MG per tablet   Oral   Take  1-2 tablets by mouth every 6 (six) hours as needed for moderate pain.   20 tablet   0   . levofloxacin (LEVAQUIN) 500 MG tablet   Oral   Take 1.5 tablets (750 mg total) by mouth daily.   7 tablet   0   . naproxen (NAPROSYN) 500 MG tablet   Oral   Take 1 tablet (500 mg total) by mouth 2 (two) times daily.   14 tablet   0   . oxyCODONE-acetaminophen (PERCOCET) 5-325 MG per tablet   Oral   Take 2 tablets by mouth every 4 (four) hours as needed.   15 tablet   0   . sulfamethoxazole-trimethoprim (SEPTRA DS) 800-160 MG per tablet   Oral   Take 1 tablet by mouth 2 (two) times daily.   42 tablet   0    BP 109/93  Pulse 96  Temp(Src) 99.2 F (37.3 C) (Oral)  Resp 22  SpO2 98% Physical Exam  Nursing note and vitals  reviewed. Constitutional: He is oriented to person, place, and time. He appears well-developed and well-nourished. No distress.  HENT:  Head: Normocephalic and atraumatic.  Mouth/Throat: Oropharynx is clear and moist.  Eyes: EOM are normal. No scleral icterus.  Neck: Neck supple. No tracheal deviation present.  Cardiovascular: Normal rate and regular rhythm.   Pulmonary/Chest: Effort normal and breath sounds normal. No respiratory distress. He has no wheezes.  Abdominal: Soft. Bowel sounds are normal. There is no tenderness.  Musculoskeletal: Normal range of motion. He exhibits no edema.  Neurological: He is alert and oriented to person, place, and time.  Skin: Skin is warm and dry.  Psychiatric: He has a normal mood and affect. His behavior is normal.    ED Course  Procedures (including critical care time) Medications  0.9 %  sodium chloride infusion (not administered)  levofloxacin (LEVAQUIN) IVPB 750 mg (not administered)  HYDROmorphone (DILAUDID) injection 1 mg (not administered)  ondansetron (ZOFRAN) injection 4 mg (not administered)  ondansetron (ZOFRAN) injection 4 mg (4 mg Intravenous Given 04/11/13 1136)  HYDROmorphone (DILAUDID) injection 1 mg (1 mg Intravenous Given 04/11/13 1136)  sodium chloride 0.9 % bolus 1,000 mL (0 mLs Intravenous Stopped 04/11/13 1240)  iohexol (OMNIPAQUE) 350 MG/ML injection 100 mL (100 mLs Intravenous Contrast Given 04/11/13 1251)    DIAGNOSTIC STUDIES: Oxygen Saturation is 98% on RA, normal by my interpretation.    COORDINATION OF CARE: 10:49 AM- Discussed treatment plan with pt which includes IV fluids, zofran, pain medication, CXR. Pt agrees to plan.    Labs Review Labs Reviewed  CBC WITH DIFFERENTIAL - Abnormal; Notable for the following:    WBC 11.0 (*)    RBC 3.79 (*)    Hemoglobin 11.2 (*)    HCT 31.7 (*)    Neutrophils Relative % 78 (*)    Neutro Abs 8.5 (*)    All other components within normal limits  BASIC METABOLIC PANEL -  Abnormal; Notable for the following:    Sodium 132 (*)    Glucose, Bld 105 (*)    All other components within normal limits  D-DIMER, QUANTITATIVE - Abnormal; Notable for the following:    D-Dimer, Quant 1.18 (*)    All other components within normal limits   Imaging Review Dg Chest 2 View  04/11/2013   CLINICAL DATA:  Chest pain and cough  EXAM: CHEST  2 VIEW  COMPARISON:  None.  FINDINGS: The heart size and mediastinal contours are within normal limits. Both lungs  are clear. The visualized skeletal structures are unremarkable.  IMPRESSION: No active cardiopulmonary disease.   Electronically Signed   By: Marlan Palau M.D.   On: 04/11/2013 11:26   Ct Angio Chest Pe W/cm &/or Wo Cm  04/11/2013   CLINICAL DATA:  Chest pain and short of breath  EXAM: CT ANGIOGRAPHY CHEST WITH CONTRAST  TECHNIQUE: Multidetector CT imaging of the chest was performed using the standard protocol during bolus administration of intravenous contrast. Multiplanar CT image reconstructions including MIPs were obtained to evaluate the vascular anatomy.  CONTRAST:  OMNIPAQUE IOHEXOL 350 MG/ML SOLN  COMPARISON:  Chest x-ray today  FINDINGS: Negative for pulmonary embolism. Negative for aortic dissection or aneurysm. Mild cardiac enlargement without pericardial effusion.  Left lower lobe consolidation, most likely due to pneumonia. This is unchanged from the chest x-ray earlier today. Mild right lower lobe atelectasis or infiltrate. No effusion identified. Negative for adenopathy.  Review of the MIP images confirms the above findings.  IMPRESSION: Negative for pulmonary embolism.  Left lower lobe infiltrate compatible with pneumonia. No effusion present.   Electronically Signed   By: Marlan Palau M.D.   On: 04/11/2013 13:32    EKG Interpretation    Date/Time:  Wednesday April 11 2013 11:18:35 EST Ventricular Rate:  90 PR Interval:  144 QRS Duration: 94 QT Interval:  342 QTC Calculation: 418 R Axis:   64 Text  Interpretation:  Normal sinus rhythm Normal ECG When compared with ECG of 02-Apr-2013 13:58, No significant change was found Confirmed by Youa Deloney  MD, Charina Fons ([id number]) on 04/11/2013 11:32:51 AM            MDM   1. Recurrent pneumonia   2. HIV (human immunodeficiency virus infection)     Patient's chest x-ray and CT scan consistent with pneumonia. Patient recently treated with a Z-Pak for pneumonia did show signs of improvement then today started feeling bad again. As noted patient is HIV positive. D-dimer was elevated CT scan is negative for pulmonary embolus. Suspect that the pain on the left side is due to pleuritic irritation. Will change patient over to Levaquin first dose provided here and will throw in Septra DS just in case PCP component until he can followup with ID. Patient is not toxic. Patient's white blood cell count is elevated no evidence of significant low white blood cells. Patient will require close followup.   I personally performed the services described in this documentation, which was scribed in my presence. The recorded information has been reviewed and is accurate.    Phillip Jakes, MD 04/11/13 [phone]

## 2013-04-11 NOTE — Discharge Instructions (Signed)
Will switch to Levaquin and also treat you with Septra at least initially in case it's a PCP pneumonia but clinically doesn't appear to be that. Take the Levaquin as directed for the next 7 days. Recommended she call and make an appointment to followup with your text disease person is sometime in the next few days. Would recommend that you please contact them tomorrow. Return for any newer worse symptoms. Take medications as directed.

## 2013-04-11 NOTE — ED Notes (Signed)
Pt c/o left side chest pain as well as cough. Pt states he was dx with pneumonia last week. Symptoms have not improved after abx tx.

## 2013-04-12 ENCOUNTER — Ambulatory Visit (INDEPENDENT_AMBULATORY_CARE_PROVIDER_SITE_OTHER): Payer: Self-pay | Admitting: Internal Medicine

## 2013-04-12 ENCOUNTER — Encounter: Payer: Self-pay | Admitting: Internal Medicine

## 2013-04-12 VITALS — BP 152/91 | HR 103 | Temp 98.8°F | Ht 67.0 in | Wt 189.0 lb

## 2013-04-12 DIAGNOSIS — B20 Human immunodeficiency virus [HIV] disease: Secondary | ICD-10-CM

## 2013-04-12 DIAGNOSIS — B370 Candidal stomatitis: Secondary | ICD-10-CM | POA: Insufficient documentation

## 2013-04-12 DIAGNOSIS — J189 Pneumonia, unspecified organism: Secondary | ICD-10-CM | POA: Insufficient documentation

## 2013-04-12 MED ORDER — LEVOFLOXACIN 750 MG PO TABS: 750.0000 mg | ORAL_TABLET | Freq: Every day | ORAL | Status: DC

## 2013-04-12 MED ORDER — FLUCONAZOLE 100 MG PO TABS: 100.0000 mg | ORAL_TABLET | Freq: Every day | ORAL | Status: DC

## 2013-04-12 NOTE — Assessment & Plan Note (Signed)
I suspect his virus is detectable since he had been off his medications. I will have him recheck it in 2 weeks and I'll followup with him in 3 weeks.

## 2013-04-12 NOTE — Progress Notes (Signed)
   Subjective:    Patient ID: Phillip Morales, male    DOB: 03/10/1986, 28 y.o.   MRN: [id number]  HPI Her for followup  Of his HIV. He previously was in jail and was on therapy with that the with on undetectable viral load. I then saw him at the end of 2014 and he was on Prezista, Norvir and Truvada. He though had no coverage for most of December and tells me he was out of his medicines until early January though according to the pharmacy, he did not receive his medicines until at least mid January. He though denies any missed doses since he's been on them. He does have a detectable virus of over 400 copies. His CD4 also has dropped from 350 down to 310.  He otherwise has been in the emergency room twice for cough and pleuritic chest pain. He did get a CAT scan which noted lobar pneumonia. He does had no fever or chills. She's had persistent symptoms for more than a week.   Review of Systems  Constitutional: Negative for fever, chills and fatigue.  Respiratory: Positive for cough. Negative for shortness of breath and wheezing.   Gastrointestinal: Negative for nausea and diarrhea.  Skin: Negative for rash.  Neurological: Negative for dizziness and light-headedness.  Hematological: Negative for adenopathy.       Objective:   Physical Exam  Constitutional: He appears well-developed and well-nourished. No distress.  HENT:  Thrush on tongue  Eyes: No scleral icterus.  Cardiovascular: Normal rate, regular rhythm and normal heart sounds.   No murmur heard. Pulmonary/Chest: Effort normal and breath sounds normal. No respiratory distress. He has no wheezes.  Lymphadenopathy:    He has no cervical adenopathy.  Skin: No rash noted.          Assessment & Plan:

## 2013-04-12 NOTE — Assessment & Plan Note (Signed)
He seems to have persistent symptoms and has been unable to fill Levaquin do to cost. I will send it to the [facility].

## 2013-04-12 NOTE — Assessment & Plan Note (Signed)
I will give him fluconazole

## 2013-04-16 ENCOUNTER — Other Ambulatory Visit: Payer: Self-pay

## 2013-04-17 ENCOUNTER — Other Ambulatory Visit (INDEPENDENT_AMBULATORY_CARE_PROVIDER_SITE_OTHER): Payer: Self-pay

## 2013-04-17 ENCOUNTER — Other Ambulatory Visit: Payer: Self-pay | Admitting: Licensed Clinical Social Worker

## 2013-04-17 DIAGNOSIS — B20 Human immunodeficiency virus [HIV] disease: Secondary | ICD-10-CM

## 2013-04-30 DIAGNOSIS — B20 Human immunodeficiency virus [HIV] disease: Secondary | ICD-10-CM

## 2013-05-01 ENCOUNTER — Other Ambulatory Visit: Payer: Self-pay

## 2013-05-01 ENCOUNTER — Ambulatory Visit: Payer: Self-pay | Admitting: Internal Medicine

## 2013-05-02 ENCOUNTER — Other Ambulatory Visit (INDEPENDENT_AMBULATORY_CARE_PROVIDER_SITE_OTHER): Payer: Self-pay

## 2013-05-02 DIAGNOSIS — B20 Human immunodeficiency virus [HIV] disease: Secondary | ICD-10-CM

## 2013-05-03 ENCOUNTER — Ambulatory Visit: Payer: Self-pay

## 2013-05-03 LAB — T-HELPER CELL (CD4) - (RCID CLINIC ONLY)
CD4 T CELL HELPER: 16 % — AB (ref 33–55)
CD4 T Cell Abs: 320 /uL — ABNORMAL LOW (ref 400–2700)

## 2013-05-05 LAB — HIV-1 RNA ULTRAQUANT REFLEX TO GENTYP+
HIV 1 RNA Quant: 93 copies/mL — ABNORMAL HIGH (ref <20)
HIV-1 RNA Quant, Log: 1.97 {Log} — ABNORMAL HIGH (ref <1.30)

## 2013-06-05 ENCOUNTER — Ambulatory Visit: Payer: Self-pay | Admitting: Internal Medicine

## 2013-06-22 ENCOUNTER — Other Ambulatory Visit: Payer: Self-pay | Admitting: *Deleted

## 2013-06-22 ENCOUNTER — Encounter: Payer: Self-pay | Admitting: *Deleted

## 2013-06-22 DIAGNOSIS — B20 Human immunodeficiency virus [HIV] disease: Secondary | ICD-10-CM

## 2013-06-22 MED ORDER — DARUNAVIR ETHANOLATE 800 MG PO TABS: 800.0000 mg | ORAL_TABLET | Freq: Every day | ORAL | Status: DC

## 2013-06-22 MED ORDER — RITONAVIR 100 MG PO TABS: 100.0000 mg | ORAL_TABLET | Freq: Every day | ORAL | Status: DC

## 2013-06-22 MED ORDER — EMTRICITABINE-TENOFOVIR DF 200-300 MG PO TABS: 1.0000 | ORAL_TABLET | Freq: Every day | ORAL | Status: DC

## 2013-06-24 ENCOUNTER — Emergency Department (HOSPITAL_COMMUNITY): Payer: Self-pay

## 2013-06-24 ENCOUNTER — Encounter (HOSPITAL_COMMUNITY): Payer: Self-pay | Admitting: Emergency Medicine

## 2013-06-24 ENCOUNTER — Emergency Department (HOSPITAL_COMMUNITY)
Admission: EM | Admit: 2013-06-24 | Discharge: 2013-06-24 | Disposition: A | Discharge status: Home / Self Care | Payer: Self-pay | Attending: Emergency Medicine | Admitting: Emergency Medicine

## 2013-06-24 DIAGNOSIS — L0231 Cutaneous abscess of buttock: Secondary | ICD-10-CM | POA: Insufficient documentation

## 2013-06-24 DIAGNOSIS — Z79899 Other long term (current) drug therapy: Secondary | ICD-10-CM | POA: Insufficient documentation

## 2013-06-24 DIAGNOSIS — J40 Bronchitis, not specified as acute or chronic: Secondary | ICD-10-CM

## 2013-06-24 DIAGNOSIS — J45909 Unspecified asthma, uncomplicated: Secondary | ICD-10-CM | POA: Insufficient documentation

## 2013-06-24 DIAGNOSIS — F17200 Nicotine dependence, unspecified, uncomplicated: Secondary | ICD-10-CM | POA: Insufficient documentation

## 2013-06-24 DIAGNOSIS — L03317 Cellulitis of buttock: Principal | ICD-10-CM

## 2013-06-24 DIAGNOSIS — Z21 Asymptomatic human immunodeficiency virus [HIV] infection status: Secondary | ICD-10-CM | POA: Insufficient documentation

## 2013-06-24 DIAGNOSIS — I10 Essential (primary) hypertension: Secondary | ICD-10-CM | POA: Insufficient documentation

## 2013-06-24 LAB — CBC WITH DIFFERENTIAL/PLATELET
Basophils Absolute: 0 10*3/uL (ref 0.0–0.1)
Basophils Relative: 0 % (ref 0–1)
Eosinophils Absolute: 0 10*3/uL (ref 0.0–0.7)
Eosinophils Relative: 0 % (ref 0–5)
HCT: 33.1 % — ABNORMAL LOW (ref 39.0–52.0)
Hemoglobin: 12.1 g/dL — ABNORMAL LOW (ref 13.0–17.0)
LYMPHS ABS: 1 10*3/uL (ref 0.7–4.0)
Lymphocytes Relative: 10 % — ABNORMAL LOW (ref 12–46)
MCH: 30.8 pg (ref 26.0–34.0)
MCHC: 36.6 g/dL — ABNORMAL HIGH (ref 30.0–36.0)
MCV: 84.2 fL (ref 78.0–100.0)
Monocytes Absolute: 1.1 10*3/uL — ABNORMAL HIGH (ref 0.1–1.0)
Monocytes Relative: 11 % (ref 3–12)
NEUTROS PCT: 78 % — AB (ref 43–77)
Neutro Abs: 7.9 10*3/uL — ABNORMAL HIGH (ref 1.7–7.7)
Platelets: 174 10*3/uL (ref 150–400)
RBC: 3.93 MIL/uL — AB (ref 4.22–5.81)
RDW: 14.2 % (ref 11.5–15.5)
WBC: 10.1 10*3/uL (ref 4.0–10.5)

## 2013-06-24 LAB — BASIC METABOLIC PANEL
BUN: 7 mg/dL (ref 6–23)
CO2: 25 mEq/L (ref 19–32)
CREATININE: 1.03 mg/dL (ref 0.50–1.35)
Calcium: 9.2 mg/dL (ref 8.4–10.5)
Chloride: 98 mEq/L (ref 96–112)
GFR calc Af Amer: >90 mL/min (ref >90)
Glucose, Bld: 115 mg/dL — ABNORMAL HIGH (ref 70–99)
POTASSIUM: 3.7 meq/L (ref 3.7–5.3)
Sodium: 137 mEq/L (ref 137–147)

## 2013-06-24 MED ORDER — MORPHINE SULFATE 4 MG/ML IJ SOLN
4.0000 mg | Freq: Once | INTRAMUSCULAR | Status: AC
  Administered 2013-06-24: 4 mg via INTRAMUSCULAR
  Filled 2013-06-24: qty 1

## 2013-06-24 MED ORDER — SULFAMETHOXAZOLE-TRIMETHOPRIM 800-160 MG PO TABS: 1.0000 | ORAL_TABLET | Freq: Two times a day (BID) | ORAL | Status: DC

## 2013-06-24 MED ORDER — OXYCODONE-ACETAMINOPHEN 5-325 MG PO TABS: 1.0000 | ORAL_TABLET | Freq: Four times a day (QID) | ORAL | Status: DC | PRN

## 2013-06-24 MED ORDER — LORAZEPAM 2 MG/ML IJ SOLN
1.0000 mg | Freq: Once | INTRAMUSCULAR | Status: AC
  Administered 2013-06-24: 1 mg via INTRAMUSCULAR
  Filled 2013-06-24: qty 1

## 2013-06-24 MED ORDER — ONDANSETRON 4 MG PO TBDP
4.0000 mg | ORAL_TABLET | Freq: Once | ORAL | Status: AC
  Administered 2013-06-24: 4 mg via ORAL
  Filled 2013-06-24: qty 1

## 2013-06-24 NOTE — ED Provider Notes (Signed)
CSN: [id number]     Arrival date & time 06/24/13  0706 History   First MD Initiated Contact with Patient 06/24/13 0715     Chief Complaint  Patient presents with  . Abscess     (Consider location/radiation/quality/duration/timing/severity/associated sxs/prior Treatment) HPI Patient reports a history of boils or abscesses on his buttock/perirectal area several times. He relates he's only had them lanced once on the right and once on the left. He reports the others improved with just antibiotics. He reports 2-3 days ago he started getting pain and tenderness in his left buttock. He states it has not drained yet. He also describes some sweats without known fever, however he also describes some chills. He also started having a cough last night with green mucus production. He denies feeling short of breath, having a sore throat, or sneezing. He has some mild clear rhinorrhea. He denies nausea, vomiting, or diarrhea. He states he had his first episode of pneumonia about 3-4 months ago. He was treated as an outpatient with several antibiotics.   PCP Dr Luciana Axe   Past Medical History  Diagnosis Date  . Asthma   . HIV (human immunodeficiency virus infection)   . Hypertension    History reviewed. No pertinent past surgical history. Family History  Problem Relation Age of Onset  . Kidney disease Neg Hx    History  Substance Use Topics  . Smoking status: Current Some Day Smoker -- 0.25 packs/day    Types: Cigarettes    Start date: 03/03/2013  . Smokeless tobacco: Never Used     Comment: no longer smokes  . 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     Allergies  Review of patient's allergies indicates no known allergies.  Home Medications   Current Outpatient Rx  Name  Route  Sig  Dispense  Refill  . amLODipine (NORVASC) 5 MG tablet   Oral   Take 5 mg by mouth daily.           . Darunavir Ethanolate (PREZISTA) 800 MG tablet   Oral   Take 1 tablet (800 mg total) by mouth daily.   30 tablet   5   . emtricitabine-tenofovir (TRUVADA) 200-300 MG per tablet   Oral   Take 1 tablet by mouth daily.   30 tablet   5   . ibuprofen (ADVIL,MOTRIN) 200 MG tablet   Oral   Take 400-600 mg by mouth every 8 (eight) hours as needed for moderate pain.         . ritonavir (NORVIR) 100 MG TABS tablet   Oral   Take 1 tablet (100 mg total) by mouth daily.   30 tablet   5    BP 132/66  Pulse 87  Temp(Src) 99 F (37.2 C) (Oral)  Resp 18  SpO2 100%  Vital signs normal except low-grade temp  Physical Exam  Nursing note and vitals reviewed. Constitutional: He is oriented to person, place, and time. He appears well-developed and well-nourished.  Non-toxic appearance. He does not appear ill. No distress.  HENT:  Head: Normocephalic and atraumatic.  Right Ear: External ear normal.  Left Ear: External ear normal.  Nose: Nose normal. No mucosal edema or rhinorrhea.  Mouth/Throat: Oropharynx is clear and moist and mucous membranes are normal. No dental abscesses or uvula swelling.  Eyes: Conjunctivae and EOM are normal. Pupils are equal, round, and  reactive to light.  Neck: Normal range of motion and full passive range of motion without pain. Neck supple.  Cardiovascular: Normal rate, regular rhythm and normal heart sounds.  Exam reveals no gallop and no friction rub.   No murmur heard. Pulmonary/Chest: Effort normal and breath sounds normal. No respiratory distress. He has no wheezes. He has no rhonchi. He has no rales. He exhibits no tenderness and no crepitus.  Abdominal: Soft. Normal appearance and bowel sounds are normal. He exhibits no distension. There is no tenderness. There is no rebound and no guarding.  Genitourinary:  The patient has induration with tenderness of his medial left buttock with some redness of the skin. The size appears to be about 3 cm. It is  not connected to the rectal area  Musculoskeletal: Normal range of motion. He exhibits no edema and no tenderness.       Legs: Moves all extremities well.   Neurological: He is alert and oriented to person, place, and time. He has normal strength. No cranial nerve deficit.  Skin: Skin is warm, dry and intact. No rash noted. No erythema. No pallor.  Psychiatric: He has a normal mood and affect. His speech is normal and behavior is normal. His mood appears not anxious.    ED Course  Procedures (including critical care time)  Medications  morphine 4 MG/ML injection 4 mg (4 mg Intramuscular Given 06/24/13 0742)  ondansetron (ZOFRAN-ODT) disintegrating tablet 4 mg (4 mg Oral Given 06/24/13 0742)  morphine 4 MG/ML injection 4 mg (4 mg Intramuscular Given 06/24/13 0907)  LORazepam (ATIVAN) injection 1 mg (1 mg Intramuscular Given 06/24/13 0907)    Pt given his test results and discussed discharge instructions.   I & D of abscess done by PA   Labs Review  Results for orders placed during the hospital encounter of 06/24/13  CBC WITH DIFFERENTIAL      Result Value Ref Range   WBC 10.1  4.0 - 10.5 K/uL   RBC 3.93 (*) 4.22 - 5.81 MIL/uL   Hemoglobin 12.1 (*) 13.0 - 17.0 g/dL   HCT 96.033.1 (*) 45.439.0 - 09.852.0 %   MCV 84.2  78.0 - 100.0 fL   MCH 30.8  26.0 - 34.0 pg   MCHC 36.6 (*) 30.0 - 36.0 g/dL   RDW 11.914.2  14.711.5 - 82.915.5 %   Platelets 174  150 - 400 K/uL   Neutrophils Relative % 78 (*) 43 - 77 %   Neutro Abs 7.9 (*) 1.7 - 7.7 K/uL   Lymphocytes Relative 10 (*) 12 - 46 %   Lymphs Abs 1.0  0.7 - 4.0 K/uL   Monocytes Relative 11  3 - 12 %   Monocytes Absolute 1.1 (*) 0.1 - 1.0 K/uL   Eosinophils Relative 0  0 - 5 %   Eosinophils Absolute 0.0  0.0 - 0.7 K/uL   Basophils Relative 0  0 - 1 %   Basophils Absolute 0.0  0.0 - 0.1 K/uL  BASIC METABOLIC PANEL      Result Value Ref Range   Sodium 137  137 - 147 mEq/L   Potassium 3.7  3.7 - 5.3 mEq/L   Chloride 98  96 - 112 mEq/L   CO2 25  19 - 32  mEq/L   Glucose, Bld 115 (*) 70 - 99 mg/dL   BUN 7  6 - 23 mg/dL   Creatinine, Ser 5.621.03  0.50 - 1.35 mg/dL   Calcium 9.2  8.4 - 13.010.5 mg/dL  GFR calc non Af Amer >90  >90 mL/min   GFR calc Af Amer >90  >90 mL/min    Laboratory interpretation all normal except mild anemia, normal total white blood cell count    Results for orders placed in visit on 05/02/13  HIV-1 RNA ULTRAQUANT REFLEX TO GENTYP+      Result Value Ref Range   HIV 1 RNA Quant 93 (*) <20 copies/mL   HIV1 RNA Quant, Log 1.97 (*) <1.30 log 10  T-HELPER CELL (CD4)      Result Value Ref Range   CD4 T Cell Abs 320 (*) 400 - 2700 /uL   CD4 % Helper T Cell 16 (*) 33 - 55 %     Imaging Review Dg Chest 2 View  06/24/2013   CLINICAL DATA:  Cough and sweats. Question pneumonia. HIV-positive.  EXAM: CHEST  2 VIEW  COMPARISON:  Chest CT 04/11/2013.  Chest radiograph 04/11/2013.  FINDINGS: The heart and mediastinal contours are within normal limits. The pulmonary vascularity is normal. Lung volumes are normal. No airspace disease or interstitial abnormality is seen. The lungs are clear. Negative for pleural effusion or pneumothorax. The bones and visualized upper abdomen are unremarkable.  IMPRESSION: No acute cardiopulmonary disease.   Electronically Signed   By: Britta Mccreedy M.D.   On: 06/24/2013 08:35     EKG Interpretation None      MDM   Final diagnoses:  Abscess of buttock, left  Bronchitis    Discharge Medication List as of 06/24/2013 10:18 AM    START taking these medications   Details  oxyCODONE-acetaminophen (PERCOCET/ROXICET) 5-325 MG per tablet Take 1 tablet by mouth every 6 (six) hours as needed for severe pain., Starting 06/24/2013, Until Discontinued, Print    sulfamethoxazole-trimethoprim (SEPTRA DS) 800-160 MG per tablet Take 1 tablet by mouth every 12 (twelve) hours., Starting 06/24/2013, Until Discontinued, Print        Plan discharge  Devoria Albe, MD, Franz Dell, MD 06/24/13  [phone]

## 2013-06-24 NOTE — Discharge Instructions (Signed)
Soak in a tub of warm water for 30 minutes 3-4 times a day. You can pull out the packing in about 2 days while in the shower or tub or return to the ED to have it done. Take the percocet for pain and the antibiotic until gone (it is also good for lung infection).  Recheck if you get short of breath, fever, chest pain or seem worse.    Abscess An abscess is an infected area that contains a collection of pus and debris.It can occur in almost any part of the body. An abscess is also known as a furuncle or boil. CAUSES  An abscess occurs when tissue gets infected. This can occur from blockage of oil or sweat glands, infection of hair follicles, or a minor injury to the skin. As the body tries to fight the infection, pus collects in the area and creates pressure under the skin. This pressure causes pain. People with weakened immune systems have difficulty fighting infections and get certain abscesses more often.  SYMPTOMS Usually an abscess develops on the skin and becomes a painful mass that is red, warm, and tender. If the abscess forms under the skin, you may feel a moveable soft area under the skin. Some abscesses break open (rupture) on their own, but most will continue to get worse without care. The infection can spread deeper into the body and eventually into the bloodstream, causing you to feel ill.  DIAGNOSIS  Your caregiver will take your medical history and perform a physical exam. A sample of fluid may also be taken from the abscess to determine what is causing your infection. TREATMENT  Your caregiver may prescribe antibiotic medicines to fight the infection. However, taking antibiotics alone usually does not cure an abscess. Your caregiver may need to make a small cut (incision) in the abscess to drain the pus. In some cases, gauze is packed into the abscess to reduce pain and to continue draining the area. HOME CARE INSTRUCTIONS   Only take over-the-counter or prescription medicines for  pain, discomfort, or fever as directed by your caregiver.  If you were prescribed antibiotics, take them as directed. Finish them even if you start to feel better.  If gauze is used, follow your caregiver's directions for changing the gauze.  To avoid spreading the infection:  Keep your draining abscess covered with a bandage.  Wash your hands well.  Do not share personal care items, towels, or whirlpools with others.  Avoid skin contact with others.  Keep your skin and clothes clean around the abscess.  Keep all follow-up appointments as directed by your caregiver. SEEK MEDICAL CARE IF:   You have increased pain, swelling, redness, fluid drainage, or bleeding.  You have muscle aches, chills, or a general ill feeling.  You have a fever. MAKE SURE YOU:   Understand these instructions.  Will watch your condition.  Will get help right away if you are not doing well or get worse. Document Released: 12/09/2004 Document Revised: 08/31/2011 Document Reviewed: 05/14/2011 [facility] Patient Information 2014 [place], [place].

## 2013-06-24 NOTE — ED Provider Notes (Signed)
  Phillip CharsJustin A Din is a 28 y.o. male presents with Hx of HIV and recurrent boils with abscess noted to the right buttock.  Pt was evaluated and treated by Dr. Devoria AlbeIva Knapp.  I performed the I&D of the site as indicated below.    INCISION AND DRAINAGE Date/Time: 06/24/2013 10:00 AM Performed by: Dierdre ForthMUTHERSBAUGH, Tommie Bohlken Authorized by: Ward GivensKNAPP, IVA L Consent: Verbal consent obtained. Risks and benefits: risks, benefits and alternatives were discussed Consent given by: patient Patient understanding: patient states understanding of the procedure being performed Patient consent: the patient's understanding of the procedure matches consent given Procedure consent: procedure consent matches procedure scheduled Relevant documents: relevant documents present and verified Test results: test results available and properly labeled Site marked: the operative site was marked Required items: required blood products, implants, devices, and special equipment available Patient identity confirmed: verbally with patient and arm band Time out: Immediately prior to procedure a "time out" was called to verify the correct patient, procedure, equipment, support staff and site/side marked as required. Type: abscess Body area: anogenital (right buttock) Anesthesia: local infiltration Local anesthetic: lidocaine 2% without epinephrine Anesthetic total: 8 ml Patient sedated: no Risk factor: underlying major vessel Scalpel size: 11 Incision type: single straight Complexity: complex Drainage: purulent Drainage amount: copious Packing material: 1/4 in gauze Patient tolerance: Patient tolerated the procedure well with no immediate complications.    Dahlia ClientHannah Arlander Gillen, PA-C 06/24/13 1002

## 2013-06-24 NOTE — ED Provider Notes (Signed)
Pt seen by me, had I and D by PA  Ward GivensIva L Ebony Rickel, MD 06/24/13 1014

## 2013-06-24 NOTE — ED Notes (Signed)
Patient transported to X-ray 

## 2013-06-24 NOTE — ED Notes (Signed)
Pt c/o abscess to left side of rectum.

## 2013-06-25 ENCOUNTER — Telehealth: Payer: Self-pay | Admitting: *Deleted

## 2013-06-25 NOTE — Telephone Encounter (Signed)
I spoke with Phillip Morales.  He needs an emergency supply of his medications.  I got this taken care of.  He is coming to pick up the voucher.

## 2013-07-02 ENCOUNTER — Encounter (HOSPITAL_COMMUNITY): Payer: Self-pay | Admitting: Emergency Medicine

## 2013-07-02 ENCOUNTER — Emergency Department (HOSPITAL_COMMUNITY)
Admission: EM | Admit: 2013-07-02 | Discharge: 2013-07-02 | Disposition: A | Discharge status: Home / Self Care | Payer: Self-pay | Attending: Emergency Medicine | Admitting: Emergency Medicine

## 2013-07-02 DIAGNOSIS — H1131 Conjunctival hemorrhage, right eye: Secondary | ICD-10-CM

## 2013-07-02 DIAGNOSIS — Z21 Asymptomatic human immunodeficiency virus [HIV] infection status: Secondary | ICD-10-CM | POA: Insufficient documentation

## 2013-07-02 DIAGNOSIS — I10 Essential (primary) hypertension: Secondary | ICD-10-CM | POA: Insufficient documentation

## 2013-07-02 DIAGNOSIS — R110 Nausea: Secondary | ICD-10-CM | POA: Insufficient documentation

## 2013-07-02 DIAGNOSIS — H209 Unspecified iridocyclitis: Secondary | ICD-10-CM | POA: Insufficient documentation

## 2013-07-02 DIAGNOSIS — F17200 Nicotine dependence, unspecified, uncomplicated: Secondary | ICD-10-CM | POA: Insufficient documentation

## 2013-07-02 DIAGNOSIS — H1130 Conjunctival hemorrhage, unspecified eye: Secondary | ICD-10-CM | POA: Insufficient documentation

## 2013-07-02 DIAGNOSIS — Z79899 Other long term (current) drug therapy: Secondary | ICD-10-CM | POA: Insufficient documentation

## 2013-07-02 DIAGNOSIS — J45909 Unspecified asthma, uncomplicated: Secondary | ICD-10-CM | POA: Insufficient documentation

## 2013-07-02 MED ORDER — TETRACAINE HCL 0.5 % OP SOLN
1.0000 [drp] | Freq: Once | OPHTHALMIC | Status: AC
  Administered 2013-07-02: 1 [drp] via OPHTHALMIC

## 2013-07-02 MED ORDER — CYCLOPENTOLATE-PHENYLEPHRINE OP SOLN OPTIME - NO CHARGE
OPHTHALMIC | Status: AC
  Filled 2013-07-02: qty 2

## 2013-07-02 MED ORDER — CYCLOPENTOLATE-PHENYLEPHRINE 0.2-1 % OP SOLN
1.0000 [drp] | Freq: Once | OPHTHALMIC | Status: AC
  Administered 2013-07-02: 1 [drp] via OPHTHALMIC
  Filled 2013-07-02: qty 2

## 2013-07-02 MED ORDER — ONDANSETRON 8 MG PO TBDP
ORAL_TABLET | ORAL | Status: AC
  Filled 2013-07-02: qty 1

## 2013-07-02 MED ORDER — TETRACAINE HCL 0.5 % OP SOLN
OPHTHALMIC | Status: AC
  Administered 2013-07-02: 1 [drp] via OPHTHALMIC
  Filled 2013-07-02: qty 2

## 2013-07-02 MED ORDER — HOMATROPINE HBR 5 % OP SOLN: 1.0000 [drp] | Freq: Once | OPHTHALMIC | Status: DC

## 2013-07-02 MED ORDER — ONDANSETRON 8 MG PO TBDP
8.0000 mg | ORAL_TABLET | Freq: Once | ORAL | Status: AC
  Administered 2013-07-02: 8 mg via ORAL

## 2013-07-02 NOTE — ED Notes (Signed)
Complain of pain in right eye.

## 2013-07-02 NOTE — Discharge Instructions (Signed)
Subconjunctival Hemorrhage Your exam shows you have a subconjunctival hemorrhage. This is a harmless collection of blood covering a portion of the white of the eye. This condition may be due to injury or to straining (lifting, sneezing, or coughing). Often, there is no known cause. Subconjunctival blood does not cause pain or vision problems. This condition needs no treatment. It will take 1 to 2 weeks for the blood to dissolve. If you take aspirin or Coumadin on a daily basis or if you have high blood pressure, you should check with your doctor about the need for further treatment. Please call your doctor if you have problems with your vision, pain around the eye, or any other concerns about your condition. Document Released: 04/08/2004 Document Revised: 05/24/2011 Document Reviewed: 01/27/2009 [facility] Patient Information 2014 [place], [place].  Iritis Iritis is when the colored part of the eye (iris) is red and painful (inflamed). Light might seem to hurt your eyes (light sensitivity). You may have blurred vision or start to tear up. It is important to treat this problem early.  HOME CARE  Use eyedrops or pills  as told by your doctor.  Only take medicine as told by your doctor. GET HELP RIGHT AWAY IF:   You have redness in one or both eyes.  Light seems to hurt your eyes.  You have pain in one or both eyes. MAKE SURE YOU:   Understand these instructions.  Will watch your condition.  Will get help right away if you are not doing well or get worse. Document Released: 05/26/2009 Document Revised: 05/24/2011 Document Reviewed: 05/26/2009 [facility] Patient Information 2014 [place], [place].   You may apply the cyclomydril medication to your right eye 1-2 drops every 12 hours - this will dilate your pupil and give you pain relief,  But will cause you to be more light sensitive.  You may consider wearing sunglasses to avoid this discomfort.  Call Dr. Lita MainsHaines for a recheck of your eye in 1-2  days.
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
## 2013-07-02 NOTE — ED Notes (Signed)
Pt sates he has to wear glasses to see. Visual acuity 20/25 both eyes with glasses. States he cannot see the eye chart without his glasses

## 2013-07-05 NOTE — ED Provider Notes (Signed)
Medical screening examination/treatment/procedure(s) were performed by non-physician practitioner and as supervising physician I was immediately available for consultation/collaboration.   EKG Interpretation None        Duanna Runk L Cipriano Millikan, MD 07/05/13 1934 

## 2013-07-10 ENCOUNTER — Ambulatory Visit (INDEPENDENT_AMBULATORY_CARE_PROVIDER_SITE_OTHER): Payer: Self-pay | Admitting: Internal Medicine

## 2013-07-10 ENCOUNTER — Encounter: Payer: Self-pay | Admitting: Internal Medicine

## 2013-07-10 VITALS — BP 141/94 | HR 105 | Temp 99.1°F | Ht 67.0 in | Wt 177.0 lb

## 2013-07-10 DIAGNOSIS — B20 Human immunodeficiency virus [HIV] disease: Secondary | ICD-10-CM

## 2013-07-10 DIAGNOSIS — L03317 Cellulitis of buttock: Secondary | ICD-10-CM

## 2013-07-10 DIAGNOSIS — L0231 Cutaneous abscess of buttock: Secondary | ICD-10-CM

## 2013-07-10 MED ORDER — SULFAMETHOXAZOLE-TRIMETHOPRIM 800-160 MG PO TABS: 1.0000 | ORAL_TABLET | Freq: Two times a day (BID) | ORAL | Status: DC

## 2013-07-10 NOTE — Assessment & Plan Note (Signed)
Still draining.  I offered to open it up more to help drain but patient refused.  Gave him Bactrim on 4$ list (adap not yet active).  He will try UC if he needs it drained again.

## 2013-07-10 NOTE — Progress Notes (Signed)
   Subjective:    Patient ID: Phillip Morales, male    DOB: 05/06/1985, 28 y.o.   MRN: [id number]  HPI  Here for a work in visit. He previously was in jail and was on therapy with that the with on undetectable viral load. I then saw him at the end of 2014 and he was on Prezista, Norvir and Truvada. He though had no coverage for most of December and tells me he was out of his medicines until early January though according to the pharmacy, he did not receive his medicines until at least mid January. He though denies any missed doses since he's been on them. He last had lab in February with decreased viral load and CD4 over 300.  He comes in today for a work in visit for buttock abscess.  He had it drained in ED but continues to drain pus.  Had an antibiotic but his step mother threw everything away.  ADAP was renewed late but he got an emergency supply of meds.     Review of Systems  Constitutional: Negative for fever, chills and fatigue.  HENT: Negative for trouble swallowing.   Gastrointestinal: Negative for nausea and diarrhea.  Skin: Negative for rash.  Neurological: Negative for dizziness and light-headedness.  Hematological: Negative for adenopathy.       Objective:   Physical Exam  Constitutional: He appears well-developed and well-nourished. No distress.  HENT:  Mouth/Throat: No oropharyngeal exudate.  Eyes: No scleral icterus.  Cardiovascular: Normal rate, regular rhythm and normal heart sounds.   No murmur heard. Pulmonary/Chest: Effort normal and breath sounds normal. No respiratory distress. He has no wheezes.  Genitourinary:  Right buttock with draining area of pus, indurated, firm  Lymphadenopathy:    He has no cervical adenopathy.  Skin: No rash noted.          Assessment & Plan:

## 2013-07-10 NOTE — Assessment & Plan Note (Signed)
Return in June for labs and follow up

## 2013-08-13 ENCOUNTER — Other Ambulatory Visit: Payer: Self-pay

## 2013-08-15 ENCOUNTER — Other Ambulatory Visit (INDEPENDENT_AMBULATORY_CARE_PROVIDER_SITE_OTHER): Payer: Self-pay

## 2013-08-15 ENCOUNTER — Other Ambulatory Visit: Payer: Self-pay | Admitting: Internal Medicine

## 2013-08-15 DIAGNOSIS — B20 Human immunodeficiency virus [HIV] disease: Secondary | ICD-10-CM

## 2013-08-15 LAB — CBC WITH DIFFERENTIAL/PLATELET
BASOS ABS: 0 10*3/uL (ref 0.0–0.1)
BASOS PCT: 1 % (ref 0–1)
Eosinophils Absolute: 0.2 10*3/uL (ref 0.0–0.7)
Eosinophils Relative: 5 % (ref 0–5)
HCT: 36.1 % — ABNORMAL LOW (ref 39.0–52.0)
Hemoglobin: 12.6 g/dL — ABNORMAL LOW (ref 13.0–17.0)
Lymphocytes Relative: 49 % — ABNORMAL HIGH (ref 12–46)
Lymphs Abs: 2.3 10*3/uL (ref 0.7–4.0)
MCH: 29.2 pg (ref 26.0–34.0)
MCHC: 34.9 g/dL (ref 30.0–36.0)
MCV: 83.6 fL (ref 78.0–100.0)
Monocytes Absolute: 0.8 10*3/uL (ref 0.1–1.0)
Monocytes Relative: 16 % — ABNORMAL HIGH (ref 3–12)
NEUTROS ABS: 1.4 10*3/uL — AB (ref 1.7–7.7)
NEUTROS PCT: 29 % — AB (ref 43–77)
Platelets: 239 10*3/uL (ref 150–400)
RBC: 4.32 MIL/uL (ref 4.22–5.81)
RDW: 14.8 % (ref 11.5–15.5)
WBC: 4.7 10*3/uL (ref 4.0–10.5)

## 2013-08-15 LAB — COMPLETE METABOLIC PANEL WITH GFR
ALBUMIN: 3.9 g/dL (ref 3.5–5.2)
ALT: 16 U/L (ref 0–53)
AST: 20 U/L (ref 0–37)
Alkaline Phosphatase: 75 U/L (ref 39–117)
BILIRUBIN TOTAL: 0.3 mg/dL (ref 0.2–1.2)
BUN: 5 mg/dL — ABNORMAL LOW (ref 6–23)
CO2: 28 meq/L (ref 19–32)
Calcium: 9.3 mg/dL (ref 8.4–10.5)
Chloride: 102 mEq/L (ref 96–112)
Creat: 1.02 mg/dL (ref 0.50–1.35)
GLUCOSE: 91 mg/dL (ref 70–99)
POTASSIUM: 4.1 meq/L (ref 3.5–5.3)
SODIUM: 137 meq/L (ref 135–145)
Total Protein: 8.7 g/dL — ABNORMAL HIGH (ref 6.0–8.3)

## 2013-08-16 LAB — T-HELPER CELL (CD4) - (RCID CLINIC ONLY)
CD4 % Helper T Cell: 19 % — ABNORMAL LOW (ref 33–55)
CD4 T CELL ABS: 430 /uL (ref 400–2700)

## 2013-08-16 LAB — HIV-1 RNA QUANT-NO REFLEX-BLD
HIV 1 RNA Quant: <20 copies/mL (ref <20)
HIV-1 RNA Quant, Log: <1.3 {Log} (ref <1.30)

## 2013-08-18 ENCOUNTER — Other Ambulatory Visit: Payer: Self-pay | Admitting: Internal Medicine

## 2013-08-21 ENCOUNTER — Ambulatory Visit: Payer: Self-pay | Admitting: Internal Medicine

## 2013-08-23 ENCOUNTER — Encounter: Payer: Self-pay | Admitting: Internal Medicine

## 2013-08-23 ENCOUNTER — Ambulatory Visit (INDEPENDENT_AMBULATORY_CARE_PROVIDER_SITE_OTHER): Payer: Self-pay | Admitting: Internal Medicine

## 2013-08-23 VITALS — BP 161/101 | HR 76 | Temp 98.6°F | Ht 67.0 in | Wt 187.0 lb

## 2013-08-23 DIAGNOSIS — I10 Essential (primary) hypertension: Secondary | ICD-10-CM

## 2013-08-23 DIAGNOSIS — L0231 Cutaneous abscess of buttock: Secondary | ICD-10-CM

## 2013-08-23 DIAGNOSIS — L03317 Cellulitis of buttock: Secondary | ICD-10-CM

## 2013-08-23 DIAGNOSIS — Z23 Encounter for immunization: Secondary | ICD-10-CM

## 2013-08-23 DIAGNOSIS — B20 Human immunodeficiency virus [HIV] disease: Secondary | ICD-10-CM

## 2013-08-23 NOTE — Assessment & Plan Note (Signed)
Had run out of meds so will start again today.

## 2013-08-23 NOTE — Assessment & Plan Note (Signed)
Doing much better now and completely suppressed.  RTC 3 months.

## 2013-08-23 NOTE — Progress Notes (Signed)
   Subjective:    Patient ID: Phillip Morales, male    DOB: Sep 03, 1985, 28 y.o.   MRN: [id number]  HPI  Her for followup of his HIV. He previously was in jail and was on therapy with that the with on undetectable viral load. I then saw him at the end of 2014 and he was on Prezista, Norvir and Truvada. He was out of meds mid January. He though denies any missed doses since he's been on them. His CD4 is coming up and now over 400 and viral load now completely undetectable.  Takes meds daily with no missed doses.  Also with some draining of his abscess still.  Does not want it I and D'd at this time.  Has Bactrim to take.     Review of Systems  Constitutional: Negative for fever, chills and fatigue.  Gastrointestinal: Negative for nausea and diarrhea.  Skin: Negative for rash.  Neurological: Negative for dizziness and light-headedness.  Hematological: Negative for adenopathy.       Objective:   Physical Exam  Constitutional: He appears well-developed and well-nourished. No distress.  HENT:  Mouth/Throat: No oropharyngeal exudate.  Eyes: No scleral icterus.  Cardiovascular: Normal rate, regular rhythm and normal heart sounds.   No murmur heard. Pulmonary/Chest: Effort normal and breath sounds normal. No respiratory distress. He has no wheezes.  Lymphadenopathy:    He has no cervical adenopathy.  Skin: No rash noted.          Assessment & Plan:

## 2013-08-23 NOTE — Assessment & Plan Note (Signed)
redraining again.  I offered to I and D but he doesn't want it done now.  He will go to urgent care if needed.  Taking Bactrim.

## 2013-09-12 ENCOUNTER — Other Ambulatory Visit: Payer: Self-pay | Admitting: *Deleted

## 2013-09-12 ENCOUNTER — Ambulatory Visit: Payer: Self-pay

## 2013-09-12 DIAGNOSIS — B20 Human immunodeficiency virus [HIV] disease: Secondary | ICD-10-CM

## 2013-09-12 MED ORDER — DARUNAVIR ETHANOLATE 800 MG PO TABS: 800.0000 mg | ORAL_TABLET | Freq: Every day | ORAL | Status: DC

## 2013-09-12 MED ORDER — RITONAVIR 100 MG PO TABS: 100.0000 mg | ORAL_TABLET | Freq: Every day | ORAL | Status: DC

## 2013-09-12 MED ORDER — EMTRICITABINE-TENOFOVIR DF 200-300 MG PO TABS: 1.0000 | ORAL_TABLET | Freq: Every day | ORAL | Status: DC

## 2013-09-13 ENCOUNTER — Emergency Department (HOSPITAL_COMMUNITY)
Admission: EM | Admit: 2013-09-13 | Discharge: 2013-09-13 | Disposition: A | Discharge status: Home / Self Care | Payer: Self-pay | Attending: Emergency Medicine | Admitting: Emergency Medicine

## 2013-09-13 ENCOUNTER — Encounter (HOSPITAL_COMMUNITY): Payer: Self-pay | Admitting: Emergency Medicine

## 2013-09-13 DIAGNOSIS — J45909 Unspecified asthma, uncomplicated: Secondary | ICD-10-CM | POA: Insufficient documentation

## 2013-09-13 DIAGNOSIS — Z79899 Other long term (current) drug therapy: Secondary | ICD-10-CM | POA: Insufficient documentation

## 2013-09-13 DIAGNOSIS — Z792 Long term (current) use of antibiotics: Secondary | ICD-10-CM | POA: Insufficient documentation

## 2013-09-13 DIAGNOSIS — F17200 Nicotine dependence, unspecified, uncomplicated: Secondary | ICD-10-CM | POA: Insufficient documentation

## 2013-09-13 DIAGNOSIS — Z21 Asymptomatic human immunodeficiency virus [HIV] infection status: Secondary | ICD-10-CM | POA: Insufficient documentation

## 2013-09-13 DIAGNOSIS — L0231 Cutaneous abscess of buttock: Secondary | ICD-10-CM | POA: Insufficient documentation

## 2013-09-13 DIAGNOSIS — L03317 Cellulitis of buttock: Principal | ICD-10-CM

## 2013-09-13 DIAGNOSIS — I10 Essential (primary) hypertension: Secondary | ICD-10-CM | POA: Insufficient documentation

## 2013-09-13 MED ORDER — OXYCODONE-ACETAMINOPHEN 5-325 MG PO TABS: 1.0000 | ORAL_TABLET | Freq: Four times a day (QID) | ORAL | Status: DC | PRN

## 2013-09-13 MED ORDER — DOXYCYCLINE HYCLATE 100 MG PO CAPS: 100.0000 mg | ORAL_CAPSULE | Freq: Two times a day (BID) | ORAL | Status: DC

## 2013-09-13 MED ORDER — OXYCODONE-ACETAMINOPHEN 5-325 MG PO TABS
1.0000 | ORAL_TABLET | Freq: Once | ORAL | Status: AC
  Administered 2013-09-13: 1 via ORAL
  Filled 2013-09-13: qty 1

## 2013-09-13 NOTE — ED Provider Notes (Signed)
CSN: [id number]     Arrival date & time 09/13/13  1249 History  This chart was scribed for non-physician practitioner Charlestine Nighthristopher Shauniece Kwan working with No att. providers found by Carl Bestelina Holson, ED Scribe. This patient was seen in room TR11C/TR11C and the patient's care was started at 1:03 PM.     Chief Complaint  Patient presents with  . Abscess   Patient is a 28 y.o. male presenting with abscess. The history is provided by the patient. No language interpreter was used.  Abscess  HPI Comments: Noralee CharsJustin A Asante is a 28 y.o. male who presents to the Emergency Department complaining of a progressively worsening abscess on his left buttocks that started a month ago.  He states that [facility] and feels as though the person lancing it hit a nerve.  He states that he is currently unable to stand for long periods of time and will experience a shooting rectal pain when having a bowel movement.  The patient states that he has a history of abscesses.     Past Medical History  Diagnosis Date  . Asthma   . HIV (human immunodeficiency virus infection)   . Hypertension    History reviewed. No pertinent past surgical history. Family History  Problem Relation Age of Onset  . Kidney disease Neg Hx    History  Substance Use Topics  . Smoking status: Current Some Day Smoker -- 0.25 packs/day    Types: Cigarettes    Start date: 03/03/2013  . Smokeless tobacco: Never Used     Comment: no longer smokes  . Alcohol Use: No    Review of Systems  Skin: Positive for wound.      Allergies  Review of patient's allergies indicates no known allergies.  Home Medications   Prior to Admission medications   Medication Sig Start Date End Date Taking? Authorizing Provider  amLODipine (NORVASC) 5 MG tablet Take 5 mg by mouth daily.    Historical Provider, MD  Darunavir Ethanolate (PREZISTA) 800 MG tablet Take 1 tablet (800 mg total) by mouth daily. 09/12/13   Judyann Munsonynthia Snider,  MD  emtricitabine-tenofovir (TRUVADA) 200-300 MG per tablet Take 1 tablet by mouth daily. 09/12/13   Judyann Munsonynthia Snider, MD  ibuprofen (ADVIL,MOTRIN) 200 MG tablet Take 400-600 mg by mouth every 8 (eight) hours as needed for moderate pain.    Historical Provider, MD  ritonavir (NORVIR) 100 MG TABS tablet Take 1 tablet (100 mg total) by mouth daily. 09/12/13   Judyann Munsonynthia Snider, MD  sulfamethoxazole-trimethoprim (BACTRIM DS,SEPTRA DS) 800-160 MG per tablet Take 1 tablet by mouth 2 (two) times daily. 07/10/13   Gardiner Barefootobert W Comer, MD   Triage Vitals: BP 156/96  Pulse 97  Temp(Src) 98.3 F (36.8 C) (Oral)  Resp 18  SpO2 100%  Physical Exam  Nursing note and vitals reviewed. Constitutional: He is oriented to person, place, and time. He appears well-developed and well-nourished. No distress.  HENT:  Head: Normocephalic and atraumatic.  Cardiovascular: Normal rate.   Pulmonary/Chest: Effort normal.  Neurological: He is alert and oriented to person, place, and time.  Skin: Skin is warm and dry. He is not diaphoretic.  Abscess to the left medial buttocks draining blood.  Psychiatric: He has a normal mood and affect. His behavior is normal.    ED Course  Procedures (including critical care time)  DIAGNOSTIC STUDIES: Oxygen Saturation is 100% on room air, normal by my interpretation.    COORDINATION OF CARE:  1:05 PM- Discussed lancing the patient's abscess and administering pain medication in the ED.  The patient agreed to the treatment plan.  I personally performed the services described in this documentation, which was scribed in my presence. The recorded information has been reviewed and is accurate.  INCISION AND DRAINAGE Performed by: Carlyle DollyLAWYER,Pascal Stiggers W Consent: Verbal consent obtained. Risks and benefits: risks, benefits and alternatives were discussed Type: abscess  Body area:L medial buttocks  Anesthesia: local infiltration  Incision was made with a scalpel.  Local anesthetic:  lidocaine 2% wo epinephrine  Anesthetic total: 7 ml  Complexity: complex Blunt dissection to break up loculations  Drainage: purulent  Drainage amount: large  Packing material: 1/4 in iodoform gauze  Patient tolerance: Patient tolerated the procedure well with no immediate complications.   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    Carlyle Dollyhristopher W Elli Groesbeck, PA-C 09/19/13 [phone]

## 2013-09-13 NOTE — Discharge Instructions (Signed)
Return here as needed. Follow up with the surgeon provided. Use heat around the area.  °

## 2013-09-13 NOTE — ED Notes (Signed)
Pt reports abscess to left buttocks x 1 month progressively worsening. Denies fever/chills. Hx of frequent abscesses.

## 2013-09-23 NOTE — ED Provider Notes (Signed)
Medical screening examination/treatment/procedure(s) were performed by non-physician practitioner and as supervising physician I was immediately available for consultation/collaboration.   EKG Interpretation None       Delshawn Stech L Brittnay Pigman, MD 09/23/13 1423 

## 2013-10-14 ENCOUNTER — Other Ambulatory Visit: Payer: Self-pay | Admitting: Internal Medicine

## 2013-10-14 DIAGNOSIS — B20 Human immunodeficiency virus [HIV] disease: Secondary | ICD-10-CM

## 2013-11-12 ENCOUNTER — Other Ambulatory Visit: Payer: Self-pay

## 2013-11-14 ENCOUNTER — Other Ambulatory Visit (INDEPENDENT_AMBULATORY_CARE_PROVIDER_SITE_OTHER): Payer: Self-pay

## 2013-11-14 DIAGNOSIS — B20 Human immunodeficiency virus [HIV] disease: Secondary | ICD-10-CM

## 2013-11-14 DIAGNOSIS — Z113 Encounter for screening for infections with a predominantly sexual mode of transmission: Secondary | ICD-10-CM

## 2013-11-15 ENCOUNTER — Telehealth: Payer: Self-pay | Admitting: Licensed Clinical Social Worker

## 2013-11-15 LAB — T-HELPER CELL (CD4) - (RCID CLINIC ONLY)
CD4 T CELL ABS: 410 /uL (ref 400–2700)
CD4 T CELL HELPER: 21 % — AB (ref 33–55)

## 2013-11-15 LAB — FLUORESCENT TREPONEMAL AB(FTA)-IGG-BLD: Fluorescent Treponemal ABS: NONREACTIVE

## 2013-11-15 LAB — RPR TITER: RPR Titer: 1:16 {titer} — AB

## 2013-11-15 LAB — RPR: RPR Ser Ql: REACTIVE — AB

## 2013-11-15 NOTE — Telephone Encounter (Signed)
Message copied by Harvie Bridge on Thu Nov 15, 2013 11:14 AM ------      Message from: Gardiner Barefoot      Created: Thu Nov 15, 2013 10:07 AM       Has syphilis again.  Needs 2.4 million units Bicillin weekly x 3. thanks ------

## 2013-11-15 NOTE — Telephone Encounter (Signed)
I called patient and left a message for him to call our office regarding lab results.

## 2013-11-16 LAB — HIV-1 RNA QUANT-NO REFLEX-BLD: HIV-1 RNA Quant, Log: <1.3 {Log} (ref <1.30)

## 2013-11-26 ENCOUNTER — Ambulatory Visit (INDEPENDENT_AMBULATORY_CARE_PROVIDER_SITE_OTHER): Payer: Self-pay | Admitting: Internal Medicine

## 2013-11-26 ENCOUNTER — Encounter: Payer: Self-pay | Admitting: Internal Medicine

## 2013-11-26 ENCOUNTER — Ambulatory Visit: Payer: Self-pay

## 2013-11-26 VITALS — BP 144/79 | HR 81 | Temp 98.9°F | Wt 188.0 lb

## 2013-11-26 DIAGNOSIS — Z23 Encounter for immunization: Secondary | ICD-10-CM

## 2013-11-26 DIAGNOSIS — Z8614 Personal history of Methicillin resistant Staphylococcus aureus infection: Secondary | ICD-10-CM

## 2013-11-26 DIAGNOSIS — Z8619 Personal history of other infectious and parasitic diseases: Secondary | ICD-10-CM

## 2013-11-26 DIAGNOSIS — B20 Human immunodeficiency virus [HIV] disease: Secondary | ICD-10-CM

## 2013-11-26 MED ORDER — MUPIROCIN 2 % EX OINT: 1.0000 "application " | TOPICAL_OINTMENT | Freq: Two times a day (BID) | CUTANEOUS | Status: DC

## 2013-11-26 MED ORDER — CHLORHEXIDINE GLUCONATE 4 % EX LIQD: Freq: Every day | CUTANEOUS | Status: DC

## 2013-11-26 NOTE — Assessment & Plan Note (Signed)
Doing great.  RTC 4 months.   

## 2013-11-26 NOTE — Assessment & Plan Note (Signed)
Will follow titer

## 2013-11-26 NOTE — Assessment & Plan Note (Signed)
Will try decolonization protocol.  Instructions given

## 2013-11-26 NOTE — Patient Instructions (Signed)
[facility] Five day Treatment Plan for MRSA (Staph) Decolonization  Please let us know if you have questions or concerns or do not understand the information we give you.  Prepare Chose a period when you will be uninterrupted by going away or other distractions. To ensure that your skin is in good condition, follow the Routine Skin Care principles below to reduce drying and enhance healing. Do not start while you have any active boils. Routine Skin Care principles to reduce drying and enhance healing:  Avoid the use of soap when bathing or showering. DO NOT routinely use antiseptic solutions. If you need to use something, chose a soap substitute (examples -QV Wash or Cetaphil).   When drying with a towel, be gentle and pat dry your skin. Avoid rubbing the skin.   Reduce the overall frequency of bathing or showering. A short shower (3 minutes) is better than a bath in terms of its effect on the skin.   Use a simple sorbelene based-cream on your skin prior to showering and immediately after drying. (Examples: Hydroderm or other Sorbelene-based preparations). Especially protect healing or dry areas of skin in this way. Don't use a barrier cream with a vaseline base or with perfumes and additives. You are more likely to be allergic to these products, and cause further damage to your skin.   Make sure that you clean and cover any skin cuts or grazes that occur. Try to avoid picking or biting fingernails and the skin around the nails Keep your fingernails clipped short and clean to reduce problems caused by scratching.   For itchy skin, try gently massaging sorbelene-based cream into itchy areas instead of scratching it. A long-acting non-sedating antihistamine drug is the next option to try. However make sure that you are not taking medication that will interact with this drug type.                                                                         To prepare yourself  for your treatment, it is recommended that you complete the following steps:  Remove nose, ear and other body piercing items for several days prior to the treatment and keep them out during the treatment period   Purchase a new toothbrush, disposable razor (if used), sterident for dentures (if required) and a container of alcohol hand hygiene solution (gel or rub)   Discard old toothbrushes and razors when the treatment starts. Also discard opened deodorant rollers, skin adhesive tapes, skin creams and solutions- all of these may already be contaminated with staph   Discard pumice stone(s), sponges and disposable face cloths if used    Discard all make-up brushes, creams, and implements   Discard or hot wash all fluffy toys   Wash hair brush and comb, nail files, plastic toys and cutters in the dishwasher or purchase new ones   Remove nail varnish and artificial nails  Daily routine for 5 days     *Minimize contact with members of the community during the 5 days of treatment* Body washes The effectiveness of the program increases if the correct procedure is used:  Apply the provided antiseptic body wash (2% chlorhexidine) in the shower daily   Take   care to wash hair, under the arms and into the groin and into any folds of skin   Allow the antiseptic to remain on the skin for at least 5 minutes  Nasal ointment  Disinfect your hands with alcohol gel/rub and allow to dry    Open the mupirocin 2% (Bactroban) nasal ointment.   Place small amount (size of match head) of ointment onto a clean cotton swab and massage gently around the inside of the nostril on one side, making sure not to insert it too deeply (no more than 2 cm or a little less than an inch).   Use a new cotton bud for the other nostril so that you do not contaminate the Bactroban tube with staph.    After applying the ointment, press a finger against the nose next to the nostril opening and use a circular motion to  spread the ointment within the nose   Apply the mupirocin ointment two times a day for 5 days   Disinfect your hands with alcohol rub/gel after applying the ointmentp>  Personal items (combs, razor, eyeglasses, jewelry, etc.)     Disinfect all personal items daily with an alcohol-based cleanser  House Environment and Clothes/Linens  On day 2 and 5 of the treatment, clean your house, (especially the bedroom and bathroom). Clean dust off all surfaces and then vacuum clean all floor surfaces AND soft furnishings (such as your favorite chair). If your chair/couch has a vinyl or leather covering then wipe over the chair with warm soapy water and then dry with a clean towel (which should then be washed). Staph lives in skin scales from humans that contaminate the environment. This can lead to re-infection.    Disinfect the shower floor and/or bath tub daily    On days 1, 3, AND 5 of the treatment wash your clothes, underwear, pajamas and bed linen (such as towels, sheets, washcloths, and bath mats). A hot wash with laundry detergent is best (there is no need to use expensive laundry detergent or powder). Dry clothes in sun if possible. Change into clean clothes or pajamas on those days after your shower.    Do not share or exchange any personal items of clothing  Sports/Gym     Surfaces, equipment and towels, and skin-to-skin contact are all potential sources for staph re-infection Pets Dogs and other companion animals can also be colonized with the same strains of MRSA. Best to wash or replace bedding material for the animal and wash the animal at least once during the treatment period with antiseptic solution (2% chlorhexidine wash). Ensure that the skin of the animal is kept in good condition. If the pet has any chronic skin disorders, consult your vet prior to starting your treatment process. What about my partner, family, or household members? Usually when an aggressive strain of staph moves in  to a family or household, only certain members of that group get infections (boils). This is despite the fact that the strain has probably transferred itself from person to person within the group. Those without boils may also be carrying the bacterium, however they must have better resistance (immunity) or perhaps have better skin condition. Staph likes to invade through cuts, scratches and skin with dermatitis or dryness.    Follow-up after decolonization treatment  Possible approaches will vary depending on how your treatment goes. Your provider will instruct you on the follow-up best suited for you. Some options are:   Wait and see - if no further   boils occur within 6 months then it is probable that the strain of staph has been eliminated from you.    Continue intermittent body washes 1-2 times per week with 2% aqueous chlorhexidine soap preparation or similar   Future antibiotic use  The best preventative approach to avoiding future problems may be to avoid use of antibiotics unless there is a strong indication. Antibiotics are often prescribed for minor infections or for respiratory infections that are mostly due to viruses. It is in your best interest to ride these infections out rather than taking antibiotics. Taking antibiotics alters your natural bacterial flora on your skin and in your gut. This may reduce your resistance against acquiring a resistant bacterial strain such as MRSA).   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   References Wiese-Posselt et al. Clin Inf Dis 2007:44:e88  CDC:  http://www.cdc.gov/ncidod/dhqp/ar_mrsa_ca.html 

## 2013-11-26 NOTE — Progress Notes (Signed)
   Subjective:    Patient ID: Phillip Morales, male    DOB: [birth year], 28 y.o.   MRN: [id number]  HPI Her for followup of his HIV. He previously was in jail and was on therapy with that the with on undetectable viral load. I then saw him at the end of 2014 and he was on Prezista, Norvir and Truvada. He was out of meds mid January but back on them and denies missed doses.  Viral load remains undetectable and CD4 410.  Still with issues of draining gluteal abscess.     Review of Systems  Constitutional: Negative for fever, chills and fatigue.  Gastrointestinal: Negative for nausea and diarrhea.  Skin: Negative for rash.  Neurological: Negative for dizziness and light-headedness.  Hematological: Negative for adenopathy.       Objective:   Physical Exam  Constitutional: He appears well-developed and well-nourished. No distress.  HENT:  Mouth/Throat: No oropharyngeal exudate.  Eyes: No scleral icterus.  Cardiovascular: Normal rate, regular rhythm and normal heart sounds.   No murmur heard. Pulmonary/Chest: Effort normal and breath sounds normal. No respiratory distress. He has no wheezes.  Lymphadenopathy:    He has no cervical adenopathy.  Skin: No rash noted.          Assessment & Plan:

## 2013-11-27 ENCOUNTER — Telehealth: Payer: Self-pay | Admitting: *Deleted

## 2013-11-27 NOTE — Telephone Encounter (Signed)
Notified [facility].

## 2013-11-27 NOTE — Telephone Encounter (Signed)
i am retesting it next time.

## 2013-11-27 NOTE — Telephone Encounter (Signed)
Gardenia Phlegm, [facility], called regarding patient's latest RPR titer when compared to the one from 02/2013.  Please advise on patient's treatment plan if/when any retesting is scheduled. Andree Coss, RN

## 2013-11-28 ENCOUNTER — Other Ambulatory Visit: Payer: Self-pay | Admitting: Internal Medicine

## 2013-12-18 ENCOUNTER — Ambulatory Visit (INDEPENDENT_AMBULATORY_CARE_PROVIDER_SITE_OTHER): Payer: Self-pay | Admitting: Licensed Clinical Social Worker

## 2013-12-18 ENCOUNTER — Telehealth: Payer: Self-pay

## 2013-12-18 DIAGNOSIS — A539 Syphilis, unspecified: Secondary | ICD-10-CM

## 2013-12-18 MED ORDER — PENICILLIN G BENZATHINE 1200000 UNIT/2ML IM SUSP
1.2000 10*6.[IU] | Freq: Once | INTRAMUSCULAR | Status: AC
  Administered 2013-12-18: 1.2 10*6.[IU] via INTRAMUSCULAR

## 2013-12-18 NOTE — Telephone Encounter (Signed)
12-13-2013 Phillip Morales from [facility] has sent patients labs related to recent syphilis contact.  They are requesting the patient be treated due to his titer increasing and recently listed as contact of positive for syphilis.  Copy of labs will be scanned into medical records.  Per Dr Luciana Axeomer ok for treatment.     Bicillin LA  2.4 once IM .    Verbal order , read back and verified.   Laurell Josephsammy K Geddy [place], RN   Contacted patient he will come now.   Phillip Gustinshby notified pt is coming today.

## 2013-12-18 NOTE — Progress Notes (Signed)
Patient tolerated bicillin injection, no problems.

## 2013-12-25 ENCOUNTER — Ambulatory Visit (INDEPENDENT_AMBULATORY_CARE_PROVIDER_SITE_OTHER): Payer: Self-pay | Admitting: Family Medicine

## 2013-12-25 ENCOUNTER — Encounter: Payer: Self-pay | Admitting: Family Medicine

## 2013-12-25 ENCOUNTER — Ambulatory Visit (INDEPENDENT_AMBULATORY_CARE_PROVIDER_SITE_OTHER): Payer: Self-pay | Admitting: Licensed Clinical Social Worker

## 2013-12-25 VITALS — BP 140/74 | HR 67 | Temp 98.2°F | Resp 16 | Ht 67.0 in | Wt 187.0 lb

## 2013-12-25 DIAGNOSIS — I10 Essential (primary) hypertension: Secondary | ICD-10-CM

## 2013-12-25 DIAGNOSIS — F17200 Nicotine dependence, unspecified, uncomplicated: Secondary | ICD-10-CM

## 2013-12-25 DIAGNOSIS — A539 Syphilis, unspecified: Secondary | ICD-10-CM

## 2013-12-25 DIAGNOSIS — L0231 Cutaneous abscess of buttock: Secondary | ICD-10-CM

## 2013-12-25 DIAGNOSIS — B20 Human immunodeficiency virus [HIV] disease: Secondary | ICD-10-CM

## 2013-12-25 DIAGNOSIS — Z8619 Personal history of other infectious and parasitic diseases: Secondary | ICD-10-CM

## 2013-12-25 MED ORDER — PENICILLIN G BENZATHINE 1200000 UNIT/2ML IM SUSP
1.2000 10*6.[IU] | Freq: Once | INTRAMUSCULAR | Status: AC
  Administered 2013-12-25: 1.2 10*6.[IU] via INTRAMUSCULAR

## 2013-12-25 NOTE — Progress Notes (Signed)
Subjective:    Patient ID: Phillip Morales, male    DOB: 03/31/1985, 28 y.o.   MRN: [id number]  HPI  Phillip Morales is a 28 year old male that presents to office to establish care.  Patient states that he has been managing HIV for 1 year. Patient states that he is followed by [facility] Luc[facility] at [facility] every 3 months. He maintains that he has been taking medications consistently.  Patient is also here for evaluation of hypertension.  Patient denies chest pain, chest pressure/discomfort, dyspnea, fatigue, near-syncope, palpitations, syncope and tachypnea.  He does not have a  history of target organ damage.   Phillip Morales is also complaining of recurrent gluteal abscess. He describes the abscess as painful and draining. Current pain intensity is 2/10. He has not attempted any OTC interventions to alleviate pain.  He reports that he recently completed a course of antibiotics and is apply topical antibiotic twice daily.   Past Medical History  Diagnosis Date  . Asthma   . HIV (human immunodeficiency virus infection)   . Hypertension    Review of Systems  Constitutional: Negative for fever and fatigue.  HENT: Negative.   Eyes: Negative.   Respiratory: Positive for cough.   Cardiovascular: Negative.   Gastrointestinal: Negative.   Genitourinary: Negative.   Musculoskeletal: Negative.  Negative for neck pain and neck stiffness.  Skin: Positive for wound (left buttocks).  Neurological: Negative.   Hematological: Negative.   Psychiatric/Behavioral: Negative.  Negative for suicidal ideas and sleep disturbance.       Objective:   Physical Exam  Constitutional: He is oriented to person, place, and time. He appears well-developed and well-nourished.  HENT:  Head: Normocephalic.  Right Ear: External ear normal.  Eyes: Conjunctivae are normal. Pupils are equal, round, and reactive to light.  Neck: Normal range of motion. Neck supple.  Cardiovascular: Normal rate, regular rhythm, normal heart sounds and  intact distal pulses.   Pulmonary/Chest: Effort normal and breath sounds normal.  Abdominal: Soft. Bowel sounds are normal.  Musculoskeletal: Normal range of motion.  Neurological: He is alert and oriented to person, place, and time.  Skin: Skin is warm and dry.     Left gluteal abscess  Psychiatric: He has a normal mood and affect. His behavior is normal. Judgment and thought content normal.         BP 140/74  Pulse 67  Temp(Src) 98.2 F (36.8 C) (Oral)  Resp 16  Ht 5\' 7"  (1.702 Morales)  Wt 187 lb (84.823 kg)  BMI 29.28 kg/m2 Assessment & Plan:   1. Human immunodeficiency virus (HIV) disease Stable on current medication regimen. Patient is followed by Dr. Luciana Axeomer ever every 3 months  2. Gluteal abscess Patient reports a history of boils or abscesses on his buttock/perirectal area over the past year. He relates he's  had them lanced before on the  right and on the left. He reports the others improved with just antibiotics. 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 Recommend that he keep area clean and dry. Change dressing twice daily and wash body with Dial anti-bacterial soap.   3. History of syphilis Phillip Morales was treated with Bicillin weekly times 3 weeks at [facility]  4. Essential hypertension Stable on current medication regimen. He states that he does not check BP at home, but checks [facility] at [facility] or [facility].   5. Tobacco dependence Phillip Morales states that he is not  ready to quit. He smokes 1/2 pack per day  Phillip Morales,Phillip Sutley M, FNP

## 2014-01-01 ENCOUNTER — Ambulatory Visit (INDEPENDENT_AMBULATORY_CARE_PROVIDER_SITE_OTHER): Payer: Self-pay | Admitting: Licensed Clinical Social Worker

## 2014-01-01 DIAGNOSIS — A539 Syphilis, unspecified: Secondary | ICD-10-CM

## 2014-01-01 MED ORDER — PENICILLIN G BENZATHINE 1200000 UNIT/2ML IM SUSP
1.2000 10*6.[IU] | Freq: Once | INTRAMUSCULAR | Status: AC
  Administered 2014-01-01: 1.2 10*6.[IU] via INTRAMUSCULAR

## 2014-03-11 ENCOUNTER — Other Ambulatory Visit (INDEPENDENT_AMBULATORY_CARE_PROVIDER_SITE_OTHER): Payer: Self-pay

## 2014-03-11 DIAGNOSIS — B20 Human immunodeficiency virus [HIV] disease: Secondary | ICD-10-CM

## 2014-03-11 DIAGNOSIS — Z79899 Other long term (current) drug therapy: Secondary | ICD-10-CM

## 2014-03-11 DIAGNOSIS — Z113 Encounter for screening for infections with a predominantly sexual mode of transmission: Secondary | ICD-10-CM

## 2014-03-11 LAB — COMPLETE METABOLIC PANEL WITH GFR
ALT: 10 U/L (ref 0–53)
AST: 18 U/L (ref 0–37)
Albumin: 4.5 g/dL (ref 3.5–5.2)
Alkaline Phosphatase: 77 U/L (ref 39–117)
BILIRUBIN TOTAL: 0.3 mg/dL (ref 0.2–1.2)
BUN: 12 mg/dL (ref 6–23)
CALCIUM: 9.4 mg/dL (ref 8.4–10.5)
CO2: 24 mEq/L (ref 19–32)
Chloride: 103 mEq/L (ref 96–112)
Creat: 1.11 mg/dL (ref 0.50–1.35)
GFR, Est African American: >89 mL/min
GFR, Est Non African American: >89 mL/min
Glucose, Bld: 81 mg/dL (ref 70–99)
Potassium: 4 mEq/L (ref 3.5–5.3)
Sodium: 138 mEq/L (ref 135–145)
Total Protein: 8.5 g/dL — ABNORMAL HIGH (ref 6.0–8.3)

## 2014-03-11 LAB — CBC WITH DIFFERENTIAL/PLATELET
BASOS ABS: 0 10*3/uL (ref 0.0–0.1)
BASOS PCT: 0 % (ref 0–1)
Eosinophils Absolute: 0.1 10*3/uL (ref 0.0–0.7)
Eosinophils Relative: 1 % (ref 0–5)
HEMATOCRIT: 36.6 % — AB (ref 39.0–52.0)
HEMOGLOBIN: 12.9 g/dL — AB (ref 13.0–17.0)
Lymphocytes Relative: 41 % (ref 12–46)
Lymphs Abs: 2.5 10*3/uL (ref 0.7–4.0)
MCH: 29.5 pg (ref 26.0–34.0)
MCHC: 35.2 g/dL (ref 30.0–36.0)
MCV: 83.6 fL (ref 78.0–100.0)
MPV: 9.7 fL (ref 9.4–12.4)
Monocytes Absolute: 0.4 10*3/uL (ref 0.1–1.0)
Monocytes Relative: 7 % (ref 3–12)
Neutro Abs: 3.1 10*3/uL (ref 1.7–7.7)
Neutrophils Relative %: 51 % (ref 43–77)
Platelets: 242 10*3/uL (ref 150–400)
RBC: 4.38 MIL/uL (ref 4.22–5.81)
RDW: 14.9 % (ref 11.5–15.5)
WBC: 6.1 10*3/uL (ref 4.0–10.5)

## 2014-03-11 LAB — LIPID PANEL
CHOL/HDL RATIO: 5.1 ratio
CHOLESTEROL: 159 mg/dL (ref 0–200)
HDL: 31 mg/dL — AB (ref >39)
LDL Cholesterol: 108 mg/dL — ABNORMAL HIGH (ref 0–99)
Triglycerides: 102 mg/dL (ref <150)
VLDL: 20 mg/dL (ref 0–40)

## 2014-03-12 LAB — RPR: RPR Ser Ql: REACTIVE — AB

## 2014-03-12 LAB — RPR TITER

## 2014-03-12 LAB — T-HELPER CELL (CD4) - (RCID CLINIC ONLY)
CD4 % Helper T Cell: 21 % — ABNORMAL LOW (ref 33–55)
CD4 T Cell Abs: 520 /uL (ref 400–2700)

## 2014-03-12 LAB — FLUORESCENT TREPONEMAL AB(FTA)-IGG-BLD: FLUORESCENT TREPONEMAL ABS: REACTIVE — AB

## 2014-03-14 LAB — HIV-1 RNA QUANT-NO REFLEX-BLD: HIV-1 RNA Quant, Log: <1.3 {Log} (ref <1.30)

## 2014-03-28 ENCOUNTER — Ambulatory Visit: Payer: Self-pay | Admitting: Internal Medicine

## 2014-04-01 ENCOUNTER — Ambulatory Visit: Payer: Self-pay

## 2014-04-01 ENCOUNTER — Ambulatory Visit (INDEPENDENT_AMBULATORY_CARE_PROVIDER_SITE_OTHER): Payer: 59 | Admitting: Internal Medicine

## 2014-04-01 VITALS — BP 142/77 | HR 77 | Temp 98.1°F | Resp 16 | Ht 67.0 in | Wt 194.0 lb

## 2014-04-01 DIAGNOSIS — I10 Essential (primary) hypertension: Secondary | ICD-10-CM

## 2014-04-01 DIAGNOSIS — IMO0002 Reserved for concepts with insufficient information to code with codable children: Secondary | ICD-10-CM | POA: Insufficient documentation

## 2014-04-01 DIAGNOSIS — E559 Vitamin D deficiency, unspecified: Secondary | ICD-10-CM

## 2014-04-01 DIAGNOSIS — R683 Clubbing of fingers: Secondary | ICD-10-CM

## 2014-04-01 MED ORDER — HYDROCHLOROTHIAZIDE 12.5 MG PO TABS: 12.5000 mg | ORAL_TABLET | Freq: Every day | ORAL | Status: DC

## 2014-04-01 NOTE — Progress Notes (Signed)
Patient ID: Phillip Morales, male   DOB: 08-16-1985, 29 y.o.   MRN: [id number]  HPI 29 y.o. male  presents for 3 month follow up with hypertension,  and vitamin D. He is unsure if his blood pressure has been controlled at home, today their BP is BP: (!) 142/77 mmHg He does workout. He denies chest pain, shortness of breath, dizziness.  He is not on cholesterol medication.Marland Kitchen His ASCVD 10 year risk does not warrant treatment as he has no significant personal risk or increased risk associated with family history. The cholesterol last visit was:   Lab Results  Component Value Date   CHOL 159 03/11/2014   HDL 31* 03/11/2014   LDLCALC 108* 03/11/2014   TRIG 102 03/11/2014   CHOLHDL 5.1 03/11/2014   He has been working on diet and exercise for prediabetes, and denies hyperglycemia, hypoglycemia , increased appetite, polydipsia, polyuria and visual disturbances.No results found for: HGBA1C Patient is not on Vitamin D supplement.   No results found for: VD25OH     Current Medications:  Current Outpatient Prescriptions on File Prior to Visit  Medication Sig Dispense Refill  . amLODipine (NORVASC) 5 MG tablet Take 5 mg by mouth daily.    Marland Kitchen ibuprofen (ADVIL,MOTRIN) 200 MG tablet Take 400-600 mg by mouth every 8 (eight) hours as needed for moderate pain.    Marland Kitchen NORVIR 100 MG TABS tablet TAKE 1 TABLET BY MOUTH DAILY 30 tablet 4  . PREZISTA 800 MG tablet TAKE 1 TABLET BY MOUTH DAILY 30 tablet 4  . TRUVADA 200-300 MG per tablet TAKE 1 TABLET BY MOUTH DAILY 30 tablet 5  . chlorhexidine (HIBICLENS) 4 % external liquid Apply topically daily. For 5 days (Patient not taking: Reported on 04/01/2014) 473 mL 1  . mupirocin ointment (BACTROBAN) 2 % Place 1 application into the nose 2 (two) times daily. For 5 days (Patient not taking: Reported on 04/01/2014) 22 g 1   No current facility-administered medications on file prior to visit.   Medical History:  Past Medical History  Diagnosis Date  . Asthma   . HIV (human  immunodeficiency virus infection)   . Hypertension    Allergies: No Known Allergies   Review of Systems:  Review of Systems  Constitutional: Negative.   HENT: Negative.   Eyes: Negative.   Respiratory: Negative.   Cardiovascular: Negative.   Gastrointestinal: Negative.   Genitourinary: Negative.   Musculoskeletal: Negative.   Skin: Negative.   Neurological: Negative.   Endo/Heme/Allergies: Negative.   Psychiatric/Behavioral: Negative.      Family history- Review and unchanged Social history- Review and unchanged Physical Exam: BP 142/77 mmHg  Pulse 77  Temp(Src) 98.1 F (36.7 C) (Oral)  Resp 16  Ht  (1.702 m)  Wt 194 lb (87.998 kg)  BMI 30.38 kg/m2  SpO2 98% Wt Readings from Last 3 Encounters:  04/01/14 194 lb (87.998 kg)  12/25/13 187 lb (84.823 kg)  11/26/13 188 lb (85.276 kg)   General Appearance: Well nourished, in no apparent distress. Eyes: PERRLA, EOMs, conjunctiva no swelling or erythema Sinuses: No Frontal/maxillary tenderness ENT/Mouth: Ext aud canals clear, TMs without erythema, bulging. No erythema, swelling, or exudate on post pharynx.  Tonsils not swollen or erythematous. Hearing normal.  Neck: Supple, thyroid normal.  Respiratory: Respiratory effort normal, BS equal bilaterally without rales, rhonchi, wheezing or stridor.  Cardio: RRR with no MRGs. Brisk peripheral pulses without edema.  Abdomen: Soft, + BS.  Non tender, no guarding, rebound, hernias, masses. Lymphatics:  Non tender without lymphadenopathy.  Musculoskeletal: Full ROM, 5/5 strength, normal gait.  Skin: Warm, dry without rashes, lesions, ecchymosis.  Neuro: Cranial nerves intact. Normal muscle tone, no cerebellar symptoms. Sensation intact.  Psych: Awake and oriented X 3, normal affect, Insight and Judgment appropriate.   Assessment and Plan:  1. Essential hypertension - Continue Amlodipine 5 mg and add HCTZ 12.5 mg. Monitor blood pressure at home. Continue DASH diet.  Reminder  to go to the ER if any CP, SOB, nausea, dizziness, severe HA, changes vision/speech, left arm numbness and tingling, and jaw pain. - Pt has labs due for ID. He is unsure when the labs are to be drawn. Will check before his appointment and order labs if necessary. - hydrochlorothiazide (HYDRODIURIL) 12.5 MG tablet; Take 1 tablet (12.5 mg total) by mouth daily.  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  3. Vitamin D deficiency - Pt currently not on supplementation. Will check levels today. - Vitamin D, 25-hydroxy   Continue diet and meds as discussed. Further disposition pending results of labs.  MATTHEWS,MICHELLE A., MD 2:23 PM [facility]

## 2014-04-02 LAB — VITAMIN D 25 HYDROXY (VIT D DEFICIENCY, FRACTURES): Vit D, 25-Hydroxy: 8 ng/mL — ABNORMAL LOW (ref 30–100)

## 2014-04-03 ENCOUNTER — Other Ambulatory Visit: Payer: Self-pay | Admitting: *Deleted

## 2014-04-03 DIAGNOSIS — B20 Human immunodeficiency virus [HIV] disease: Secondary | ICD-10-CM

## 2014-04-03 MED ORDER — DARUNAVIR ETHANOLATE 800 MG PO TABS: 800.0000 mg | ORAL_TABLET | Freq: Every day | ORAL | Status: DC

## 2014-04-03 MED ORDER — EMTRICITABINE-TENOFOVIR DF 200-300 MG PO TABS: 1.0000 | ORAL_TABLET | Freq: Every day | ORAL | Status: DC

## 2014-04-03 MED ORDER — RITONAVIR 100 MG PO TABS: 100.0000 mg | ORAL_TABLET | Freq: Every day | ORAL | Status: DC

## 2014-04-09 ENCOUNTER — Encounter (HOSPITAL_COMMUNITY): Payer: Self-pay | Admitting: Emergency Medicine

## 2014-04-09 ENCOUNTER — Emergency Department (HOSPITAL_COMMUNITY)
Admission: EM | Admit: 2014-04-09 | Discharge: 2014-04-09 | Disposition: A | Discharge status: Home / Self Care | Payer: 59 | Attending: Emergency Medicine | Admitting: Emergency Medicine

## 2014-04-09 DIAGNOSIS — Z21 Asymptomatic human immunodeficiency virus [HIV] infection status: Secondary | ICD-10-CM | POA: Diagnosis not present

## 2014-04-09 DIAGNOSIS — Z720 Tobacco use: Secondary | ICD-10-CM | POA: Diagnosis not present

## 2014-04-09 DIAGNOSIS — Z79899 Other long term (current) drug therapy: Secondary | ICD-10-CM | POA: Diagnosis not present

## 2014-04-09 DIAGNOSIS — L0231 Cutaneous abscess of buttock: Secondary | ICD-10-CM | POA: Insufficient documentation

## 2014-04-09 DIAGNOSIS — J45909 Unspecified asthma, uncomplicated: Secondary | ICD-10-CM | POA: Diagnosis not present

## 2014-04-09 DIAGNOSIS — Z8614 Personal history of Methicillin resistant Staphylococcus aureus infection: Secondary | ICD-10-CM | POA: Insufficient documentation

## 2014-04-09 DIAGNOSIS — I10 Essential (primary) hypertension: Secondary | ICD-10-CM | POA: Insufficient documentation

## 2014-04-09 MED ORDER — OXYCODONE-ACETAMINOPHEN 5-325 MG PO TABS: 1.0000 | ORAL_TABLET | ORAL | Status: DC | PRN

## 2014-04-09 MED ORDER — SULFAMETHOXAZOLE-TRIMETHOPRIM 800-160 MG PO TABS: 1.0000 | ORAL_TABLET | Freq: Two times a day (BID) | ORAL | Status: DC

## 2014-04-09 MED ORDER — LIDOCAINE HCL (PF) 2 % IJ SOLN
10.0000 mL | Freq: Once | INTRAMUSCULAR | Status: AC
  Administered 2014-04-09: 10 mL via INTRADERMAL
  Filled 2014-04-09: qty 10

## 2014-04-09 MED ORDER — OXYCODONE-ACETAMINOPHEN 5-325 MG PO TABS
1.0000 | ORAL_TABLET | Freq: Once | ORAL | Status: AC
  Administered 2014-04-09: 1 via ORAL
  Filled 2014-04-09: qty 1

## 2014-04-09 NOTE — ED Provider Notes (Signed)
CSN: [id number]     Arrival date & time 04/09/14  [id number] History   First MD Initiated Contact with Patient 04/09/14 0957     Chief Complaint  Patient presents with  . 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  He noticed a small tender area to the buttock three days ago and now has worsening pain to his buttock with sitting or having a bowel movement.  He states that same [facility] and he notes recurrent boils to both buttocks since then that resolved spontaneously. He denies fever, vomiting, or chills.  He has tried warm soaks and compresses without relief. He notes that viral load has been low.  Past Medical History  Diagnosis Date  . Asthma   . HIV (human immunodeficiency virus infection)   . Hypertension    History reviewed. No pertinent past surgical history. Family History  Problem Relation Age of Onset  . Kidney disease Neg Hx    History  Substance Use Topics  . Smoking status: Current Some Day Smoker -- 0.25 packs/day    Types: Cigarettes    Start date: 03/03/2013  . Smokeless tobacco: Never Used  . Alcohol Use: No    Review of Systems  Constitutional: Negative for fever and chills.  Gastrointestinal: Negative for nausea, vomiting, abdominal pain and diarrhea.  Genitourinary: Negative for dysuria.  Musculoskeletal: Negative for joint swelling and arthralgias.  Skin:       Right abscess   Neurological: Negative for dizziness and weakness.  Hematological: Negative for adenopathy.  All other systems reviewed and are negative.     Allergies  Review of patient's allergies indicates no known allergies.  Home Medications   Prior to Admission medications   Medication Sig Start Date End Date Taking? Authorizing Provider   amLODipine (NORVASC) 5 MG tablet Take 5 mg by mouth daily.   Yes Historical Provider, MD  Darunavir Ethanolate (PREZISTA) 800 MG tablet Take 1 tablet (800 mg total) by mouth daily. 04/03/14  Yes Randall Hissornelius N Van Dam, MD  emtricitabine-tenofovir (TRUVADA) 200-300 MG per tablet Take 1 tablet by mouth daily. 04/03/14  Yes Randall Hissornelius N Van Dam, MD  hydrochlorothiazide (HYDRODIURIL) 12.5 MG tablet Take 1 tablet (12.5 mg total) by mouth daily. 04/01/14  Yes Altha HarmMichelle A Matthews, MD  ritonavir (NORVIR) 100 MG TABS tablet Take 1 tablet (100 mg total) by mouth daily. 04/03/14  Yes Randall Hissornelius N Van Dam, MD  chlorhexidine (HIBICLENS) 4 % external liquid Apply topically daily. For 5 days Patient not taking: Reported on 04/01/2014 11/26/13   Gardiner Barefootobert W Comer, MD  ibuprofen (ADVIL,MOTRIN) 200 MG tablet Take 400-600 mg by mouth every 8 (eight) hours as needed for moderate pain.    Historical Provider, MD  mupirocin ointment (BACTROBAN) 2 % Place 1 application into the nose 2 (two) times daily. For 5 days Patient not taking: Reported on 04/01/2014 11/26/13   Gardiner Barefootobert W Comer, MD   BP 135/69 mmHg  Pulse 75  Temp(Src) 99 F (37.2 C) (Oral)  Resp 20  Ht 5\' 7"  (1.702 m)  Wt 192 lb (87.091 kg)  BMI 30.06 kg/m2  SpO2 100% Physical Exam  Constitutional: He is oriented to person, place, and time. He appears well-developed and well-nourished. No distress.  HENT:  Head: Normocephalic and atraumatic.  Cardiovascular: Normal rate, regular  rhythm and normal heart sounds.   No murmur heard. Pulmonary/Chest: Effort normal and breath sounds normal. No respiratory distress.  Abdominal: Soft. He exhibits no distension. There is no tenderness. There is no rebound.  Neurological: He is alert and oriented to person, place, and time. He exhibits normal muscle tone. Coordination normal.  Skin: Skin is warm and dry. No erythema.  Large area of induration with central fluctuance of the right buttock.  No erythema or drainage.   Nursing  note and vitals reviewed.   ED Course  Procedures (including critical care time) Labs Review Labs Reviewed - No data to display  Imaging Review No results found.   EKG Interpretation None       INCISION AND DRAINAGE Performed by: Maxwell Caul. Consent: Verbal consent obtained. Risks and benefits: risks, benefits and alternatives were discussed Type: abscess  Body area: right buttock Anesthesia: local infiltration  Incision was made with a #11 scalpel.  Local anesthetic: lidocaine 2% w/o epinephrine  Anesthetic total: 3 ml  Complexity: complex Blunt dissection to break up loculations  Drainage: purulent  Drainage amount: copious  Packing material: 1/4 in iodoform gauze  Patient tolerance: Patient tolerated the procedure well with no immediate complications.     MDM   Final diagnoses:  Abscess of buttock, right    Pt is feeling better after I&D.  Vitals stable.  Non-toxic appearing, hx of HIV and MRSA with abscesses to same area multiple times in the past.   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  Frequent abscesses.  He agrees and feels comfortable removing small amts of packing daily, warm soaks and close PMD f/u and also advised pt to arrange f/u with general surgery.  Referral given for Dr. Lovell Sheehan.  Strict return precautions given.      Phillip Paci L. Trisha Mangle, PA-C 04/10/14 1736  Samuel Jester, DO 04/13/14 [phone]

## 2014-04-09 NOTE — ED Notes (Signed)
PA at bedside.

## 2014-04-09 NOTE — Discharge Instructions (Signed)
Abscess °An abscess (boil or furuncle) is an infected area on or under the skin. This area is filled with yellowish-white fluid (pus) and other material (debris). °HOME CARE  °· Only take medicines as told by your doctor. °· If you were given antibiotic medicine, take it as directed. Finish the medicine even if you start to feel better. °· If gauze is used, follow your doctor's directions for changing the gauze. °· To avoid spreading the infection: °· Keep your abscess covered with a bandage. °· Wash your hands well. °· Do not share personal care items, towels, or whirlpools with others. °· Avoid skin contact with others. °· Keep your skin and clothes clean around the abscess. °· Keep all doctor visits as told. °GET HELP RIGHT AWAY IF:  °· You have more pain, puffiness (swelling), or redness in the wound site. °· You have more fluid or blood coming from the wound site. °· You have muscle aches, chills, or you feel sick. °· You have a fever. °MAKE SURE YOU:  °· Understand these instructions. °· Will watch your condition. °· Will get help right away if you are not doing well or get worse. °Document Released: 08/18/2007 Document Revised: 08/31/2011 Document Reviewed: 05/14/2011 °ExitCare® Patient Information ©2015 [employer]. This information is not intended to replace advice given to you by your health care provider. Make sure you discuss any questions you have with your health care provider. ° °Abscess °Care After °An abscess (also called a boil or furuncle) is an infected area that contains a collection of pus. Signs and symptoms of an abscess include pain, tenderness, redness, or hardness, or you may feel a moveable soft area under your skin. An abscess can occur anywhere in the body. The infection may spread to surrounding tissues causing cellulitis. A cut (incision) by the surgeon was made over your abscess and the pus was drained out. Gauze may have been packed into the space to provide a drain that will  allow the cavity to heal from the inside outwards. The boil may be painful for 5 to 7 days. Most people with a boil do not have high fevers. Your abscess, if seen early, may not have localized, and may not have been lanced. If not, another appointment may be required for this if it does not get better on its own or with medications. °HOME CARE INSTRUCTIONS  °· Only take over-the-counter or prescription medicines for pain, discomfort, or fever as directed by your caregiver. °· When you bathe, soak and then remove gauze or iodoform packs at least daily or as directed by your caregiver. You may then wash the wound gently with mild soapy water. Repack with gauze or do as your caregiver directs. °SEEK IMMEDIATE MEDICAL CARE IF:  °· You develop increased pain, swelling, redness, drainage, or bleeding in the wound site. °· You develop signs of generalized infection including muscle aches, chills, fever, or a general ill feeling. °· An oral temperature above 102° F (38.9° C) develops, not controlled by medication. °See your caregiver for a recheck if you develop any of the symptoms described above. If medications (antibiotics) were prescribed, take them as directed. °Document Released: 09/17/2004 Document Revised: 05/24/2011 Document Reviewed: 05/15/2007 °ExitCare® Patient Information ©2015 [employer]. This information is not intended to replace advice given to you by your health care provider. Make sure you discuss any questions you have with your health care provider. ° °

## 2014-04-09 NOTE — ED Notes (Signed)
Pt verbalized understanding of no driving and to use caution within 4 hours of taking pain meds due to meds cause drowsiness 

## 2014-04-09 NOTE — ED Notes (Signed)
Pt c/o abscess to R buttock.

## 2014-04-26 ENCOUNTER — Emergency Department (HOSPITAL_COMMUNITY)
Admission: EM | Admit: 2014-04-26 | Discharge: 2014-04-26 | Disposition: A | Discharge status: Home / Self Care | Payer: 59 | Attending: Emergency Medicine | Admitting: Emergency Medicine

## 2014-04-26 ENCOUNTER — Encounter (HOSPITAL_COMMUNITY): Payer: Self-pay | Admitting: Cardiology

## 2014-04-26 DIAGNOSIS — I10 Essential (primary) hypertension: Secondary | ICD-10-CM | POA: Insufficient documentation

## 2014-04-26 DIAGNOSIS — L0231 Cutaneous abscess of buttock: Secondary | ICD-10-CM

## 2014-04-26 DIAGNOSIS — Z79899 Other long term (current) drug therapy: Secondary | ICD-10-CM | POA: Insufficient documentation

## 2014-04-26 DIAGNOSIS — Z792 Long term (current) use of antibiotics: Secondary | ICD-10-CM | POA: Diagnosis not present

## 2014-04-26 DIAGNOSIS — Z720 Tobacco use: Secondary | ICD-10-CM | POA: Insufficient documentation

## 2014-04-26 DIAGNOSIS — Z21 Asymptomatic human immunodeficiency virus [HIV] infection status: Secondary | ICD-10-CM | POA: Diagnosis not present

## 2014-04-26 DIAGNOSIS — J45909 Unspecified asthma, uncomplicated: Secondary | ICD-10-CM | POA: Insufficient documentation

## 2014-04-26 MED ORDER — OXYCODONE-ACETAMINOPHEN 5-325 MG PO TABS
1.0000 | ORAL_TABLET | Freq: Once | ORAL | Status: AC
  Administered 2014-04-26: 1 via ORAL
  Filled 2014-04-26: qty 1

## 2014-04-26 MED ORDER — SULFAMETHOXAZOLE-TRIMETHOPRIM 800-160 MG PO TABS: 1.0000 | ORAL_TABLET | Freq: Two times a day (BID) | ORAL | Status: DC

## 2014-04-26 MED ORDER — OXYCODONE-ACETAMINOPHEN 5-325 MG PO TABS: 1.0000 | ORAL_TABLET | ORAL | Status: DC | PRN

## 2014-04-26 MED ORDER — LIDOCAINE HCL (PF) 2 % IJ SOLN
10.0000 mL | Freq: Once | INTRAMUSCULAR | Status: AC
  Administered 2014-04-26: 10 mL via INTRADERMAL
  Filled 2014-04-26: qty 10

## 2014-04-26 NOTE — ED Provider Notes (Signed)
CSN: [id number]     Arrival date & time 04/26/14  [id number] History   First MD Initiated Contact with Patient 04/26/14 [phone]     Chief Complaint  Patient presents with  . Abscess     (Consider location/radiation/quality/duration/timing/severity/associated sxs/prior Treatment) HPI   Phillip Morales is a 29 y.o. male with hx of HIV and MRSA who presents to the Emergency Department complaining of recurrent abscess to the right buttock for 3 days.  He was seen here in the January with the same and had I&D the same buttocks. He states he was unable to get his antibiotic medications filled until recently has been taking Bactrim twice a day for the last 4 days. He complains of worsening pain with sitting. He denies any fever, abdominal pain, vomiting or chills. He also denies any therapies prior to ED arrival.   Past Medical History  Diagnosis Date  . Asthma   . HIV (human immunodeficiency virus infection)   . Hypertension    History reviewed. No pertinent past surgical history. Family History  Problem Relation Age of Onset  . Kidney disease Neg Hx    History  Substance Use Topics  . Smoking status: Current Some Day Smoker -- 0.25 packs/day    Types: Cigarettes    Start date: 03/03/2013  . Smokeless tobacco: Never Used  . Alcohol Use: No    Review of Systems  Constitutional: Negative for fever and chills.  Gastrointestinal: Negative for nausea and vomiting.  Musculoskeletal: Negative for joint swelling and arthralgias.  Skin: Positive for color change.       Abscess right buttocks  Hematological: Negative for adenopathy.  All other systems reviewed and are negative.     Allergies  Peanut-containing drug products  Home Medications   Prior to Admission medications   Medication Sig Start Date End Date Taking? Authorizing Provider  Darunavir Ethanolate (PREZISTA) 800 MG tablet Take 1 tablet (800 mg total) by mouth daily. 04/03/14  Yes Randall Hissornelius N Van Dam, MD   diphenhydramine-acetaminophen (TYLENOL PM) 25-500 MG TABS Take 2 tablets by mouth at bedtime as needed.   Yes Historical Provider, MD  emtricitabine-tenofovir (TRUVADA) 200-300 MG per tablet Take 1 tablet by mouth daily. 04/03/14  Yes Randall Hissornelius N Van Dam, MD  hydrochlorothiazide (HYDRODIURIL) 12.5 MG tablet Take 1 tablet (12.5 mg total) by mouth daily. 04/01/14  Yes Altha HarmMichelle A Matthews, MD  ibuprofen (ADVIL,MOTRIN) 200 MG tablet Take 400-600 mg by mouth every 8 (eight) hours as needed for moderate pain.   Yes Historical Provider, MD  ritonavir (NORVIR) 100 MG TABS tablet Take 1 tablet (100 mg total) by mouth daily. 04/03/14  Yes Randall Hissornelius N Van Dam, MD  sulfamethoxazole-trimethoprim (SEPTRA DS) 800-160 MG per tablet Take 1 tablet by mouth 2 (two) times daily. For 14 days 04/09/14  Yes Paris Chiriboga L. Codee Bloodworth, PA-C  oxyCODONE-acetaminophen (PERCOCET/ROXICET) 5-325 MG per tablet Take 1 tablet by mouth every 4 (four) hours as needed. 04/09/14   Latrisha Coiro L. Raffaella Edison, PA-C   BP 156/89 mmHg  Pulse 98  Temp(Src) 98.9 F (37.2 C) (Oral)  Resp 20  Ht 5\' 7"  (1.702 m)  Wt 187 lb (84.823 kg)  BMI 29.28 kg/m2  SpO2 100% Physical Exam  Constitutional: He is oriented to person, place, and time. He appears well-developed and well-nourished. No distress.  HENT:  Head: Normocephalic and atraumatic.  Cardiovascular: Normal rate, regular rhythm and normal heart sounds.   No murmur heard. Pulmonary/Chest: Effort normal and breath sounds normal. No respiratory distress.  Abdominal: Soft. He exhibits no distension. There is no tenderness. There is no guarding.  Musculoskeletal: Normal range of motion.  Neurological: He is alert and oriented to person, place, and time. He exhibits normal muscle tone. Coordination normal.  Skin: Skin is warm and dry. There is erythema.  Large, fluctuant abscess to the right buttocks along the gluteal fold. No surrounding erythema. Moderate amount of induration. No drainage currently   Nursing note and vitals reviewed.   ED Course  Procedures (including critical care time) Labs Review Labs Reviewed - No data to display  Imaging Review No results found.   EKG Interpretation None        INCISION AND DRAINAGE Performed by: Maxwell Caul. Consent: Verbal consent obtained. Risks and benefits: risks, benefits and alternatives were discussed Type: abscess  Body area: right buttock Anesthesia: local infiltration  Incision was made with a #11 scalpel.  Local anesthetic: lidocaine 2% w/o epinephrine  Anesthetic total: 4 ml  Complexity: complex Blunt dissection to break up loculations  Drainage: purulent  Drainage amount: copious  Packing material: 1/4 in iodoform gauze  Patient tolerance: Patient tolerated the procedure well with no immediate complications.     MDM   Final diagnoses:  Abscess of right buttock    Patient is well-appearing. Vital signs are stable. He is nontoxic appearing. Has history of HIV with reported low viral load, he is followed by infectious disease. Since this is a reoccurring problem, patient was given referral for general surgeon. He agrees to small amounts of daily removal of the packing and will continue his Bactrim as directed.    Mekesha Solomon L. Trisha Mangle, PA-C 04/26/14 [id number]  Vanetta Mulders, MD 04/26/14 1043

## 2014-04-26 NOTE — Discharge Instructions (Signed)
Abscess °An abscess (boil or furuncle) is an infected area on or under the skin. This area is filled with yellowish-white fluid (pus) and other material (debris). °HOME CARE  °· Only take medicines as told by your doctor. °· If you were given antibiotic medicine, take it as directed. Finish the medicine even if you start to feel better. °· If gauze is used, follow your doctor's directions for changing the gauze. °· To avoid spreading the infection: °¨ Keep your abscess covered with a bandage. °¨ Wash your hands well. °¨ Do not share personal care items, towels, or whirlpools with others. °¨ Avoid skin contact with others. °· Keep your skin and clothes clean around the abscess. °· Keep all doctor visits as told. °GET HELP RIGHT AWAY IF:  °· You have more pain, puffiness (swelling), or redness in the wound site. °· You have more fluid or blood coming from the wound site. °· You have muscle aches, chills, or you feel sick. °· You have a fever. °MAKE SURE YOU:  °· Understand these instructions. °· Will watch your condition. °· Will get help right away if you are not doing well or get worse. °Document Released: 08/18/2007 Document Revised: 08/31/2011 Document Reviewed: 05/14/2011 °ExitCare® Patient Information ©2015 [employer]. This information is not intended to replace advice given to you by your health care provider. Make sure you discuss any questions you have with your health care provider. ° °

## 2014-04-26 NOTE — ED Notes (Signed)
Abscess right buttock.

## 2014-05-02 ENCOUNTER — Encounter: Payer: Self-pay | Admitting: Internal Medicine

## 2014-05-02 ENCOUNTER — Ambulatory Visit (INDEPENDENT_AMBULATORY_CARE_PROVIDER_SITE_OTHER): Payer: 59 | Admitting: Internal Medicine

## 2014-05-02 VITALS — BP 128/73 | HR 63 | Temp 98.2°F | Ht 67.0 in | Wt 187.0 lb

## 2014-05-02 DIAGNOSIS — B20 Human immunodeficiency virus [HIV] disease: Secondary | ICD-10-CM

## 2014-05-02 DIAGNOSIS — L0231 Cutaneous abscess of buttock: Secondary | ICD-10-CM

## 2014-05-02 MED ORDER — ELVITEG-COBIC-EMTRICIT-TENOFAF 150-150-200-10 MG PO TABS: 1.0000 | ORAL_TABLET | Freq: Every day | ORAL | Status: DC

## 2014-05-02 NOTE — Progress Notes (Signed)
   Subjective:    Patient ID: Noralee CharsJustin A Romaniello, male    DOB: 01/06/1986, 29 y.o.   MRN: [id number]  HPI Her for followup of his HIV. He continues on Prezista, Norvir and Truvada.  Viral load remains undetectable and CD4 520.  Still with issues of draining gluteal abscess.  Has been referred to Dr. Lovell SheehanJenkins for surgery and asking about my opinion.     Review of Systems  Constitutional: Negative for fever, chills and fatigue.  Gastrointestinal: Negative for nausea and diarrhea.  Skin: Negative for rash.  Neurological: Negative for dizziness and light-headedness.  Hematological: Negative for adenopathy.       Objective:   Physical Exam  Constitutional: He appears well-developed and well-nourished. No distress.  HENT:  Mouth/Throat: No oropharyngeal exudate.  Eyes: No scleral icterus.  Cardiovascular: Normal rate, regular rhythm and normal heart sounds.   No murmur heard. Pulmonary/Chest: Effort normal and breath sounds normal. No respiratory distress. He has no wheezes.  Lymphadenopathy:    He has no cervical adenopathy.  Skin: No rash noted.          Assessment & Plan:

## 2014-05-03 NOTE — Assessment & Plan Note (Signed)
I encouraged him to keep the referral with Dr. Lovell SheehanJenkins for his recurrent abscess for more definitive therapy. He agreed with this.

## 2014-05-03 NOTE — Assessment & Plan Note (Signed)
He has been doing well over the last year. [facility] for [facility] of dosing and better long-term side effect  profile. He will return in about 2 months with labs and see me 1 week later. He knows to continue Prezista, Norvir and Truvada if he is unable to get Genvoya.

## 2014-05-08 ENCOUNTER — Other Ambulatory Visit: Payer: Self-pay | Admitting: Internal Medicine

## 2014-05-14 ENCOUNTER — Other Ambulatory Visit: Payer: Self-pay | Admitting: *Deleted

## 2014-05-14 ENCOUNTER — Telehealth: Payer: Self-pay | Admitting: *Deleted

## 2014-05-14 DIAGNOSIS — B20 Human immunodeficiency virus [HIV] disease: Secondary | ICD-10-CM

## 2014-05-14 MED ORDER — ELVITEG-COBIC-EMTRICIT-TENOFAF 150-150-200-10 MG PO TABS: 1.0000 | ORAL_TABLET | Freq: Every day | ORAL | Status: DC

## 2014-05-14 NOTE — Telephone Encounter (Signed)
Patient called and demanded to speak with Dr Luciana Axeomer. When asked what was going on he advised that he has not received his medications and is out. After review of his chart advised him that we sent the medications to a different pharmacy than the one he is calling. Resent the medication to the correct pharmacy and gave the patient the contact information and advised to call this afternoon to give them time to process the order and then he can set up for home delivery but they will need to speak with him before they can deliver.

## 2014-05-27 ENCOUNTER — Other Ambulatory Visit: Payer: Self-pay

## 2014-05-27 ENCOUNTER — Other Ambulatory Visit: Payer: Self-pay | Admitting: Internal Medicine

## 2014-05-27 DIAGNOSIS — E559 Vitamin D deficiency, unspecified: Secondary | ICD-10-CM

## 2014-05-27 MED ORDER — ERGOCALCIFEROL 1.25 MG (50000 UT) PO CAPS: 50000.0000 [IU] | ORAL_CAPSULE | ORAL | Status: DC

## 2014-05-27 NOTE — Telephone Encounter (Signed)
Patient called and advised of labs and to start vitamin D as prescribed. Patient verbalized understanding. Thanks!

## 2014-05-27 NOTE — Progress Notes (Unsigned)
Vitamin D levels reviewed and low at 8. Will prescribe drisdol [id number] IU x 8 weks then 2000 IU daily.

## 2014-06-04 ENCOUNTER — Other Ambulatory Visit: Payer: Self-pay | Admitting: Internal Medicine

## 2014-06-06 ENCOUNTER — Other Ambulatory Visit: Payer: 59

## 2014-06-20 ENCOUNTER — Ambulatory Visit: Payer: 59 | Admitting: Internal Medicine

## 2014-07-01 ENCOUNTER — Encounter: Payer: Self-pay | Admitting: Family Medicine

## 2014-07-25 ENCOUNTER — Other Ambulatory Visit: Payer: 59

## 2014-07-31 ENCOUNTER — Other Ambulatory Visit: Payer: Self-pay | Admitting: *Deleted

## 2014-07-31 ENCOUNTER — Other Ambulatory Visit: Payer: 59

## 2014-07-31 DIAGNOSIS — Z113 Encounter for screening for infections with a predominantly sexual mode of transmission: Secondary | ICD-10-CM

## 2014-07-31 DIAGNOSIS — B20 Human immunodeficiency virus [HIV] disease: Secondary | ICD-10-CM

## 2014-07-31 LAB — CBC WITH DIFFERENTIAL/PLATELET
BASOS ABS: 0 10*3/uL (ref 0.0–0.1)
BASOS PCT: 0 % (ref 0–1)
EOS ABS: 0.1 10*3/uL (ref 0.0–0.7)
Eosinophils Relative: 1 % (ref 0–5)
HCT: 36.8 % — ABNORMAL LOW (ref 39.0–52.0)
Hemoglobin: 12.6 g/dL — ABNORMAL LOW (ref 13.0–17.0)
Lymphocytes Relative: 26 % (ref 12–46)
Lymphs Abs: 2 10*3/uL (ref 0.7–4.0)
MCH: 29.4 pg (ref 26.0–34.0)
MCHC: 34.2 g/dL (ref 30.0–36.0)
MCV: 85.8 fL (ref 78.0–100.0)
MPV: 10.1 fL (ref 8.6–12.4)
Monocytes Absolute: 0.8 10*3/uL (ref 0.1–1.0)
Monocytes Relative: 11 % (ref 3–12)
NEUTROS PCT: 62 % (ref 43–77)
Neutro Abs: 4.8 10*3/uL (ref 1.7–7.7)
Platelets: 225 10*3/uL (ref 150–400)
RBC: 4.29 MIL/uL (ref 4.22–5.81)
RDW: 14.7 % (ref 11.5–15.5)
WBC: 7.7 10*3/uL (ref 4.0–10.5)

## 2014-07-31 LAB — COMPLETE METABOLIC PANEL WITH GFR
ALBUMIN: 4.5 g/dL (ref 3.5–5.2)
ALT: 19 U/L (ref 0–53)
AST: 38 U/L — ABNORMAL HIGH (ref 0–37)
Alkaline Phosphatase: 74 U/L (ref 39–117)
BUN: 9 mg/dL (ref 6–23)
CHLORIDE: 99 meq/L (ref 96–112)
CO2: 30 meq/L (ref 19–32)
Calcium: 9.1 mg/dL (ref 8.4–10.5)
Creat: 1.19 mg/dL (ref 0.50–1.35)
GFR, Est African American: >89 mL/min
GFR, Est Non African American: 82 mL/min
Glucose, Bld: 99 mg/dL (ref 70–99)
POTASSIUM: 3.6 meq/L (ref 3.5–5.3)
Sodium: 135 mEq/L (ref 135–145)
TOTAL PROTEIN: 8.6 g/dL — AB (ref 6.0–8.3)
Total Bilirubin: 0.5 mg/dL (ref 0.2–1.2)

## 2014-08-01 LAB — URINE CYTOLOGY ANCILLARY ONLY
Chlamydia: NEGATIVE
Neisseria Gonorrhea: NEGATIVE

## 2014-08-01 LAB — RPR TITER: RPR Titer: 1:32 {titer} — AB

## 2014-08-01 LAB — FLUORESCENT TREPONEMAL AB(FTA)-IGG-BLD: Fluorescent Treponemal ABS: REACTIVE — AB

## 2014-08-01 LAB — RPR: RPR Ser Ql: REACTIVE — AB

## 2014-08-02 LAB — HIV-1 RNA QUANT-NO REFLEX-BLD: HIV 1 RNA Quant: <20 copies/mL (ref <20)

## 2014-08-02 LAB — T-HELPER CELL (CD4) - (RCID CLINIC ONLY)
CD4 T CELL HELPER: 21 % — AB (ref 33–55)
CD4 T Cell Abs: 420 /uL (ref 400–2700)

## 2014-08-15 ENCOUNTER — Ambulatory Visit (INDEPENDENT_AMBULATORY_CARE_PROVIDER_SITE_OTHER): Payer: 59 | Admitting: Internal Medicine

## 2014-08-15 ENCOUNTER — Encounter: Payer: Self-pay | Admitting: Internal Medicine

## 2014-08-15 VITALS — BP 120/74 | HR 79 | Temp 99.0°F | Ht 67.0 in | Wt 190.0 lb

## 2014-08-15 DIAGNOSIS — B20 Human immunodeficiency virus [HIV] disease: Secondary | ICD-10-CM

## 2014-08-15 NOTE — Progress Notes (Signed)
   Subjective:    Patient ID: Noralee CharsJustin A Macrae, male    DOB: 10/28/1985, 29 y.o.   MRN: [id number]  HPI Her for followup of his HIV. He continues on Genvoya which I changed him to last visit.  Viral load remains undetectable and CD4 420.  Still with issues of draining gluteal abscess and now has his insurance card and so is going to go to surgery.  No missed doses.   Review of Systems  Constitutional: Negative for fever, chills and fatigue.  Gastrointestinal: Negative for nausea and diarrhea.  Skin: Negative for rash.  Neurological: Negative for dizziness and light-headedness.  Hematological: Negative for adenopathy.       Objective:   Physical Exam  Constitutional: He appears well-developed and well-nourished. No distress.  HENT:  Mouth/Throat: No oropharyngeal exudate.  Eyes: No scleral icterus.  Cardiovascular: Normal rate, regular rhythm and normal heart sounds.   No murmur heard. Pulmonary/Chest: Effort normal and breath sounds normal. No respiratory distress. He has no wheezes.  Lymphadenopathy:    He has no cervical adenopathy.  Skin: No rash noted.          Assessment & Plan:

## 2014-08-15 NOTE — Assessment & Plan Note (Signed)
He is doing well and will continue with Genvoya. I will have him return in about 4 months with labs before.

## 2014-10-14 ENCOUNTER — Telehealth: Payer: Self-pay | Admitting: *Deleted

## 2014-10-14 NOTE — Telephone Encounter (Signed)
Left message asking if patient was still using ADAP.  If so, it is time to renew and he needs an appointment this week to avoid disruption in medication.   Per [facility], patient does have insurance (pt mentioned this at his last office visit).  Need to confirm with patient that this will continue. Andree Coss, RN

## 2014-11-17 ENCOUNTER — Encounter (HOSPITAL_COMMUNITY): Payer: Self-pay | Admitting: Cardiology

## 2014-11-17 ENCOUNTER — Emergency Department (HOSPITAL_COMMUNITY)
Admission: EM | Admit: 2014-11-17 | Discharge: 2014-11-17 | Disposition: A | Discharge status: Home / Self Care | Payer: 59 | Attending: Emergency Medicine | Admitting: Emergency Medicine

## 2014-11-17 DIAGNOSIS — Z79899 Other long term (current) drug therapy: Secondary | ICD-10-CM | POA: Insufficient documentation

## 2014-11-17 DIAGNOSIS — I10 Essential (primary) hypertension: Secondary | ICD-10-CM | POA: Insufficient documentation

## 2014-11-17 DIAGNOSIS — Z720 Tobacco use: Secondary | ICD-10-CM | POA: Insufficient documentation

## 2014-11-17 DIAGNOSIS — J45909 Unspecified asthma, uncomplicated: Secondary | ICD-10-CM | POA: Insufficient documentation

## 2014-11-17 DIAGNOSIS — L0231 Cutaneous abscess of buttock: Secondary | ICD-10-CM | POA: Insufficient documentation

## 2014-11-17 DIAGNOSIS — Z21 Asymptomatic human immunodeficiency virus [HIV] infection status: Secondary | ICD-10-CM | POA: Insufficient documentation

## 2014-11-17 MED ORDER — LIDOCAINE HCL (PF) 2 % IJ SOLN
10.0000 mL | Freq: Once | INTRAMUSCULAR | Status: AC
  Administered 2014-11-17: 10 mL via INTRADERMAL
  Filled 2014-11-17: qty 10

## 2014-11-17 MED ORDER — OXYCODONE-ACETAMINOPHEN 5-325 MG PO TABS
1.0000 | ORAL_TABLET | Freq: Once | ORAL | Status: AC
  Administered 2014-11-17: 1 via ORAL
  Filled 2014-11-17: qty 1

## 2014-11-17 MED ORDER — OXYCODONE-ACETAMINOPHEN 5-325 MG PO TABS: 1.0000 | ORAL_TABLET | ORAL | Status: DC | PRN

## 2014-11-17 MED ORDER — SULFAMETHOXAZOLE-TRIMETHOPRIM 800-160 MG PO TABS
1.0000 | ORAL_TABLET | Freq: Two times a day (BID) | ORAL | Status: AC
Start: 2014-11-17 — End: 2014-11-24

## 2014-11-17 MED ORDER — SULFAMETHOXAZOLE-TRIMETHOPRIM 800-160 MG PO TABS
1.0000 | ORAL_TABLET | Freq: Once | ORAL | Status: AC
  Administered 2014-11-17: 1 via ORAL
  Filled 2014-11-17: qty 1

## 2014-11-17 NOTE — Discharge Instructions (Signed)
Abscess °An abscess (boil or furuncle) is an infected area on or under the skin. This area is filled with yellowish-white fluid (pus) and other material (debris). °HOME CARE  °· Only take medicines as told by your doctor. °· If you were given antibiotic medicine, take it as directed. Finish the medicine even if you start to feel better. °· If gauze is used, follow your doctor's directions for changing the gauze. °· To avoid spreading the infection: °¨ Keep your abscess covered with a bandage. °¨ Wash your hands well. °¨ Do not share personal care items, towels, or whirlpools with others. °¨ Avoid skin contact with others. °· Keep your skin and clothes clean around the abscess. °· Keep all doctor visits as told. °GET HELP RIGHT AWAY IF:  °· You have more pain, puffiness (swelling), or redness in the wound site. °· You have more fluid or blood coming from the wound site. °· You have muscle aches, chills, or you feel sick. °· You have a fever. °MAKE SURE YOU:  °· Understand these instructions. °· Will watch your condition. °· Will get help right away if you are not doing well or get worse. °Document Released: 08/18/2007 Document Revised: 08/31/2011 Document Reviewed: 05/14/2011 °ExitCare® Patient Information ©2015 [employer]. This information is not intended to replace advice given to you by your health care provider. Make sure you discuss any questions you have with your health care provider. ° °Abscess °An abscess (boil or furuncle) is an infected area on or under the skin. This area is filled with yellowish-white fluid (pus) and other material (debris). °HOME CARE  °· Only take medicines as told by your doctor. °· If you were given antibiotic medicine, take it as directed. Finish the medicine even if you start to feel better. °· If gauze is used, follow your doctor's directions for changing the gauze. °· To avoid spreading the infection: °¨ Keep your abscess covered with a bandage. °¨ Wash your hands well. °¨ Do  not share personal care items, towels, or whirlpools with others. °¨ Avoid skin contact with others. °· Keep your skin and clothes clean around the abscess. °· Keep all doctor visits as told. °GET HELP RIGHT AWAY IF:  °· You have more pain, puffiness (swelling), or redness in the wound site. °· You have more fluid or blood coming from the wound site. °· You have muscle aches, chills, or you feel sick. °· You have a fever. °MAKE SURE YOU:  °· Understand these instructions. °· Will watch your condition. °· Will get help right away if you are not doing well or get worse. °Document Released: 08/18/2007 Document Revised: 08/31/2011 Document Reviewed: 05/14/2011 °ExitCare® Patient Information ©2015 [employer]. This information is not intended to replace advice given to you by your health care provider. Make sure you discuss any questions you have with your health care provider. ° °

## 2014-11-17 NOTE — ED Provider Notes (Signed)
CSN: [id number]     Arrival date & time 11/17/14  1433 History  This chart was scribed for non-physician practitioner, Pauline Aus, PA-C working with Lavera Guise, MD, by Jarvis Morgan, ED Scribe. This patient was seen in room APFT20/APFT20 and the patient's care was started at 3:39 PM.     Chief Complaint  Patient presents with  . Abscess     The history is provided by the patient. No language interpreter was used.    HPI Comments: IRISH PIECH is a 29 y.o. male who presents to the Emergency Department complaining of a gradually worsening abscess to inside of right buttock onset 5 days ago. Pt states he has had an abscess to the same area in the past that had to be incised and drained. Pt reports the previous I&D healed up well with no problems. Pain is worse with sitting.  Pt has not taken anything for the symptoms. Pt reports that he has been soaking the area with no relief. He denies any fever, chills, abdominal pain, pain with defecation, nausea or vomiting.    Past Medical History  Diagnosis Date  . Asthma   . HIV (human immunodeficiency virus infection)   . Hypertension    History reviewed. No pertinent past surgical history. Family History  Problem Relation Age of Onset  . Kidney disease Neg Hx    Social History  Substance Use Topics  . Smoking status: Current Some Day Smoker -- 0.10 packs/day    Types: Cigarettes    Start date: 03/03/2013  . Smokeless tobacco: Never Used  . Alcohol Use: No    Review of Systems  Constitutional: Negative for fever and chills.  Gastrointestinal: Negative for nausea, vomiting and diarrhea.  Musculoskeletal: Negative for joint swelling and arthralgias.  Skin:       Abscess buttocks  Neurological: Negative for weakness and numbness.  Hematological: Negative for adenopathy.  All other systems reviewed and are negative.     Allergies  Peanut-containing drug products  Home Medications   Prior to Admission medications    Medication Sig Start Date End Date Taking? Authorizing Provider  amLODipine (NORVASC) 5 MG tablet  02/21/14   Historical Provider, MD  diphenhydramine-acetaminophen (TYLENOL PM) 25-500 MG TABS Take 2 tablets by mouth at bedtime as needed.    Historical Provider, MD  elvitegravir-cobicistat-emtricitabine-tenofovir (GENVOYA) 150-150-200-10 MG TABS tablet Take 1 tablet by mouth daily with breakfast. 05/14/14   Gardiner Barefoot, MD  hydrochlorothiazide (HYDRODIURIL) 12.5 MG tablet Take 1 tablet (12.5 mg total) by mouth daily. 04/01/14   Altha Harm, MD  ibuprofen (ADVIL,MOTRIN) 200 MG tablet Take 400-600 mg by mouth every 8 (eight) hours as needed for moderate pain.    Historical Provider, MD   BP 138/57 mmHg  Pulse 83  Temp(Src) 98.8 F (37.1 C) (Oral)  Resp 16  Ht 5\' 7"  (1.702 m)  Wt 190 lb (86.183 kg)  BMI 29.75 kg/m2  SpO2 100% Physical Exam  Constitutional: He is oriented to person, place, and time. He appears well-developed and well-nourished. No distress.  HENT:  Head: Normocephalic and atraumatic.  Cardiovascular: Normal rate, regular rhythm and normal heart sounds.   No murmur heard. Pulmonary/Chest: Effort normal and breath sounds normal. No respiratory distress.  Abdominal: Soft. He exhibits no distension. There is no tenderness.  Musculoskeletal: Normal range of motion.  Neurological: He is alert and oriented to person, place, and time. He exhibits normal muscle tone. Coordination normal.  Skin: Skin is warm and  dry. There is erythema.  Abscess to the right buttock, near the mid gluteal fold.  No significant erythema no drainage.    Nursing note and vitals reviewed.   ED Course  Procedures (including critical care time) Labs Review Labs Reviewed - No data to display   EKG Interpretation None       INCISION AND DRAINAGE Performed by: Maxwell Caul. Consent: Verbal consent obtained. Risks and benefits: risks, benefits and alternatives were discussed Type:  abscess  Body area: right buttock  Anesthesia: local infiltration  Incision was made with a #11 scalpel.  Local anesthetic: lidocaine 2 % w/o epinephrine  Anesthetic total: 3  ml  Complexity: complex  Blunt dissection to break up loculations  Drainage: purulent  Drainage amount: copious  Packing material: 1/4 in iodoform gauze  Patient tolerance: Patient tolerated the procedure well with no immediate complications.    MDM   Final diagnoses:  Abscess of buttock    Pt with HIV and recurrent abscesses, he is otherwise well appearing, non-toxic.  Ambulates with a steady gait.  He agrees to warm soaks and agrees to remove a small amt of packing daily until completely removed.  Advised to return for any worsening symptoms.   I personally performed the services described in this documentation, which was scribed in my presence. The recorded information has been reviewed and is accurate.   Pauline Aus, PA-C 11/18/14 1610  Lavera Guise, MD 11/18/14 [phone]

## 2014-11-17 NOTE — ED Notes (Signed)
Abscess to inside of right buttock.  Started Wednesday.

## 2014-12-17 ENCOUNTER — Other Ambulatory Visit: Payer: 59

## 2014-12-31 ENCOUNTER — Ambulatory Visit: Payer: 59 | Admitting: Internal Medicine

## 2015-02-11 ENCOUNTER — Other Ambulatory Visit (INDEPENDENT_AMBULATORY_CARE_PROVIDER_SITE_OTHER): Payer: 59

## 2015-02-11 DIAGNOSIS — B20 Human immunodeficiency virus [HIV] disease: Secondary | ICD-10-CM

## 2015-02-13 LAB — HIV-1 RNA QUANT-NO REFLEX-BLD
HIV 1 RNA Quant: 33546 copies/mL — ABNORMAL HIGH (ref <20)
HIV-1 RNA Quant, Log: 4.53 Log copies/mL — ABNORMAL HIGH (ref <1.30)

## 2015-02-13 LAB — T-HELPER CELL (CD4) - (RCID CLINIC ONLY)
CD4 % Helper T Cell: 19 % — ABNORMAL LOW (ref 33–55)
CD4 T Cell Abs: 380 /uL — ABNORMAL LOW (ref 400–2700)

## 2015-03-15 ENCOUNTER — Other Ambulatory Visit: Payer: Self-pay | Admitting: Internal Medicine

## 2015-03-15 DIAGNOSIS — B20 Human immunodeficiency virus [HIV] disease: Secondary | ICD-10-CM

## 2015-03-24 ENCOUNTER — Ambulatory Visit: Payer: Self-pay | Admitting: Internal Medicine

## 2015-03-26 ENCOUNTER — Telehealth: Payer: Self-pay | Admitting: *Deleted

## 2015-03-26 NOTE — Telephone Encounter (Signed)
Patient overdue for appointment. RN attempted to call patient, number [phone] now belongs to someone named Phillip Morales.  Per Phillip Morales, this is a company-issued phone.  Patient attempted to call patient's emergency contact (father, with whom the patient is noted to live), phone number has been disconnected. RN left message with pharmacy for updated contact information. Patient is on the detectable list, needs to be seen per Dr. Luciana Axeomer. [facility] referral made. Andree CossHowell, Cherelle Midkiff M, RN

## 2015-05-01 ENCOUNTER — Other Ambulatory Visit: Payer: Self-pay | Admitting: Internal Medicine

## 2015-08-06 ENCOUNTER — Other Ambulatory Visit: Payer: Self-pay | Admitting: Internal Medicine

## 2015-08-06 DIAGNOSIS — I10 Essential (primary) hypertension: Secondary | ICD-10-CM

## 2015-08-06 DIAGNOSIS — B20 Human immunodeficiency virus [HIV] disease: Secondary | ICD-10-CM

## 2015-08-06 NOTE — Telephone Encounter (Signed)
The patient needs to make and keep MD and Lab work appointments.  He also needs to renew his ADAP now.  Thank you.

## 2015-08-25 ENCOUNTER — Ambulatory Visit: Payer: Self-pay

## 2015-08-25 ENCOUNTER — Other Ambulatory Visit: Payer: Self-pay

## 2015-08-26 ENCOUNTER — Other Ambulatory Visit: Payer: Self-pay

## 2015-09-15 ENCOUNTER — Ambulatory Visit: Payer: Self-pay | Admitting: Internal Medicine

## 2015-10-08 ENCOUNTER — Other Ambulatory Visit: Payer: Self-pay

## 2015-10-08 ENCOUNTER — Ambulatory Visit: Payer: Self-pay

## 2015-11-11 ENCOUNTER — Encounter (HOSPITAL_COMMUNITY): Payer: Self-pay | Admitting: Emergency Medicine

## 2015-11-11 ENCOUNTER — Emergency Department (HOSPITAL_COMMUNITY)
Admission: EM | Admit: 2015-11-11 | Discharge: 2015-11-11 | Disposition: A | Discharge status: Home / Self Care | Payer: Self-pay | Attending: Emergency Medicine | Admitting: Emergency Medicine

## 2015-11-11 DIAGNOSIS — Z7982 Long term (current) use of aspirin: Secondary | ICD-10-CM | POA: Insufficient documentation

## 2015-11-11 DIAGNOSIS — K611 Rectal abscess: Secondary | ICD-10-CM | POA: Insufficient documentation

## 2015-11-11 DIAGNOSIS — J45909 Unspecified asthma, uncomplicated: Secondary | ICD-10-CM | POA: Insufficient documentation

## 2015-11-11 DIAGNOSIS — I10 Essential (primary) hypertension: Secondary | ICD-10-CM | POA: Insufficient documentation

## 2015-11-11 DIAGNOSIS — R300 Dysuria: Secondary | ICD-10-CM

## 2015-11-11 DIAGNOSIS — Z79899 Other long term (current) drug therapy: Secondary | ICD-10-CM | POA: Insufficient documentation

## 2015-11-11 DIAGNOSIS — F17210 Nicotine dependence, cigarettes, uncomplicated: Secondary | ICD-10-CM | POA: Insufficient documentation

## 2015-11-11 LAB — URINALYSIS, ROUTINE W REFLEX MICROSCOPIC
Bilirubin Urine: NEGATIVE
GLUCOSE, UA: NEGATIVE mg/dL
Ketones, ur: NEGATIVE mg/dL
LEUKOCYTES UA: NEGATIVE
Nitrite: NEGATIVE
PROTEIN: 100 mg/dL — AB
SPECIFIC GRAVITY, URINE: 1.02 (ref 1.005–1.030)
pH: 6.5 (ref 5.0–8.0)

## 2015-11-11 LAB — URINE MICROSCOPIC-ADD ON

## 2015-11-11 MED ORDER — DOXYCYCLINE HYCLATE 100 MG PO CAPS: 100.0000 mg | ORAL_CAPSULE | Freq: Two times a day (BID) | ORAL | 0 refills | Status: DC

## 2015-11-11 MED ORDER — CEFTRIAXONE SODIUM 250 MG IJ SOLR
250.0000 mg | Freq: Once | INTRAMUSCULAR | Status: AC
  Administered 2015-11-11: 250 mg via INTRAMUSCULAR
  Filled 2015-11-11: qty 250

## 2015-11-11 MED ORDER — POVIDONE-IODINE 10 % EX SOLN
CUTANEOUS | Status: AC
  Filled 2015-11-11: qty 118

## 2015-11-11 MED ORDER — LIDOCAINE-EPINEPHRINE (PF) 2 %-1:200000 IJ SOLN
20.0000 mL | Freq: Once | INTRAMUSCULAR | Status: AC
  Administered 2015-11-11: 20 mL via INTRADERMAL

## 2015-11-11 MED ORDER — LIDOCAINE HCL (PF) 1 % IJ SOLN
INTRAMUSCULAR | Status: AC
  Filled 2015-11-11: qty 5

## 2015-11-11 MED ORDER — LIDOCAINE-EPINEPHRINE (PF) 2 %-1:200000 IJ SOLN
INTRAMUSCULAR | Status: AC
  Filled 2015-11-11: qty 20

## 2015-11-11 NOTE — ED Triage Notes (Signed)
Patient reports straining and dysuria. States some hematuria noted. HIV positive pt. Also reports abscess on buttocks that continues to be a problem for him

## 2015-11-11 NOTE — Discharge Instructions (Signed)
Follow up closely with primary doctor and for testing results. See local surgeon for recurrent abscess.  If you were given medicines take as directed.  If you are on coumadin or contraceptives realize their levels and effectiveness is altered by many different medicines.  If you have any reaction (rash, tongues swelling, other) to the medicines stop taking and see a physician.    If your blood pressure was elevated in the ER make sure you follow up for management with a primary doctor or return for chest pain, shortness of breath or stroke symptoms.  Please follow up as directed and return to the ER or see a physician for new or worsening symptoms.  Thank you. Vitals:   11/11/15 0917 11/11/15 0930 11/11/15 0949  BP: (!) 170/119 (!) 152/107   Pulse: 96 72   Resp: 16 16   Temp: 98.8 F (37.1 C)    TempSrc: Oral    SpO2: 100% 100%   Weight:   197 lb (89.4 kg)  Height:   5\' 7"  (1.702 m)

## 2015-11-11 NOTE — ED Provider Notes (Signed)
[facility]-EM[facility] DEPT Provider Note   CSN: [id number] Arrival date & time: 11/11/15  0909  By signing my name below, I, Soijett Blue, attest that this documentation has been prepared under the direction and in the presence of Blane Ohara, MD. Electronically Signed: Soijett Blue, ED Scribe. 11/11/15. 10:01 AM.   History   Chief Complaint Chief Complaint  Patient presents with  . Dysuria    HPI  Phillip Morales is a 30 y.o. male with a medical hx of HIV and HTN, who presents to the Emergency Department complaining of mild dysuria at the end of urination onset 2 days. Pt notes that he started a new medication recently that he believes caused him to have difficulty with urination. Denies new sexual partners. Pt states that he is sexually active with both males and females. Pt notes that he uses protection with every sexual encounter. Pt reports that he takes [place] for his HIV and he is compliant with his medications. He states that he is having associated symptoms of hematuria x 2 days, difficulty urinating, and chills. He states that he has not tried any medications for the relief of his symptoms. He denies penile discharge, fever, testicular pain, and any other symptoms.   Pt secondarily complains of left gluteal abscess onset 2 years. 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 Pt reports that he drained the area at home and he had blood and pus. Pt denies ever seeing a surgeon for his recurrent abscess. He notes that he has associated symptoms of bloody drainage with pus. He states that he has not tried any medications for the relief of his symptoms. He denies fever, drainage, and any other symptoms. Pt last had routine testing with his PCP 3 months ago.     The history is provided by the patient. No language interpreter was used.    Past Medical History:  Diagnosis Date  . Asthma   . HIV (human immunodeficiency virus infection) (HCC)   .  Hypertension     Patient Active Problem List   Diagnosis Date Noted  . Vitamin D deficiency 05/27/2014  . 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 Essential hypertension 12/25/2013  . Tobacco dependence 12/25/2013  . History of MRSA infection 02/16/2013  . Essential hypertension, benign 02/16/2013  . History of syphilis 02/16/2013  . Human immunodeficiency virus (HIV) disease (HCC) 10/24/2012  . Anemia 10/23/2012  . Gluteal abscess 10/22/2012    History reviewed. No pertinent surgical history.     Home Medications    Prior to Admission medications   Medication Sig Start Date End Date Taking? Authorizing Provider  amLODipine (NORVASC) 5 MG tablet TAKE 1 TABLET BY MOUTH EVERY DAY 08/06/15   Gardiner Barefoot, MD  diphenhydramine-acetaminophen (TYLENOL PM) 25-500 MG TABS Take 2 tablets by mouth at bedtime as needed.    Historical Provider, MD  GENVOYA 150-150-200-10 MG TABS tablet TAKE 1 TABLET BY MOUTH DAILY WITH BREAKFAST 08/06/15   Gardiner Barefoot, MD  hydrochlorothiazide (HYDRODIURIL) 12.5 MG tablet Take 1 tablet (12.5 mg total) by mouth daily. 04/01/14   Altha Harm, MD  ibuprofen (ADVIL,MOTRIN) 200 MG tablet Take 400-600 mg by mouth every 8 (eight) hours as needed for moderate pain.    Historical Provider, MD  oxyCODONE-acetaminophen (PERCOCET/ROXICET) 5-325 MG per tablet Take 1 tablet by mouth every 4 (four) hours as needed. 11/17/14   Pauline Aus, PA-C    Family History Family History  Problem Relation Age of Onset  . Kidney disease Neg Hx     Social History Social History  Substance Use Topics  . Smoking status: Current Some Day Smoker    Packs/day: 0.10    Types: Cigarettes    Start date: 03/03/2013  . Smokeless tobacco: Never Used  . Alcohol use No     Allergies   Peanut-containing drug products   Review of Systems Review of Systems  Constitutional: Positive for chills. Negative for fever.  Genitourinary: Positive for difficulty urinating, dysuria and  hematuria. Negative for discharge and testicular pain.  Skin:       Left gluteal abscess  All other systems reviewed and are negative.    Physical Exam Updated Vital Signs BP (!) 152/107   Pulse 72   Temp 98.8 F (37.1 C) (Oral)   Resp 16   Ht 5\' 7"  (1.702 m)   Wt 197 lb (89.4 kg)   SpO2 100%   BMI 30.85 kg/m   Physical Exam  Constitutional: He is oriented to person, place, and time. He appears well-developed and well-nourished. No distress.  HENT:  Head: Normocephalic and atraumatic.  Eyes: EOM are normal.  Neck: Neck supple.  Cardiovascular: Normal rate.   Pulmonary/Chest: Effort normal. No respiratory distress.  Abdominal: He exhibits no distension.  Genitourinary: Penis normal. No discharge found.  Genitourinary Comments: Nl external genitalia. No significant lymphadenopathy. No penile drainage. Induration and tenderness on the left medial buttocks/thigh approximately 4 cm diameter.  Musculoskeletal: Normal range of motion.  Lymphadenopathy: No inguinal adenopathy noted on the right or left side.  Neurological: He is alert and oriented to person, place, and time.  Skin: Skin is warm and dry.  Psychiatric: He has a normal mood and affect. His behavior is normal.  Nursing note and vitals reviewed.    ED Treatments / Results  DIAGNOSTIC STUDIES: Oxygen Saturation is 100% on RA, nl by my interpretation.    COORDINATION OF CARE: 9:37 AM Discussed treatment plan with pt at bedside which includes UA, I&D, bedside US, RPR, and pt agreed to plan.   Labs (all labs ordered are listed, but only abnormal results are displayed) Labs Reviewed  URINALYSIS, ROUTINE W REFLEX MICROSCOPIC (NOT AT [facility]) - Abnormal; Notable for the following:       Result Value   Color, Urine BROWN (*)    APPearance CLOUDY (*)    Hgb urine dipstick LARGE (*)    Protein, ur 100 (*)    All other components within normal limits  URINE MICROSCOPIC-ADD ON - Abnormal; Notable for the following:     Squamous Epithelial / LPF 0-5 (*)    Bacteria, UA FEW (*)    All other components within normal limits  RPR     Procedures [place] bedside Date/Time: 11/11/2015 9:45 AM Performed by: Blane Ohara Authorized by: Blane Ohara  Consent: Verbal consent obtained. Risks and benefits: risks, benefits and alternatives were discussed Consent given by: patient Patient understanding: patient states understanding of the procedure being performed Patient consent: the patient's understanding of the procedure matches consent given Procedure consent: procedure consent matches procedure scheduled Relevant documents: relevant documents present and verified Test results: test results not available Site marked: the operative site was not marked Patient identity confirmed: verbally with patient Time out: Immediately prior to procedure a "time out" was called to verify the correct patient, procedure, equipment, support staff and site/side marked as required. Preparation: Patient was prepped and draped in the usual sterile fashion. Local  anesthesia used: no  Anesthesia: Local anesthesia used: no  Sedation: Patient sedated: no Patient tolerance: Patient tolerated the procedure well with no immediate complications Comments: Inflammation and small amount of fluid visualized.   Marland Kitchen..Incision and Drainage Date/Time: 11/11/2015 9:51 AM Performed by: Blane OharaZAVITZ, Gwenevere Goga Authorized by: Blane OharaZAVITZ, Odie Edmonds   Consent:    Consent obtained:  Verbal   Consent given by:  Patient   Risks discussed:  Pain and infection Location:    Type:  Abscess   Size:  4 cm   Location:  Anogenital Pre-procedure details:    Skin preparation:  Betadine Anesthesia (see MAR for exact dosages):    Anesthesia method:  Local infiltration   Local anesthetic:  Lidocaine 2% WITH epi (4 cc) Procedure type:    Complexity:  Complex Procedure details:    Needle aspiration: no     Incision types:  Single straight   Incision depth:  Dermal    Scalpel blade:  11   Wound management:  Probed and deloculated   Drainage:  Bloody   Wound treatment:  Wound left open   Packing materials:  None Post-procedure details:    Patient tolerance of procedure:  Tolerated well, no immediate complications   (including critical care time)  Medications Ordered in ED Medications - No data to display   Initial Impression / Assessment and Plan / ED Course  I have reviewed the triage vital signs and the nursing notes.  Pertinent labs that were available during my care of the patient were reviewed by me and considered in my medical decision making (see chart for details).  Clinical Course   Patient presents with 2 separate complaints. Patient has dysuria without discharge. Plan for Rocephin, RPR and close outpatient follow-up.  Patient has recurrent abscess for almost 2 years. Minimal drainage despite incision in the ER. Plan for doxycycline and follow-up with local surgeon. Patient has no systemic symptoms currently.  Patient's blood pressures elevated he's been noncompliant with his medications. Outpatient follow-up. Results and differential diagnosis were discussed with the patient/parent/guardian. Xrays were independently reviewed by myself.  Close follow up outpatient was discussed, comfortable with the plan.   Medications  lidocaine-EPINEPHrine (XYLOCAINE W/EPI) 2 %-1:200000 (PF) injection (not administered)  povidone-iodine (BETADINE) 10 % external solution (not administered)  cefTRIAXone (ROCEPHIN) injection 250 mg (not administered)  lidocaine-EPINEPHrine (XYLOCAINE W/EPI) 2 %-1:200000 (PF) injection 20 mL (20 mLs Intradermal Given by Other 11/11/15 0948)    Vitals:   11/11/15 0917 11/11/15 0930 11/11/15 0949  BP: (!) 170/119 (!) 152/107   Pulse: 96 72   Resp: 16 16   Temp: 98.8 F (37.1 C)    TempSrc: Oral    SpO2: 100% 100%   Weight:   197 lb (89.4 kg)  Height:   5\' 7"  (1.702 m)    Final diagnoses:  Perirectal abscess    Dysuria     Final Clinical Impressions(s) / ED Diagnoses   Final diagnoses:  None    New Prescriptions New Prescriptions   No medications on file      Blane OharaJoshua Judea Fennimore, MD 11/11/15 1011

## 2015-11-12 LAB — RPR, QUANT+TP ABS (REFLEX)
Rapid Plasma Reagin, Quant: 1:4 {titer} — ABNORMAL HIGH
T Pallidum Abs: POSITIVE — AB

## 2015-11-12 LAB — RPR: RPR Ser Ql: REACTIVE — AB

## 2015-11-13 ENCOUNTER — Telehealth (HOSPITAL_COMMUNITY): Payer: Self-pay

## 2015-11-13 NOTE — Telephone Encounter (Signed)
RPR - reactive T Pallidum - (+) Quant. 1:4  Chart to MD for review.  DhhS form attached

## 2015-11-14 NOTE — Telephone Encounter (Signed)
Post ED Visit - Positive Culture Follow-up: Successful Patient Follow-Up  Culture assessed and recommendations reviewed by: []  Enzo BiNathan Batchelder, Pharm.D. []  Celedonio MiyamotoJeremy Frens, [address]Pharm.D., BCPS []  Garvin FilaMike Maccia, Pharm.D. []  Georgina PillionElizabeth Martin, Pharm.D., BCPS []  SanbornMinh Pham, [place]arm[place]D., BCPS, AAHIVP []  Estella HuskMichelle Turner, Pharm.D., BCPS, AAHIVP []  Tennis Mustassie Stewart, [address] []  Sherle Poeob Vincent, [address]  Positive RPR culture  [x]  Patient discharged without antimicrobial prescription and treatment is now indicated []  Organism is resistant to prescribed ED discharge antimicrobial []  Patient with positive blood cultures  Changes discussed with ED provider: Melene Planan Floyd DO New antibiotic prescription needs to return to ED for Benzathine Penicillin G 2.4 million units IM x 1  Attempting to contact patient to return   Berle MullMiller, Shmiel Morton 11/14/2015, 5:05 PM

## 2015-12-24 ENCOUNTER — Other Ambulatory Visit: Payer: Self-pay

## 2015-12-24 ENCOUNTER — Ambulatory Visit: Payer: Self-pay

## 2015-12-31 ENCOUNTER — Telehealth (HOSPITAL_BASED_OUTPATIENT_CLINIC_OR_DEPARTMENT_OTHER): Payer: Self-pay | Admitting: Emergency Medicine

## 2015-12-31 NOTE — Telephone Encounter (Signed)
Chart appended, lost to followup 

## 2016-10-28 ENCOUNTER — Other Ambulatory Visit: Payer: Self-pay

## 2016-11-29 ENCOUNTER — Emergency Department (HOSPITAL_COMMUNITY)
Admission: EM | Admit: 2016-11-29 | Discharge: 2016-11-29 | Disposition: A | Discharge status: Home / Self Care | Payer: Self-pay | Attending: Emergency Medicine | Admitting: Emergency Medicine

## 2016-11-29 ENCOUNTER — Encounter (HOSPITAL_COMMUNITY): Payer: Self-pay

## 2016-11-29 ENCOUNTER — Emergency Department (HOSPITAL_COMMUNITY): Payer: Self-pay

## 2016-11-29 DIAGNOSIS — J45909 Unspecified asthma, uncomplicated: Secondary | ICD-10-CM | POA: Insufficient documentation

## 2016-11-29 DIAGNOSIS — B20 Human immunodeficiency virus [HIV] disease: Secondary | ICD-10-CM | POA: Insufficient documentation

## 2016-11-29 DIAGNOSIS — K5900 Constipation, unspecified: Secondary | ICD-10-CM

## 2016-11-29 DIAGNOSIS — F17210 Nicotine dependence, cigarettes, uncomplicated: Secondary | ICD-10-CM | POA: Insufficient documentation

## 2016-11-29 DIAGNOSIS — N390 Urinary tract infection, site not specified: Secondary | ICD-10-CM

## 2016-11-29 DIAGNOSIS — I10 Essential (primary) hypertension: Secondary | ICD-10-CM

## 2016-11-29 DIAGNOSIS — J069 Acute upper respiratory infection, unspecified: Secondary | ICD-10-CM

## 2016-11-29 DIAGNOSIS — Z79899 Other long term (current) drug therapy: Secondary | ICD-10-CM | POA: Insufficient documentation

## 2016-11-29 LAB — URINALYSIS, ROUTINE W REFLEX MICROSCOPIC
Bilirubin Urine: NEGATIVE
Glucose, UA: NEGATIVE mg/dL
KETONES UR: NEGATIVE mg/dL
Nitrite: NEGATIVE
PROTEIN: 30 mg/dL — AB
Specific Gravity, Urine: 1.02 (ref 1.005–1.030)
pH: 6.5 (ref 5.0–8.0)

## 2016-11-29 LAB — CBC WITH DIFFERENTIAL/PLATELET
Basophils Absolute: 0 10*3/uL (ref 0.0–0.1)
Basophils Relative: 0 %
EOS ABS: 0.2 10*3/uL (ref 0.0–0.7)
Eosinophils Relative: 4 %
HCT: 32.8 % — ABNORMAL LOW (ref 39.0–52.0)
HEMOGLOBIN: 11.5 g/dL — AB (ref 13.0–17.0)
Lymphocytes Relative: 29 %
Lymphs Abs: 1.5 10*3/uL (ref 0.7–4.0)
MCH: 28.7 pg (ref 26.0–34.0)
MCHC: 35.1 g/dL (ref 30.0–36.0)
MCV: 81.8 fL (ref 78.0–100.0)
MONO ABS: 0.5 10*3/uL (ref 0.1–1.0)
MONOS PCT: 10 %
NEUTROS PCT: 57 %
Neutro Abs: 2.9 10*3/uL (ref 1.7–7.7)
Platelets: 256 10*3/uL (ref 150–400)
RBC: 4.01 MIL/uL — ABNORMAL LOW (ref 4.22–5.81)
RDW: 13.8 % (ref 11.5–15.5)
WBC: 5.1 10*3/uL (ref 4.0–10.5)

## 2016-11-29 LAB — COMPREHENSIVE METABOLIC PANEL
ALK PHOS: 59 U/L (ref 38–126)
ALT: 17 U/L (ref 17–63)
ANION GAP: 8 (ref 5–15)
AST: 24 U/L (ref 15–41)
Albumin: 3.3 g/dL — ABNORMAL LOW (ref 3.5–5.0)
BILIRUBIN TOTAL: 0.3 mg/dL (ref 0.3–1.2)
BUN: 8 mg/dL (ref 6–20)
CALCIUM: 8.7 mg/dL — AB (ref 8.9–10.3)
CO2: 25 mmol/L (ref 22–32)
Chloride: 102 mmol/L (ref 101–111)
Creatinine, Ser: 1.06 mg/dL (ref 0.61–1.24)
GFR calc non Af Amer: >60 mL/min (ref >60)
Glucose, Bld: 117 mg/dL — ABNORMAL HIGH (ref 65–99)
Potassium: 3.3 mmol/L — ABNORMAL LOW (ref 3.5–5.1)
SODIUM: 135 mmol/L (ref 135–145)
TOTAL PROTEIN: 10 g/dL — AB (ref 6.5–8.1)

## 2016-11-29 LAB — URINALYSIS, MICROSCOPIC (REFLEX): Squamous Epithelial / LPF: NONE SEEN

## 2016-11-29 LAB — LIPASE, BLOOD: LIPASE: 30 U/L (ref 11–51)

## 2016-11-29 LAB — TROPONIN I: Troponin I: <0.03 ng/mL (ref <0.03)

## 2016-11-29 MED ORDER — MOMETASONE FUROATE 50 MCG/ACT NA SUSP: 2.0000 | Freq: Every day | NASAL | 12 refills | Status: DC

## 2016-11-29 MED ORDER — POLYETHYLENE GLYCOL 3350 17 G PO PACK: 17.0000 g | PACK | Freq: Every day | ORAL | 0 refills | Status: DC

## 2016-11-29 MED ORDER — LEVOFLOXACIN 750 MG PO TABS: 750.0000 mg | ORAL_TABLET | Freq: Every day | ORAL | 0 refills | Status: DC

## 2016-11-29 MED ORDER — ALBUTEROL SULFATE (2.5 MG/3ML) 0.083% IN NEBU
5.0000 mg | INHALATION_SOLUTION | Freq: Once | RESPIRATORY_TRACT | Status: AC
Start: 2016-11-29 — End: 2016-11-29
  Administered 2016-11-29: 5 mg via RESPIRATORY_TRACT
  Filled 2016-11-29: qty 6

## 2016-11-29 MED ORDER — ALBUTEROL SULFATE (2.5 MG/3ML) 0.083% IN NEBU: 2.5000 mg | INHALATION_SOLUTION | RESPIRATORY_TRACT | Status: DC | PRN

## 2016-11-29 MED ORDER — LORATADINE 10 MG PO TABS
10.0000 mg | ORAL_TABLET | Freq: Once | ORAL | Status: AC
  Administered 2016-11-29: 10 mg via ORAL
  Filled 2016-11-29: qty 1

## 2016-11-29 MED ORDER — ALBUTEROL SULFATE HFA 108 (90 BASE) MCG/ACT IN AERS
1.0000 | INHALATION_SPRAY | RESPIRATORY_TRACT | Status: DC | PRN
  Filled 2016-11-29: qty 6.7

## 2016-11-29 MED ORDER — LORATADINE 10 MG PO TABS: 10.0000 mg | ORAL_TABLET | Freq: Every day | ORAL | 0 refills | Status: DC

## 2016-11-29 NOTE — ED Provider Notes (Signed)
[facility] Provider Note   CSN: [id number] Arrival date & time: 11/29/16  0744     History   Chief Complaint Chief Complaint  Patient presents with  . Cough  . Abdominal Pain    HPI CELVIN Morales is a 31 y.o. male.  HPI Patient presents with several complaints. States he's having nasal congestion and frontal headache starting yesterday. No neck pain or stiffness. Also complains of anterior chest pain which is worse with coughing. He's had a cough which was nonproductive and some shortness of breath. Patient also complains of abdominal pain starting yesterday. Associated with nausea and as if he needs to have a bowel movement but unable to. He has some urinary hesitancy as well but denies dysuria. No fever or chills. Past Medical History:  Diagnosis Date  . Asthma   . HIV (human immunodeficiency virus infection) (HCC)   . Hypertension     Patient Active Problem List   Diagnosis Date Noted  . Vitamin D deficiency 05/27/2014  . 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 Essential hypertension 12/25/2013  . Tobacco dependence 12/25/2013  . History of MRSA infection 02/16/2013  . Essential hypertension, benign 02/16/2013  . History of syphilis 02/16/2013  . Human immunodeficiency virus (HIV) disease (HCC) 10/24/2012  . Anemia 10/23/2012  . Gluteal abscess 10/22/2012    History reviewed. No pertinent surgical history.     Home Medications    Prior to Admission medications   Medication Sig Start Date End Date Taking? Authorizing Provider  GENVOYA 150-150-200-10 MG TABS tablet TAKE 1 TABLET BY MOUTH DAILY WITH BREAKFAST 08/06/15  Yes Comer, Belia Heman, MD  ibuprofen (ADVIL,MOTRIN) 200 MG tablet Take 400-600 mg by mouth every 8 (eight) hours as needed for moderate pain.   Yes [provider]  amLODipine (NORVASC) 5 MG tablet TAKE 1 TABLET BY MOUTH EVERY DAY Patient not taking: Reported on 11/11/2015 08/06/15   Gardiner Barefoot, MD  diphenhydramine-acetaminophen (TYLENOL  PM) 25-500 MG TABS Take 2 tablets by mouth at bedtime as needed.    [provider]  doxycycline (VIBRAMYCIN) 100 MG capsule Take 1 capsule (100 mg total) by mouth 2 (two) times daily. Patient not taking: Reported on 11/29/2016 11/11/15   Blane Ohara, MD  hydrochlorothiazide (HYDRODIURIL) 12.5 MG tablet Take 1 tablet (12.5 mg total) by mouth daily. Patient not taking: Reported on 11/11/2015 04/01/14   Altha Harm, MD  levofloxacin (LEVAQUIN) 750 MG tablet Take 1 tablet (750 mg total) by mouth daily. X 7 days 11/29/16   Loren Racer, MD  loratadine (CLARITIN) 10 MG tablet Take 1 tablet (10 mg total) by mouth daily. 11/29/16   Loren Racer, MD  mometasone (NASONEX) 50 MCG/ACT nasal spray Place 2 sprays into the nose daily. 11/29/16   Loren Racer, MD  polyethylene glycol [facility] Ethelene Hal) packet Take 17 g by mouth daily. 11/29/16   Loren Racer, MD    Family History Family History  Problem Relation Age of Onset  . Kidney disease Neg Hx     Social History Social History  Substance Use Topics  . Smoking status: Current Some Day Smoker    Packs/day: 0.10    Types: Cigarettes    Start date: 03/03/2013  . Smokeless tobacco: Never Used  . Alcohol use No     Allergies   Peanut-containing drug products   Review of Systems Review of Systems  Constitutional: Negative for chills and fever.  HENT: Positive for congestion and sinus pressure. Negative for sore throat, trouble  swallowing and voice change.   Eyes: Negative for photophobia and visual disturbance.  Respiratory: Positive for cough and shortness of breath.   Cardiovascular: Positive for chest pain. Negative for palpitations and leg swelling.  Gastrointestinal: Positive for abdominal pain, constipation and nausea. Negative for blood in stool, diarrhea and vomiting.  Genitourinary: Positive for difficulty urinating. Negative for dysuria, flank pain, frequency, hematuria, penile pain, scrotal swelling  and testicular pain.  Musculoskeletal: Negative for back pain, myalgias, neck pain and neck stiffness.  Skin: Negative for rash and wound.  Neurological: Positive for headaches. Negative for dizziness, weakness, light-headedness and numbness.  All other systems reviewed and are negative.    Physical Exam Updated Vital Signs BP (!) 161/110   Pulse 76   Temp 98.3 F (36.8 C) (Oral)   Resp 16   Ht  (1.702 m)   Wt 89.8 kg (198 lb)   SpO2 98%   BMI 31.01 kg/m   Physical Exam  Constitutional: He is oriented to person, place, and time. He appears well-developed and well-nourished. No distress.  HENT:  Head: Normocephalic and atraumatic.  Mouth/Throat: Oropharynx is clear and moist. No oropharyngeal exudate.  Bilateral nasal mucosal edema left greater than right. No definite sinus tenderness to percussion. Oropharynx is clear.  Eyes: Pupils are equal, round, and reactive to light. EOM are normal.  Neck: Normal range of motion. Neck supple. No JVD present.  No meningismus  Cardiovascular: Normal rate and regular rhythm.  Exam reveals no gallop and no friction rub.   No murmur heard. Pulmonary/Chest: Effort normal and breath sounds normal.  Abdominal: Soft. Bowel sounds are normal. There is tenderness. There is no rebound and no guarding.  Mild diffuse abdominal tenderness to palpation. No rebound or guarding.  Musculoskeletal: Normal range of motion. He exhibits no edema or tenderness.  No CVA tenderness bilaterally. No lower extremity swelling, tenderness or asymmetry. Digital clubbing noted to bilateral hands.  Lymphadenopathy:    He has no cervical adenopathy.  Neurological: He is alert and oriented to person, place, and time.  Moving all extremities without focal deficit. Sensation fully intact.  Skin: Skin is warm and dry. Capillary refill takes less than 2 seconds. No rash noted. No erythema.  Psychiatric: He has a normal mood and affect. His behavior is normal.  Nursing  note and vitals reviewed.    ED Treatments / Results  Labs (all labs ordered are listed, but only abnormal results are displayed) Labs Reviewed  CBC WITH DIFFERENTIAL/PLATELET - Abnormal; Notable for the following:       Result Value   RBC 4.01 (*)    Hemoglobin 11.5 (*)    HCT 32.8 (*)    All other components within normal limits  COMPREHENSIVE METABOLIC PANEL - Abnormal; Notable for the following:    Potassium 3.3 (*)    Glucose, Bld 117 (*)    Calcium 8.7 (*)    Total Protein 10.0 (*)    Albumin 3.3 (*)    All other components within normal limits  URINALYSIS, ROUTINE W REFLEX MICROSCOPIC - Abnormal; Notable for the following:    APPearance HAZY (*)    Hgb urine dipstick LARGE (*)    Protein, ur 30 (*)    Leukocytes, UA SMALL (*)    All other components within normal limits  URINALYSIS, MICROSCOPIC (REFLEX) - Abnormal; Notable for the following:    Bacteria, UA MANY (*)    All other components within normal limits  URINE CULTURE  TROPONIN I  LIPASE, BLOOD  GC/CHLAMYDIA PROBE AMP ([place]) NOT AT [facility]    EKG  EKG Interpretation None       Radiology Dg Abd Acute W/chest  Result Date: 11/29/2016 CLINICAL DATA:  Generalized abdominal pain, constipation EXAM: DG ABDOMEN ACUTE W/ 1V CHEST COMPARISON:  Chest x-ray 06/24/2013 FINDINGS: Heart is normal size. Lungs are clear. No effusions. No acute bony abnormality. Nonobstructive bowel gas pattern. No free air organomegaly. No suspicious calcification. IMPRESSION: No evidence of bowel obstruction or free air. No acute cardiopulmonary disease. Electronically Signed   By: Charlett Nose M.D.   On: 11/29/2016 09:04    Procedures Procedures (including critical care time)  Medications Ordered in ED Medications  albuterol (PROVENTIL) (2.5 MG/3ML) 0.083% nebulizer solution 5 mg (5 mg Nebulization Given 11/29/16 0938)  loratadine (CLARITIN) tablet 10 mg (10 mg Oral Given 11/29/16 0981)     Initial Impression / Assessment  and Plan / ED Course  I have reviewed the triage vital signs and the nursing notes.  Pertinent labs & imaging results that were available during my care of the patient were reviewed by me and considered in my medical decision making (see chart for details).     Patient with likely URI/sinusitis. States he is feeling better after breathing treatment. No evidence of pneumonia. Abdominal x-ray with stool throughout. No evidence of obstruction. Patient with UA consistent with UTI. Sent for urine culture. Also sent for [facility] culture. Will start on antibiotics to cover for respiratory and urinary tract infection. Return precautions given.  Final Clinical Impressions(s) / ED Diagnoses   Final diagnoses:  Upper respiratory tract infection, unspecified type  Constipation, unspecified constipation type  Acute lower UTI    New Prescriptions Discharge Medication List as of 11/29/2016 11:48 AM    START taking these medications   Details  levofloxacin (LEVAQUIN) 750 MG tablet Take 1 tablet (750 mg total) by mouth daily. X 7 days, Starting Mon 11/29/2016, Print    loratadine (CLARITIN) 10 MG tablet Take 1 tablet (10 mg total) by mouth daily., Starting Mon 11/29/2016, Print    mometasone (NASONEX) 50 MCG/ACT nasal spray Place 2 sprays into the nose daily., Starting Mon 11/29/2016, Print    polyethylene glycol (MIRALAX / GLYCOLAX) packet Take 17 g by mouth daily., Starting Mon 11/29/2016, Print         Loren Racer, MD 11/29/16 [phone]

## 2016-11-29 NOTE — ED Triage Notes (Signed)
Patient reports of cough, congestion and abdominal pain. States he feels like he needs to have BM but unable to. Also reports of chest sore from coughing.

## 2016-12-01 LAB — URINE CULTURE: Culture: <10000 — AB

## 2017-01-26 ENCOUNTER — Ambulatory Visit: Payer: Self-pay | Admitting: Infectious Diseases

## 2017-05-20 ENCOUNTER — Ambulatory Visit: Payer: Self-pay

## 2017-05-20 ENCOUNTER — Other Ambulatory Visit: Payer: Self-pay

## 2017-05-31 ENCOUNTER — Ambulatory Visit: Payer: Self-pay | Admitting: Internal Medicine

## 2017-06-02 ENCOUNTER — Ambulatory Visit: Payer: Self-pay | Admitting: Internal Medicine

## 2017-07-09 ENCOUNTER — Other Ambulatory Visit: Payer: Self-pay

## 2017-07-09 ENCOUNTER — Encounter (HOSPITAL_COMMUNITY): Payer: Self-pay | Admitting: Emergency Medicine

## 2017-07-09 ENCOUNTER — Emergency Department (HOSPITAL_COMMUNITY)
Admission: EM | Admit: 2017-07-09 | Discharge: 2017-07-09 | Disposition: A | Discharge status: Home / Self Care | Payer: Self-pay | Attending: Emergency Medicine | Admitting: Emergency Medicine

## 2017-07-09 DIAGNOSIS — Z79899 Other long term (current) drug therapy: Secondary | ICD-10-CM | POA: Insufficient documentation

## 2017-07-09 DIAGNOSIS — F17210 Nicotine dependence, cigarettes, uncomplicated: Secondary | ICD-10-CM | POA: Insufficient documentation

## 2017-07-09 DIAGNOSIS — Z91010 Allergy to peanuts: Secondary | ICD-10-CM | POA: Insufficient documentation

## 2017-07-09 DIAGNOSIS — I10 Essential (primary) hypertension: Secondary | ICD-10-CM | POA: Insufficient documentation

## 2017-07-09 DIAGNOSIS — B20 Human immunodeficiency virus [HIV] disease: Secondary | ICD-10-CM | POA: Insufficient documentation

## 2017-07-09 DIAGNOSIS — J45909 Unspecified asthma, uncomplicated: Secondary | ICD-10-CM | POA: Insufficient documentation

## 2017-07-09 MED ORDER — AMLODIPINE BESYLATE 5 MG PO TABS: 5.0000 mg | ORAL_TABLET | Freq: Every day | ORAL | 2 refills | Status: DC

## 2017-07-09 MED ORDER — SPIRONOLACTONE-HCTZ 25-25 MG PO TABS: 1.0000 | ORAL_TABLET | Freq: Every day | ORAL | Status: DC

## 2017-07-09 MED ORDER — HYDROCODONE-ACETAMINOPHEN 5-325 MG PO TABS
2.0000 | ORAL_TABLET | Freq: Once | ORAL | Status: AC
  Administered 2017-07-09: 2 via ORAL
  Filled 2017-07-09: qty 2

## 2017-07-09 MED ORDER — SPIRONOLACTONE 25 MG PO TABS
25.0000 mg | ORAL_TABLET | Freq: Every day | ORAL | Status: DC
  Filled 2017-07-09 (×2): qty 1

## 2017-07-09 MED ORDER — HYDROCHLOROTHIAZIDE 12.5 MG PO TABS: 12.5000 mg | ORAL_TABLET | Freq: Every day | ORAL | 1 refills | Status: DC

## 2017-07-09 MED ORDER — AMLODIPINE BESYLATE 5 MG PO TABS
5.0000 mg | ORAL_TABLET | Freq: Once | ORAL | Status: AC
Start: 2017-07-09 — End: 2017-07-09
  Administered 2017-07-09: 5 mg via ORAL
  Filled 2017-07-09: qty 1

## 2017-07-09 MED ORDER — HYDROCHLOROTHIAZIDE 25 MG PO TABS
25.0000 mg | ORAL_TABLET | Freq: Every day | ORAL | Status: DC
  Administered 2017-07-09: 25 mg via ORAL
  Filled 2017-07-09: qty 1

## 2017-07-09 NOTE — ED Notes (Signed)
Call to AC re: meds 

## 2017-07-09 NOTE — ED Provider Notes (Signed)
[facility] EMERGENCY DEPARTMENT Provider Note   CSN: [id number] Arrival date & time: 07/09/17  1849     History   Chief Complaint Chief Complaint  Patient presents with  . Headache    HPI Phillip Morales is a 32 y.o. male.  He presents for evaluation of headache which started today.  He thinks it is likely related to not being on blood pressure medicines for about a year.  He has been unable to see a primary care doctor or his infectious disease doctor to get medications.  He denies fever, chills, nausea, vomiting, cough, shortness of breath, chest pain, focal weakness or paresthesia.  There are no other known modifying factors.  HPI  Past Medical History:  Diagnosis Date  . Asthma   . HIV (human immunodeficiency virus infection) (HCC)   . Hypertension     Patient Active Problem List   Diagnosis Date Noted  . Vitamin D deficiency 05/27/2014  . 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 Essential hypertension 12/25/2013  . Tobacco dependence 12/25/2013  . History of MRSA infection 02/16/2013  . Essential hypertension, benign 02/16/2013  . History of syphilis 02/16/2013  . Human immunodeficiency virus (HIV) disease (HCC) 10/24/2012  . Anemia 10/23/2012  . Gluteal abscess 10/22/2012    History reviewed. No pertinent surgical history.      Home Medications    Prior to Admission medications   Medication Sig Start Date End Date Taking? Authorizing Provider  loratadine (CLARITIN) 10 MG tablet Take 1 tablet (10 mg total) by mouth daily. Patient taking differently: Take 10 mg by mouth daily as needed for allergies.  11/29/16  Yes Loren Racer, MD  amLODipine (NORVASC) 5 MG tablet Take 1 tablet (5 mg total) by mouth daily. 07/09/17   Mancel Bale, MD  GENVOYA 150-150-200-10 MG TABS tablet TAKE 1 TABLET BY MOUTH DAILY WITH BREAKFAST Patient not taking: Reported on 07/09/2017 08/06/15   Gardiner Barefoot, MD  hydrochlorothiazide (HYDRODIURIL) 12.5 MG tablet Take 1 tablet (12.5 mg  total) by mouth daily. 07/09/17   Mancel Bale, MD  mometasone (NASONEX) 50 MCG/ACT nasal spray Place 2 sprays into the nose daily. Patient not taking: Reported on 07/09/2017 11/29/16   Loren Racer, MD    Family History Family History  Problem Relation Age of Onset  . Kidney disease Neg Hx     Social History Social History   Tobacco Use  . Smoking status: Current Some Day Smoker    Packs/day: 0.10    Types: Cigarettes    Start date: 03/03/2013  . Smokeless tobacco: Never Used  Substance Use Topics  . Alcohol use: Yes    Alcohol/week: 0.0 oz    Comment: social  . Drug use: Yes    Types: Marijuana    Comment: 2 days ago     Allergies   Peanut-containing drug products   Review of Systems Review of Systems  All other systems reviewed and are negative.    Physical Exam Updated Vital Signs BP (!) 157/104 (BP Location: Left Arm)   Pulse 87   Temp (!) 87 F (30.6 C)   Resp 16   Ht  (1.702 m)   Wt 89.8 kg (198 lb)   SpO2 100%   BMI 31.01 kg/m   Physical Exam  Constitutional: He is oriented to person, place, and time. He appears well-developed and well-nourished. He does not appear ill.  HENT:  Head: Normocephalic and atraumatic.  Right Ear: External ear normal.  Left Ear: External ear  normal.  Eyes: Pupils are equal, round, and reactive to light. Conjunctivae and EOM are normal.  Neck: Normal range of motion and phonation normal. Neck supple. No neck rigidity. No tracheal deviation present.  Cardiovascular: Normal rate, regular rhythm and normal heart sounds.  Pulmonary/Chest: Effort normal and breath sounds normal. He exhibits no bony tenderness.  Abdominal: Soft. There is no tenderness.  Musculoskeletal: Normal range of motion.  Normal strength arms and legs bilaterally.  Neurological: He is alert and oriented to person, place, and time. No cranial nerve deficit or sensory deficit. He exhibits normal muscle tone. Coordination normal.  No  dysarthria, aphasia or nystagmus.  Skin: Skin is warm, dry and intact.  Psychiatric: He has a normal mood and affect. His behavior is normal. Judgment and thought content normal.  Nursing note and vitals reviewed.    ED Treatments / Results  Labs (all labs ordered are listed, but only abnormal results are displayed) Labs Reviewed - No data to display  EKG None  Radiology No results found.  Procedures Procedures (including critical care time)  Medications Ordered in ED Medications  hydrochlorothiazide (HYDRODIURIL) tablet 25 mg (25 mg Oral Given 07/09/17 2146)  amLODipine (NORVASC) tablet 5 mg (5 mg Oral Given 07/09/17 2115)  HYDROcodone-acetaminophen (NORCO/VICODIN) 5-325 MG per tablet 2 tablet (2 tablets Oral Given 07/09/17 2115)     Initial Impression / Assessment and Plan / ED Course  I have reviewed the triage vital signs and the nursing notes.  Pertinent labs & imaging results that were available during my care of the patient were reviewed by me and considered in my medical decision making (see chart for details).      Patient Vitals for the past 24 hrs:  BP Temp Temp src Pulse Resp SpO2 Height Weight  07/09/17 2147 (!) 157/104 - - 87 - 100 % - -  07/09/17 2114 (!) 146/104 (!) 87 F (30.6 C) - 87 16 97 % - -  07/09/17 1938 (!) 166/109 98.6 F (37 C) Oral (!) 106 20 100 %  (1.702 m) 89.8 kg (198 lb)    9:32 PM Reevaluation with update and discussion. After initial assessment and treatment, an updated evaluation reveals no change in clinical status.  Repeat blood pressure mildly elevated.  Findings discussed and questions answered. Mancel Bale   Nursing Notes Reviewed/ Care Coordinated Applicable Imaging Reviewed Interpretation of Laboratory Data incorporated into ED treatment  The patient appears reasonably screened and/or stabilized for discharge and I doubt any other medical condition or other [facility] requiring further screening, evaluation, or treatment in  the ED at this time prior to discharge.  Plan: Home Medications-restart usual home medications as soon as possible; Home Treatments-rest, fluids; return here if the recommended treatment, does not improve the symptoms; Recommended follow up-PCP follow-up as soon as possible      Final Clinical Impressions(s) / ED Diagnoses   Final diagnoses:  Hypertension, unspecified type    ED Discharge Orders        Ordered    amLODipine (NORVASC) 5 MG tablet  Daily    Note to Pharmacy:  The patient needs to make and keep MD and Lab work appointments.  He also needs to renew his ADAP now.  Thank you.   07/09/17 2157    hydrochlorothiazide (HYDRODIURIL) 12.5 MG tablet  Daily     07/09/17 2157       Mancel Bale, MD 07/09/17 2159

## 2017-07-09 NOTE — ED Triage Notes (Signed)
Patient reports waking with a headache this am, has had n/v.

## 2017-07-09 NOTE — Discharge Instructions (Addendum)
Follow-up with a doctor as soon as possible for further treatment of your blood pressure.  Try to stay on a low-salt diet, get plenty of rest and some exercise.

## 2017-07-09 NOTE — ED Notes (Signed)
Pt reports eating a [employer] hamburger last night This am N/V x 3 last emesis at 1100  Also D x 4 this am  Now with abd pain  Pt does not mention HA

## 2017-08-10 ENCOUNTER — Other Ambulatory Visit: Payer: Self-pay | Admitting: *Deleted

## 2017-08-10 ENCOUNTER — Other Ambulatory Visit: Payer: Self-pay

## 2017-08-10 DIAGNOSIS — Z79899 Other long term (current) drug therapy: Secondary | ICD-10-CM

## 2017-08-10 DIAGNOSIS — B20 Human immunodeficiency virus [HIV] disease: Secondary | ICD-10-CM

## 2017-08-10 DIAGNOSIS — Z113 Encounter for screening for infections with a predominantly sexual mode of transmission: Secondary | ICD-10-CM

## 2017-08-29 ENCOUNTER — Encounter: Payer: Self-pay | Admitting: Internal Medicine

## 2017-10-15 ENCOUNTER — Inpatient Hospital Stay (HOSPITAL_COMMUNITY)
Admission: EM | Admit: 2017-10-15 | Discharge: 2017-10-17 | DRG: 394 | Disposition: A | Discharge status: Home / Self Care | Payer: Self-pay | Attending: Internal Medicine | Admitting: Internal Medicine

## 2017-10-15 ENCOUNTER — Other Ambulatory Visit: Payer: Self-pay

## 2017-10-15 ENCOUNTER — Emergency Department (HOSPITAL_COMMUNITY): Payer: Self-pay

## 2017-10-15 ENCOUNTER — Encounter (HOSPITAL_COMMUNITY): Payer: Self-pay | Admitting: Emergency Medicine

## 2017-10-15 DIAGNOSIS — J45909 Unspecified asthma, uncomplicated: Secondary | ICD-10-CM | POA: Diagnosis present

## 2017-10-15 DIAGNOSIS — K611 Rectal abscess: Principal | ICD-10-CM | POA: Diagnosis present

## 2017-10-15 DIAGNOSIS — Z91010 Allergy to peanuts: Secondary | ICD-10-CM

## 2017-10-15 DIAGNOSIS — E876 Hypokalemia: Secondary | ICD-10-CM | POA: Diagnosis present

## 2017-10-15 DIAGNOSIS — B20 Human immunodeficiency virus [HIV] disease: Secondary | ICD-10-CM | POA: Diagnosis present

## 2017-10-15 DIAGNOSIS — I10 Essential (primary) hypertension: Secondary | ICD-10-CM | POA: Diagnosis present

## 2017-10-15 DIAGNOSIS — F17210 Nicotine dependence, cigarettes, uncomplicated: Secondary | ICD-10-CM | POA: Diagnosis present

## 2017-10-15 DIAGNOSIS — T502X5A Adverse effect of carbonic-anhydrase inhibitors, benzothiadiazides and other diuretics, initial encounter: Secondary | ICD-10-CM | POA: Diagnosis present

## 2017-10-15 DIAGNOSIS — F17200 Nicotine dependence, unspecified, uncomplicated: Secondary | ICD-10-CM | POA: Diagnosis present

## 2017-10-15 DIAGNOSIS — L0231 Cutaneous abscess of buttock: Secondary | ICD-10-CM | POA: Diagnosis present

## 2017-10-15 DIAGNOSIS — E871 Hypo-osmolality and hyponatremia: Secondary | ICD-10-CM | POA: Diagnosis present

## 2017-10-15 LAB — BASIC METABOLIC PANEL
ANION GAP: 8 (ref 5–15)
BUN: 8 mg/dL (ref 6–20)
CALCIUM: 8.6 mg/dL — AB (ref 8.9–10.3)
CO2: 26 mmol/L (ref 22–32)
Chloride: 98 mmol/L (ref 98–111)
Creatinine, Ser: 1.24 mg/dL (ref 0.61–1.24)
Glucose, Bld: 100 mg/dL — ABNORMAL HIGH (ref 70–99)
POTASSIUM: 3.3 mmol/L — AB (ref 3.5–5.1)
Sodium: 132 mmol/L — ABNORMAL LOW (ref 135–145)

## 2017-10-15 LAB — CBC WITH DIFFERENTIAL/PLATELET
BASOS ABS: 0 10*3/uL (ref 0.0–0.1)
BASOS PCT: 0 %
Eosinophils Absolute: 0 10*3/uL (ref 0.0–0.7)
Eosinophils Relative: 0 %
HEMATOCRIT: 33 % — AB (ref 39.0–52.0)
HEMOGLOBIN: 11.7 g/dL — AB (ref 13.0–17.0)
LYMPHS PCT: 15 %
Lymphs Abs: 1.3 10*3/uL (ref 0.7–4.0)
MCH: 29.8 pg (ref 26.0–34.0)
MCHC: 35.5 g/dL (ref 30.0–36.0)
MCV: 84 fL (ref 78.0–100.0)
Monocytes Absolute: 0.8 10*3/uL (ref 0.1–1.0)
Monocytes Relative: 9 %
NEUTROS PCT: 76 %
Neutro Abs: 6.6 10*3/uL (ref 1.7–7.7)
Platelets: 193 10*3/uL (ref 150–400)
RBC: 3.93 MIL/uL — AB (ref 4.22–5.81)
RDW: 13.2 % (ref 11.5–15.5)
WBC: 8.7 10*3/uL (ref 4.0–10.5)

## 2017-10-15 LAB — SURGICAL PCR SCREEN
MRSA, PCR: NEGATIVE
STAPHYLOCOCCUS AUREUS: POSITIVE — AB

## 2017-10-15 MED ORDER — POTASSIUM CHLORIDE CRYS ER 20 MEQ PO TBCR
40.0000 meq | EXTENDED_RELEASE_TABLET | Freq: Once | ORAL | Status: AC
  Administered 2017-10-15: 40 meq via ORAL
  Filled 2017-10-15: qty 2

## 2017-10-15 MED ORDER — ONDANSETRON HCL 4 MG/2ML IJ SOLN
4.0000 mg | Freq: Four times a day (QID) | INTRAMUSCULAR | Status: DC | PRN
  Administered 2017-10-15: 4 mg via INTRAVENOUS
  Filled 2017-10-15: qty 2

## 2017-10-15 MED ORDER — MORPHINE SULFATE (PF) 4 MG/ML IV SOLN
4.0000 mg | INTRAVENOUS | Status: DC | PRN
  Administered 2017-10-15 – 2017-10-16 (×6): 4 mg via INTRAVENOUS
  Filled 2017-10-15 (×6): qty 1

## 2017-10-15 MED ORDER — ENOXAPARIN SODIUM 40 MG/0.4ML ~~LOC~~ SOLN
40.0000 mg | SUBCUTANEOUS | Status: DC
  Administered 2017-10-15 – 2017-10-17 (×3): 40 mg via SUBCUTANEOUS
  Filled 2017-10-15 (×3): qty 0.4

## 2017-10-15 MED ORDER — DIPHENHYDRAMINE HCL 25 MG PO CAPS
25.0000 mg | ORAL_CAPSULE | Freq: Once | ORAL | Status: AC
  Administered 2017-10-15: 25 mg via ORAL
  Filled 2017-10-15: qty 1

## 2017-10-15 MED ORDER — ACETAMINOPHEN 650 MG RE SUPP: 650.0000 mg | Freq: Four times a day (QID) | RECTAL | Status: DC | PRN

## 2017-10-15 MED ORDER — POTASSIUM CHLORIDE IN NACL 20-0.9 MEQ/L-% IV SOLN
INTRAVENOUS | Status: AC
  Administered 2017-10-15 – 2017-10-16 (×3): via INTRAVENOUS

## 2017-10-15 MED ORDER — IOPAMIDOL (ISOVUE-300) INJECTION 61%
100.0000 mL | Freq: Once | INTRAVENOUS | Status: AC | PRN
  Administered 2017-10-15: 100 mL via INTRAVENOUS

## 2017-10-15 MED ORDER — CIPROFLOXACIN IN D5W 400 MG/200ML IV SOLN
400.0000 mg | Freq: Once | INTRAVENOUS | Status: AC
  Administered 2017-10-15: 400 mg via INTRAVENOUS
  Filled 2017-10-15: qty 200

## 2017-10-15 MED ORDER — METRONIDAZOLE IN NACL 5-0.79 MG/ML-% IV SOLN: 500.0000 mg | Freq: Once | INTRAVENOUS | Status: DC

## 2017-10-15 MED ORDER — MORPHINE SULFATE (PF) 4 MG/ML IV SOLN
4.0000 mg | Freq: Once | INTRAVENOUS | Status: AC
  Administered 2017-10-15: 4 mg via INTRAVENOUS
  Filled 2017-10-15: qty 1

## 2017-10-15 MED ORDER — VANCOMYCIN HCL 10 G IV SOLR
1750.0000 mg | Freq: Once | INTRAVENOUS | Status: AC
  Administered 2017-10-15: 1750 mg via INTRAVENOUS
  Filled 2017-10-15: qty 1750

## 2017-10-15 MED ORDER — SODIUM CHLORIDE 0.9 % IV SOLN
3.0000 g | Freq: Four times a day (QID) | INTRAVENOUS | Status: DC
  Administered 2017-10-15 – 2017-10-17 (×10): 3 g via INTRAVENOUS
  Filled 2017-10-15 (×13): qty 3

## 2017-10-15 MED ORDER — ONDANSETRON HCL 4 MG PO TABS: 4.0000 mg | ORAL_TABLET | Freq: Four times a day (QID) | ORAL | Status: DC | PRN

## 2017-10-15 MED ORDER — MORPHINE SULFATE (PF) 4 MG/ML IV SOLN
INTRAVENOUS | Status: AC
  Administered 2017-10-15: 4 mg via INTRAVENOUS
  Filled 2017-10-15: qty 1

## 2017-10-15 MED ORDER — AMLODIPINE BESYLATE 5 MG PO TABS
5.0000 mg | ORAL_TABLET | Freq: Every day | ORAL | Status: DC
  Administered 2017-10-15 – 2017-10-17 (×3): 5 mg via ORAL
  Filled 2017-10-15 (×3): qty 1

## 2017-10-15 MED ORDER — ACETAMINOPHEN 325 MG PO TABS
650.0000 mg | ORAL_TABLET | Freq: Four times a day (QID) | ORAL | Status: DC | PRN
  Administered 2017-10-15 (×2): 650 mg via ORAL
  Filled 2017-10-15 (×2): qty 2

## 2017-10-15 MED ORDER — MORPHINE SULFATE (PF) 4 MG/ML IV SOLN
4.0000 mg | Freq: Once | INTRAVENOUS | Status: AC
  Administered 2017-10-15: 4 mg via INTRAVENOUS

## 2017-10-15 MED ORDER — VANCOMYCIN HCL IN DEXTROSE 1-5 GM/200ML-% IV SOLN
1000.0000 mg | Freq: Three times a day (TID) | INTRAVENOUS | Status: DC
  Administered 2017-10-15 – 2017-10-16 (×4): 1000 mg via INTRAVENOUS
  Filled 2017-10-15 (×4): qty 200

## 2017-10-15 NOTE — ED Triage Notes (Signed)
Patient complaining of an abscess to his left buttocks area. Patient states that he has had the abscess x 3 days.

## 2017-10-15 NOTE — Progress Notes (Signed)
Pharmacy Antibiotic Note  Phillip Morales is a 32 y.o. male admitted on 10/15/2017 with wound infection.  Pharmacy has been consulted for Vancomycin dosing.  Plan: Vancomycin 1750 mg IV x 1 dose Vancomycin 1000 mg IV every 8 hours.  Goal trough 15-20 mcg/mL.  Unasyn 3000 mg IV every 6 hours Monitor labs, c/s, and vanco trough as indicated  Height: 5\' 7"  (170.2 cm) Weight: 187 lb (84.8 kg) IBW/kg (Calculated) : 66.1  Temp (24hrs), Avg:99.1 F (37.3 C), Min:98.4 F (36.9 C), Max:99.8 F (37.7 C)  Recent Labs  Lab 10/15/17 0239  WBC 8.7  CREATININE 1.24    Estimated Creatinine Clearance: 89 mL/min (by C-G formula based on SCr of 1.24 mg/dL).    Allergies  Allergen Reactions  . Peanut-Containing Drug Products Anaphylaxis    Antimicrobials this admission: Vanco 8/3 >>  Unasyn 8/3 >>  Cipro 8/3 x 1 dose  Dose adjustments this admission: N/A  Microbiology results: None pending  Thank you for allowing pharmacy to be a part of this patient's care.  Tad MooreSteven C Jemery Stacey 10/15/2017 8:25 AM

## 2017-10-15 NOTE — ED Provider Notes (Signed)
[facility] EMERGENCY DEPARTMENT Provider Note   CSN: [id number] Arrival date & time: 10/15/17  0010     History   Chief Complaint Chief Complaint  Patient presents with  . Abscess    HPI Phillip Morales is a 32 y.o. male.  The history is provided by the patient.  He has history of asthma, hypertension, HIV infection and comes in with abscess in his rectal area.  He states he has had this twice before.  Abscesses on the right and getting worse.  He first noticed it 2 days ago.  He has soaked in warm water which has given temporary relief.  Of note, he states that he has been off his HIV medications for the last month.  He denies fever or chills.  Pain is rated at 10/10.  Past Medical History:  Diagnosis Date  . Asthma   . HIV (human immunodeficiency virus infection) (HCC)   . Hypertension     Patient Active Problem List   Diagnosis Date Noted  . Vitamin D deficiency 05/27/2014  . 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 Essential hypertension 12/25/2013  . Tobacco dependence 12/25/2013  . History of MRSA infection 02/16/2013  . Essential hypertension, benign 02/16/2013  . History of syphilis 02/16/2013  . Human immunodeficiency virus (HIV) disease (HCC) 10/24/2012  . Anemia 10/23/2012  . Gluteal abscess 10/22/2012    History reviewed. No pertinent surgical history.      Home Medications    Prior to Admission medications   Medication Sig Start Date End Date Taking? Authorizing Provider  amLODipine (NORVASC) 5 MG tablet Take 1 tablet (5 mg total) by mouth daily. 07/09/17   Mancel Bale, MD  GENVOYA 150-150-200-10 MG TABS tablet TAKE 1 TABLET BY MOUTH DAILY WITH BREAKFAST Patient not taking: Reported on 07/09/2017 08/06/15   Gardiner Barefoot, MD  hydrochlorothiazide (HYDRODIURIL) 12.5 MG tablet Take 1 tablet (12.5 mg total) by mouth daily. 07/09/17   Mancel Bale, MD  loratadine (CLARITIN) 10 MG tablet Take 1 tablet (10 mg total) by mouth daily. Patient taking differently: Take  10 mg by mouth daily as needed for allergies.  11/29/16   Loren Racer, MD  mometasone (NASONEX) 50 MCG/ACT nasal spray Place 2 sprays into the nose daily. Patient not taking: Reported on 07/09/2017 11/29/16   Loren Racer, MD    Family History Family History  Problem Relation Age of Onset  . Kidney disease Neg Hx     Social History Social History   Tobacco Use  . Smoking status: Current Some Day Smoker    Packs/day: 0.10    Types: Cigarettes    Start date: 03/03/2013  . Smokeless tobacco: Never Used  Substance Use Topics  . Alcohol use: Yes    Alcohol/week: 0.0 oz    Comment: social  . Drug use: Yes    Types: Marijuana    Comment: 2 days ago     Allergies   Peanut-containing drug products   Review of Systems Review of Systems  All other systems reviewed and are negative.    Physical Exam Updated Vital Signs BP 133/76 (BP Location: Left Arm)   Pulse 93   Temp 99.8 F (37.7 C) (Oral)   Resp 18   Ht 5\' 7"  (1.702 m)   Wt 84.8 kg (187 lb)   SpO2 100%   BMI 29.29 kg/m   Physical Exam  Nursing note and vitals reviewed.  32 year old male, resting comfortably and in no acute distress. Vital signs are  normal. Oxygen saturation is 100%, which is normal. Head is normocephalic and atraumatic. PERRLA, EOMI. Oropharynx is clear. Neck is nontender and supple without adenopathy or JVD. Back is nontender and there is no CVA tenderness. Lungs are clear without rales, wheezes, or rhonchi. Chest is nontender. Heart has regular rate and rhythm without murmur. Abdomen is soft, flat, nontender without masses or hepatosplenomegaly and peristalsis is normoactive. Rectal: Induration noted in the medial aspect of both buttocks with significant tenderness.  Area of induration is greater on the right and extends down to the anus. Extremities have no cyanosis or edema, full range of motion is present. Skin is warm and dry without rash. Neurologic: Mental status is normal,  cranial nerves are intact, there are no motor or sensory deficits.  ED Treatments / Results  Labs (all labs ordered are listed, but only abnormal results are displayed) Labs Reviewed  BASIC METABOLIC PANEL - Abnormal; Notable for the following components:      Result Value   Sodium 132 (*)    Potassium 3.3 (*)    Glucose, Bld 100 (*)    Calcium 8.6 (*)    All other components within normal limits  CBC WITH DIFFERENTIAL/PLATELET - Abnormal; Notable for the following components:   RBC 3.93 (*)    Hemoglobin 11.7 (*)    HCT 33.0 (*)    All other components within normal limits   Radiology Ct Pelvis W Contrast  Result Date: 10/15/2017 CLINICAL DATA:  Right buttock abscess. EXAM: CT PELVIS WITH CONTRAST TECHNIQUE: Multidetector CT imaging of the pelvis was performed using the standard protocol following the bolus administration of intravenous contrast. CONTRAST:  ISOVUE-300 IOPAMIDOL (ISOVUE-300) INJECTION 61% COMPARISON:  Abdominopelvic CT 06/24/2012 FINDINGS: Urinary Tract: Distal ureters are nondistended. Mild bladder wall thickening inferiorly. No intravesicular air. Bowel: Diffuse rectal wall thickening. Right perirectal fluid collection with peripheral enhancement and internal air measures 4.4 x 2.6 x 4.3 cm. Inflammatory changes track along the right gluteal crease. Mild soft tissue induration tracks from the left lateral rectum to the gluteal fold with some surrounding stranding and small internal foci of air. This measures approximately 4.4 x 1.5 x 3.1 cm. Remaining pelvic bowel loops are normal. Normal appendix. Vascular/Lymphatic: Prominent left iliac node measures 15 mm. Prominent left external iliac node measures 13 mm. Additional smaller bilateral external iliac and inguinal nodes. No acute vascular findings. Reproductive:  Prostate gland is unremarkable. Other:  No free air in the pelvis. Musculoskeletal: There are no acute or suspicious osseous abnormalities. IMPRESSION: 1.  Right perirectal abscess measures 4.4 x 2.6 x 4.3 cm. 2. Soft tissue induration tracking from the left lateral rectum with small focus of internal air, suspicious for left perirectal abscess measuring 4.4 x 1.5 x 3.1 cm. 3. Mild bladder wall thickening at the bladder base of unknown significance. Electronically Signed   By: Rubye Oaks M.D.   On: 10/15/2017 03:52    Procedures Procedures  Medications Ordered in ED Medications  ciprofloxacin (CIPRO) IVPB 400 mg (has no administration in time range)  metroNIDAZOLE (FLAGYL) IVPB 500 mg (has no administration in time range)  morphine 4 MG/ML injection 4 mg (4 mg Intravenous Given 10/15/17 0236)  iopamidol (ISOVUE-300) 61 % injection 100 mL (100 mLs Intravenous Contrast Given 10/15/17 0310)  morphine 4 MG/ML injection 4 mg (4 mg Intravenous Given 10/15/17 0423)     Initial Impression / Assessment and Plan / ED Course  I have reviewed the triage vital signs and the nursing notes.  Pertinent labs & imaging results that were available during my care of the patient were reviewed by me and considered in my medical decision making (see chart for details).  Perianal abscess which appears to be bilateral, worse on the right.  Old records are reviewed, and he does have 2 prior ED visits for small perirectal abscesses.  I am concerned that this is now a complicated abscess, so he will be sent for CT scan to better evaluate the extent and exact location of the abscess.  CT confirms bilateral perirectal abscesses.  Based on findings on CT scan, I do not feel I can properly drain them in the ED.  Case is discussed with Dr. Lovell SheehanJenkins, on-call for general surgery, who requests patient be admitted to hospitalist service.  He is given ciprofloxacin and metronidazole.  Case is discussed with Dr. Robb Matarrtiz, of [facility] hospitalists, who agrees to admit the patient.  Final Clinical Impressions(s) / ED Diagnoses   Final diagnoses:  Perirectal abscess  HIV infection,  unspecified symptom status [facility](HCC)    ED Discharge Orders    None       Dione BoozeGlick, Biana Haggar, MD 10/15/17 [phone]

## 2017-10-15 NOTE — H&P (Signed)
History and Physical  WOODROE VOGAN UJW:[id number] DOB: 28-Sep-1985 DOA: 10/15/2017   PCP: Patient, No Pcp Per   Patient coming from: Home  Chief Complaint: rectal pain  HPI:  Phillip Morales is a 32 y.o. male with medical history of HIV, hypertension, tobacco abuse, marijuana use presenting with 3-day history of perirectal pain.  The patient noted some pain in his perirectal area on 10/12/2017 during dance class.  Upon further examination, the patient noted some induration in his perirectal area with pain.  The patient tried warm soaks over the next few days without improvement.  The patient did note some yellow type drainage.  He had some sweats but denies any fevers, chills, nausea, vomiting, diarrhea, hematochezia, melena.  He is not sexually active.  Because of persistent pain, the patient presented to emergency department for further evaluation.  Notably, the patient has not followed up with infectious disease with regard to his HIV for over 3 years.  He was previously on Genvoya, but has not taken any HIV medications for over 3 years at this point.  In the emergency department, the patient had a low-grade temperature of 9 9.8 F but he was hemodynamically stable.  WBC was 8.7.  Potassium was 3.3 with sodium 132.  CT of the pelvis revealed a right perirectal abscess 4.4 x 2.6 x 4.3 cm.  There is also soft tissue induration in the left lateral rectum with a small focus of internal air suspicious for left perirectal abscess as well.  Assessment/Plan: Perirectal abscess -General surgery consult -Start vancomycin and Unasyn -Please obtain any cultures if possible if the patient is to undergo I&D  -Morphine 4 mg IV every 4 hours as needed pain  HIV -Off medications for over 3 years -Check HIV RNA -Check CD4 count  Essential hypertension -Restart amlodipine  Hyponatremia -Likely secondary to HCTZ -Started IV fluids  Hypokalemia -Likely secondary to HCTZ -Replete -Check  magnesium -Holding HCTZ  Tobacco abuse I have discussed tobacco cessation with the patient.  I have counseled the patient regarding the negative impacts of continued tobacco use including but not limited to lung cancer, COPD, and cardiovascular disease.  I have discussed alternatives to tobacco and modalities that may help facilitate tobacco cessation including but not limited to biofeedback, hypnosis, and medications.  Total time spent with tobacco counseling was 4 minutes.       Past Medical History:  Diagnosis Date  . Asthma   . HIV (human immunodeficiency virus infection) (HCC)   . Hypertension    History reviewed. No pertinent surgical history. Social History:  reports that he has been smoking cigarettes.  He started smoking about 4 years ago. He has been smoking about 0.10 packs per day. He has never used smokeless tobacco. He reports that he drinks alcohol. He reports that he has current or past drug history. Drug: Marijuana.   Family History  Problem Relation Age of Onset  . Kidney disease Neg Hx      Allergies  Allergen Reactions  . Peanut-Containing Drug Products Anaphylaxis     Prior to Admission medications   Medication Sig Start Date End Date Taking? Authorizing Provider  amLODipine (NORVASC) 5 MG tablet Take 1 tablet (5 mg total) by mouth daily. 07/09/17   Mancel Bale, MD  GENVOYA 150-150-200-10 MG TABS tablet TAKE 1 TABLET BY MOUTH DAILY WITH BREAKFAST Patient not taking: Reported on 07/09/2017 08/06/15   Gardiner Barefoot, MD  hydrochlorothiazide (HYDRODIURIL) 12.5 MG tablet Take  1 tablet (12.5 mg total) by mouth daily. 07/09/17   Mancel BaleWentz, Elliott, MD  loratadine (CLARITIN) 10 MG tablet Take 1 tablet (10 mg total) by mouth daily. Patient taking differently: Take 10 mg by mouth daily as needed for allergies.  11/29/16   Loren RacerYelverton, Kalene Cutler, MD  mometasone (NASONEX) 50 MCG/ACT nasal spray Place 2 sprays into the nose daily. Patient not taking: Reported on 07/09/2017  11/29/16   Loren RacerYelverton, Ellicia Alix, MD    Review of Systems:  Constitutional:  No weight loss, night sweats, Fevers, chills, Head&Eyes: No headache.  No vision loss.  No eye pain or scotoma ENT:  No Difficulty swallowing,Tooth/dental problems,Sore throat,  No ear ache, post nasal drip,  Cardio-vascular:  No chest pain, Orthopnea, PND, swelling in lower extremities,  dizziness, palpitations  GI:  No  abdominal pain, nausea, vomiting, diarrhea, loss of appetite, hematochezia, melena, heartburn, indigestion, Resp:  No shortness of breath with exertion or at rest. No cough. No coughing up of blood .No wheezing.No chest wall deformity  Skin:  no rash or lesions.  GU:  no dysuria, change in color of urine, no urgency or frequency. No flank pain.  Musculoskeletal:  No joint pain or swelling. No decreased range of motion. No back pain.  Psych:  No change in mood or affect. No depression or anxiety. Neurologic: No headache, no dysesthesia, no focal weakness, no vision loss. No syncope  Physical Exam: Vitals:   10/15/17 0515 10/15/17 0530 10/15/17 0600 10/15/17 0721  BP: 123/70 134/73 130/70 132/72  Pulse: 81 82 80 77  Resp: 18  18 20   Temp:    98.4 F (36.9 C)  TempSrc:    Oral  SpO2: 98% 99% 100% 100%  Weight:      Height:       General:  A&O x 3, NAD, nontoxic, pleasant/cooperative Head/Eye: No conjunctival hemorrhage, no icterus, [place]/AT, No nystagmus ENT:  No icterus,  No thrush, good dentition, no pharyngeal exudate Neck:  No masses, no lymphadenpathy, no bruits CV:  RRR, no rub, no gallop, no S3 Lung:  CTAB, good air movement, no wheeze, no rhonchi Abdomen: soft/NT, +BS, nondistended, no peritoneal signs Ext: No cyanosis, No rashes, No petechiae, No lymphangitis, No edema Neuro: CNII-XII intact, strength 4/5 in bilateral upper and lower extremities, no dysmetria -Perirectal exam revealed induration at the 3 and 6:00 area.  Left gluteal fold with purulent drainage.  No  crepitance.  Labs on Admission:  Basic Metabolic Panel: Recent Labs  Lab 10/15/17 0239  NA 132*  K 3.3*  CL 98  CO2 26  GLUCOSE 100*  BUN 8  CREATININE 1.24  CALCIUM 8.6*   Liver Function Tests: No results for input(s): AST, ALT, ALKPHOS, BILITOT, PROT, ALBUMIN in the last 168 hours. No results for input(s): LIPASE, AMYLASE in the last 168 hours. No results for input(s): AMMONIA in the last 168 hours. CBC: Recent Labs  Lab 10/15/17 0239  WBC 8.7  NEUTROABS 6.6  HGB 11.7*  HCT 33.0*  MCV 84.0  PLT 193   Coagulation Profile: No results for input(s): INR, PROTIME in the last 168 hours. Cardiac Enzymes: No results for input(s): CKTOTAL, CKMB, CKMBINDEX, TROPONINI in the last 168 hours. BNP: Invalid input(s): POCBNP CBG: No results for input(s): GLUCAP in the last 168 hours. Urine analysis:    Component Value Date/Time   COLORURINE YELLOW 11/29/2016 0836   APPEARANCEUR HAZY (A) 11/29/2016 0836   LABSPEC 1.020 11/29/2016 0836   PHURINE 6.5 11/29/2016 0836   GLUCOSEU  NEGATIVE 11/29/2016 0836   HGBUR LARGE (A) 11/29/2016 0836   BILIRUBINUR NEGATIVE 11/29/2016 0836   KETONESUR NEGATIVE 11/29/2016 0836   PROTEINUR 30 (A) 11/29/2016 0836   UROBILINOGEN 0.2 02/13/2013 1205   NITRITE NEGATIVE 11/29/2016 0836   LEUKOCYTESUR SMALL (A) 11/29/2016 0836   Sepsis Labs: @LABRCNTIP (procalcitonin:4,lacticidven:4) )No results found for this or any previous visit (from the past 240 hour(s)).   Radiological Exams on Admission: Ct Pelvis W Contrast  Result Date: 10/15/2017 CLINICAL DATA:  Right buttock abscess. EXAM: CT PELVIS WITH CONTRAST TECHNIQUE: Multidetector CT imaging of the pelvis was performed using the standard protocol following the bolus administration of intravenous contrast. CONTRAST:  ISOVUE-300 IOPAMIDOL (ISOVUE-300) INJECTION 61% COMPARISON:  Abdominopelvic CT 06/24/2012 FINDINGS: Urinary Tract: Distal ureters are nondistended. Mild bladder wall thickening  inferiorly. No intravesicular air. Bowel: Diffuse rectal wall thickening. Right perirectal fluid collection with peripheral enhancement and internal air measures 4.4 x 2.6 x 4.3 cm. Inflammatory changes track along the right gluteal crease. Mild soft tissue induration tracks from the left lateral rectum to the gluteal fold with some surrounding stranding and small internal foci of air. This measures approximately 4.4 x 1.5 x 3.1 cm. Remaining pelvic bowel loops are normal. Normal appendix. Vascular/Lymphatic: Prominent left iliac node measures 15 mm. Prominent left external iliac node measures 13 mm. Additional smaller bilateral external iliac and inguinal nodes. No acute vascular findings. Reproductive:  Prostate gland is unremarkable. Other:  No free air in the pelvis. Musculoskeletal: There are no acute or suspicious osseous abnormalities. IMPRESSION: 1. Right perirectal abscess measures 4.4 x 2.6 x 4.3 cm. 2. Soft tissue induration tracking from the left lateral rectum with small focus of internal air, suspicious for left perirectal abscess measuring 4.4 x 1.5 x 3.1 cm. 3. Mild bladder wall thickening at the bladder base of unknown significance. Electronically Signed   By: Rubye Oaks M.D.   On: 10/15/2017 03:52        Time spent:60 minutes Code Status:   FULL Family Communication:  No Family at bedside Disposition Plan: expect 2-3 day hospitalization Consults called: general surgery DVT Prophylaxis: Carbon Lovenox  Catarina Hartshorn, DO  [facility] Pager [phone]  If 7PM-7AM, please contact night-coverage [email or web address] Password [email or web address] 10/15/2017, 8:01 AM

## 2017-10-15 NOTE — Consult Note (Signed)
Reason for Consult: Perirectal abscess Referring Physician: Dr. Tat  Phillip Morales is an 32 y.o. male.  HPI: Patient is a 32-year-old black male with a history of HIV who presents with perirectal pain.  He has had multiple episodes of perirectal abscesses in the past, some of which have spontaneously drained and others that required incision and drainage.  This latest episode started 3 days ago.  He developed significant rectal pain.  Denies any fever or chills.  He was seen in the emergency room last night and a CT scan of the abdomen and pelvis reveals a large 4-1/2 cm right perirectal abscess extending into the right buttock cheek and a left-sided smaller perirectal abscess.  The left side has started to drain.  He still has significant pain to palpation along both sides of the rectum.  Examination was limited due to his pain.  He has 8 out of 10 perirectal pain.  Past Medical History:  Diagnosis Date  . Asthma   . HIV (human immunodeficiency virus infection) (HCC)   . Hypertension     History reviewed. No pertinent surgical history.  Family History  Problem Relation Age of Onset  . Kidney disease Neg Hx     Social History:  reports that he has been smoking cigarettes.  He started smoking about 4 years ago. He has been smoking about 0.10 packs per day. He has never used smokeless tobacco. He reports that he drinks alcohol. He reports that he has current or past drug history. Drug: Marijuana.  Allergies:  Allergies  Allergen Reactions  . Peanut-Containing Drug Products Anaphylaxis    Medications: I have reviewed the patient's current medications.  Results for orders placed or performed during the hospital encounter of 10/15/17 (from the past 48 hour(s))  Basic metabolic panel     Status: Abnormal   Collection Time: 10/15/17  2:39 AM  Result Value Ref Range   Sodium 132 (L) 135 - 145 mmol/L   Potassium 3.3 (L) 3.5 - 5.1 mmol/L   Chloride 98 98 - 111 mmol/L   CO2 26 22 - 32 mmol/L    Glucose, Bld 100 (H) 70 - 99 mg/dL   BUN 8 6 - 20 mg/dL   Creatinine, Ser 1.24 0.61 - 1.24 mg/dL   Calcium 8.6 (L) 8.9 - 10.3 mg/dL   GFR calc non Af Amer >60 >60 mL/min   GFR calc Af Amer >60 >60 mL/min    Comment: (NOTE) The eGFR has been calculated using the CKD EPI equation. This calculation has not been validated in all clinical situations. eGFR's persistently <60 mL/min signify possible Chronic Kidney Disease.    Anion gap 8 5 - 15    Comment: Performed at [facility], [address] [place],  [address]  CBC with Differential     Status: Abnormal   Collection Time: 10/15/17  2:39 AM  Result Value Ref Range   WBC 8.7 4.0 - 10.5 K/uL   RBC 3.93 (L) 4.22 - 5.81 MIL/uL   Hemoglobin 11.7 (L) 13.0 - 17.0 g/dL   HCT 33.0 (L) 39.0 - 52.0 %   MCV 84.0 78.0 - 100.0 fL   MCH 29.8 26.0 - 34.0 pg   MCHC 35.5 30.0 - 36.0 g/dL   RDW 13.2 11.5 - 15.5 %   Platelets 193 150 - 400 K/uL   Neutrophils Relative % 76 %   Neutro Abs 6.6 1.7 - 7.7 K/uL   Lymphocytes Relative 15 %   Lymphs Abs 1.3   0.7 - 4.0 K/uL   Monocytes Relative 9 %   Monocytes Absolute 0.8 0.1 - 1.0 K/uL   Eosinophils Relative 0 %   Eosinophils Absolute 0.0 0.0 - 0.7 K/uL   Basophils Relative 0 %   Basophils Absolute 0.0 0.0 - 0.1 K/uL    Comment: Performed at [facility], [address], [place], [place] [address]    Ct Pelvis W Contrast  Result Date: 10/15/2017 CLINICAL DATA:  Right buttock abscess. EXAM: CT PELVIS WITH CONTRAST TECHNIQUE: Multidetector CT imaging of the pelvis was performed using the standard protocol following the bolus administration of intravenous contrast. CONTRAST:  130m ISOVUE-300 IOPAMIDOL (ISOVUE-300) INJECTION 61% COMPARISON:  Abdominopelvic CT 06/24/2012 FINDINGS: Urinary Tract: Distal ureters are nondistended. Mild bladder wall thickening inferiorly. No intravesicular air. Bowel: Diffuse rectal wall thickening. Right perirectal fluid collection with peripheral enhancement and  internal air measures 4.4 x 2.6 x 4.3 cm. Inflammatory changes track along the right gluteal crease. Mild soft tissue induration tracks from the left lateral rectum to the gluteal fold with some surrounding stranding and small internal foci of air. This measures approximately 4.4 x 1.5 x 3.1 cm. Remaining pelvic bowel loops are normal. Normal appendix. Vascular/Lymphatic: Prominent left iliac node measures 15 mm. Prominent left external iliac node measures 13 mm. Additional smaller bilateral external iliac and inguinal nodes. No acute vascular findings. Reproductive:  Prostate gland is unremarkable. Other:  No free air in the pelvis. Musculoskeletal: There are no acute or suspicious osseous abnormalities. IMPRESSION: 1. Right perirectal abscess measures 4.4 x 2.6 x 4.3 cm. 2. Soft tissue induration tracking from the left lateral rectum with small focus of internal air, suspicious for left perirectal abscess measuring 4.4 x 1.5 x 3.1 cm. 3. Mild bladder wall thickening at the bladder base of unknown significance. Electronically Signed   By: MJeb LeveringM.D.   On: 10/15/2017 03:52    ROS:  Pertinent items are noted in HPI.  Blood pressure 132/72, pulse 77, temperature 98.4 F (36.9 C), temperature source Oral, resp. rate 20, height 5' 7" (1.702 m), weight 187 lb (84.8 kg), SpO2 100 %. Physical Exam: Pleasant well-developed well-nourished black male no acute distress Head is normocephalic, atraumatic Lungs clear to auscultation with good breath sounds bilaterally Heart examination reveals regular rate and rhythm without S3, S4, murmurs Rectal examination which was limited revealed a fluctuant area just outside the rectum on the right side with extension into the buttock.  Left perirectal region with induration and minimal fluctuance extending in the left buttock cheek.  Some purulent drainage noted on the left side.  CT scan images personally reviewed  Assessment/Plan: Impression: Recurrent  perirectal abscesses, HIV positive Plan: Continue IV antibiotics as ordered.  If the right-sided abscess does not spontaneously rupture, will take patient to the operating room tomorrow for incision and drainage.  The risks and benefits of the procedure including bleeding, infection, and recurrence of the abscesses were fully explained to the patient, who gave informed consent.  MAviva Signs8/05/2017, 10:06 AM

## 2017-10-16 ENCOUNTER — Inpatient Hospital Stay (HOSPITAL_COMMUNITY): Payer: Self-pay | Admitting: Anesthesiology

## 2017-10-16 ENCOUNTER — Encounter (HOSPITAL_COMMUNITY): Payer: Self-pay

## 2017-10-16 ENCOUNTER — Encounter (HOSPITAL_COMMUNITY)
Admission: EM | Disposition: A | Discharge status: Home / Self Care | Payer: Self-pay | Source: Home / Self Care | Attending: Internal Medicine

## 2017-10-16 LAB — CBC
HCT: 32.1 % — ABNORMAL LOW (ref 39.0–52.0)
HEMOGLOBIN: 11.4 g/dL — AB (ref 13.0–17.0)
MCH: 29.6 pg (ref 26.0–34.0)
MCHC: 35.5 g/dL (ref 30.0–36.0)
MCV: 83.4 fL (ref 78.0–100.0)
Platelets: 181 10*3/uL (ref 150–400)
RBC: 3.85 MIL/uL — AB (ref 4.22–5.81)
RDW: 13 % (ref 11.5–15.5)
WBC: 9.8 10*3/uL (ref 4.0–10.5)

## 2017-10-16 LAB — BASIC METABOLIC PANEL
Anion gap: 6 (ref 5–15)
BUN: 8 mg/dL (ref 6–20)
CALCIUM: 8.5 mg/dL — AB (ref 8.9–10.3)
CO2: 27 mmol/L (ref 22–32)
Chloride: 99 mmol/L (ref 98–111)
Creatinine, Ser: 1 mg/dL (ref 0.61–1.24)
GLUCOSE: 102 mg/dL — AB (ref 70–99)
Potassium: 3.7 mmol/L (ref 3.5–5.1)
Sodium: 132 mmol/L — ABNORMAL LOW (ref 135–145)

## 2017-10-16 LAB — VANCOMYCIN, TROUGH: Vancomycin Tr: 12 ug/mL — ABNORMAL LOW (ref 15–20)

## 2017-10-16 LAB — HIV-1 RNA QUANT-NO REFLEX-BLD
HIV 1 RNA QUANT: 9450 {copies}/mL
LOG10 HIV-1 RNA: 3.975 {Log_copies}/mL

## 2017-10-16 LAB — MAGNESIUM: MAGNESIUM: 1.8 mg/dL (ref 1.7–2.4)

## 2017-10-16 SURGERY — INCISION AND DRAINAGE, ABSCESS
Anesthesia: General

## 2017-10-16 MED ORDER — CHLORHEXIDINE GLUCONATE CLOTH 2 % EX PADS
6.0000 | MEDICATED_PAD | Freq: Once | CUTANEOUS | Status: AC
  Administered 2017-10-16: 6 via TOPICAL

## 2017-10-16 MED ORDER — CHLORHEXIDINE GLUCONATE CLOTH 2 % EX PADS: 6.0000 | MEDICATED_PAD | Freq: Every day | CUTANEOUS | Status: DC

## 2017-10-16 MED ORDER — OXYCODONE-ACETAMINOPHEN 7.5-325 MG PO TABS: 1.0000 | ORAL_TABLET | Freq: Four times a day (QID) | ORAL | Status: DC | PRN

## 2017-10-16 MED ORDER — SODIUM CHLORIDE 0.9 % IV SOLN
1250.0000 mg | Freq: Three times a day (TID) | INTRAVENOUS | Status: DC
  Administered 2017-10-17 (×2): 1250 mg via INTRAVENOUS
  Filled 2017-10-16 (×5): qty 1250

## 2017-10-16 MED ORDER — HYDROMORPHONE HCL 1 MG/ML IJ SOLN
1.0000 mg | INTRAMUSCULAR | Status: DC | PRN
  Administered 2017-10-16 – 2017-10-17 (×7): 1 mg via INTRAVENOUS
  Filled 2017-10-16 (×7): qty 1

## 2017-10-16 MED ORDER — MUPIROCIN 2 % EX OINT
1.0000 "application " | TOPICAL_OINTMENT | Freq: Two times a day (BID) | CUTANEOUS | Status: DC
  Administered 2017-10-16 – 2017-10-17 (×2): 1 via NASAL
  Filled 2017-10-16: qty 22

## 2017-10-16 MED ORDER — CHLORHEXIDINE GLUCONATE CLOTH 2 % EX PADS: 6.0000 | MEDICATED_PAD | Freq: Once | CUTANEOUS | Status: DC

## 2017-10-16 NOTE — Progress Notes (Signed)
Pharmacy Antibiotic Note  Noralee CharsJustin A Stangler is a 32 y.o. male admitted on 10/15/2017 with wound infection.  Pharmacy has been consulted for Vancomycin dosing.  Plan: Vancomycin trough of 12 mcg/mL Increase Vancomycin to 1250 mg IV every 8 hours. Goal trough 15-20 mcg/mL. Unasyn 3000 mg IV every 6 hours Monitor labs, c/s, and vanco trough as indicated  Height: 5\' 7"  (170.2 cm) Weight: 187 lb (84.8 kg) IBW/kg (Calculated) : 66.1  Temp (24hrs), Avg:99.5 F (37.5 C), Min:98.7 F (37.1 C), Max:100.8 F (38.2 C)  Recent Labs  Lab 10/15/17 0239 10/16/17 0855 10/16/17 1649  WBC 8.7 9.8  --   CREATININE 1.24 1.00  --   VANCOTROUGH  --   --  12*    Estimated Creatinine Clearance: 110.4 mL/min (by C-G formula based on SCr of 1 mg/dL).    Allergies  Allergen Reactions  . Peanut-Containing Drug Products Anaphylaxis    Antimicrobials this admission: Vanco 8/3 >>  Unasyn 8/3 >>  Cipro 8/3 x 1 dose  Dose adjustments this admission: N/A  Microbiology results: Urine cx: pending Surgical PCR: positive  Thank you for allowing pharmacy to be a part of this patient's care.  Tad MooreSteven C Uliana Brinker 10/16/2017 5:59 PM

## 2017-10-16 NOTE — Progress Notes (Signed)
PROGRESS NOTE  Phillip Morales UJW:[id number] DOB: 06/30/1985 DOA: 10/15/2017 PCP: Patient, No Pcp Per  Brief History:   32 y.o. male with medical history of HIV, hypertension, tobacco abuse, marijuana use presenting with 3-day history of perirectal pain.  The patient noted some pain in his perirectal area on 10/12/2017 during dance class.  Upon further examination, the patient noted some induration in his perirectal area with pain.  The patient tried warm soaks over the next few days without improvement.  The patient did note some yellow type drainage.   Because of persistent pain, the patient presented to emergency department for further evaluation.  Notably, the patient has not followed up with infectious disease with regard to his HIV for over 3 years.  He was previously on Genvoya, but has not taken any HIV medications for over 3 years at this point.  CT of the pelvis revealed a right perirectal abscess 4.4 x 2.6 x 4.3 cm.  There is also soft tissue induration in the left lateral rectum with a small focus of internal air suspicious for left perirectal abscess as well. General surgery was consulted to assist with management   Assessment/Plan: Perirectal abscess -General surgery consult appreciated -right side abscess ruptured spontaneously in pre-op area-->surgery cancelled -continue vancomycin and Unasyn -Please obtain any cultures if possible if the patient is to undergo I&D  -continue dilaudid prn pain -had low grade fever 100.8 last night -am CBC  HIV -Off medications for over 3 years -Check HIV RNA--pending -Check CD4 count--pending  Essential hypertension -Restart amlodipine--increase to 10 mg daily  Hyponatremia -Likely secondary to HCTZ -Started IV fluids  Hypokalemia -Likely secondary to HCTZ -Replete -Check magnesium--1.8 -Holding HCTZ  Tobacco abuse I have discussed tobacco cessation with the patient.  I have counseled the patient regarding the negative  impacts of continued tobacco use including but not limited to lung cancer, COPD, and cardiovascular disease.  I have discussed alternatives to tobacco and modalities that may help facilitate tobacco cessation including but not limited to biofeedback, hypnosis, and medications.  Total time spent with tobacco counseling was 4 minutes.  Disposition Plan:   Home 8/5 or 8/6 Family Communication:   Family at bedside  Consultants:  General surgery  Code Status:  FULL   DVT Prophylaxis:  [place] Lovenox   Procedures: As Listed in Progress Note Above  Antibiotics: vanco 8/3>>> unasyn 8/3>>>    Subjective: Pt still c/o significant pain in gluteal area.  Controlled with IV dilaudid.  Denies cp, sob, n/v/d  Objective: Vitals:   10/15/17 1352 10/15/17 2243 10/16/17 0648 10/16/17 0722  BP: 124/75 140/78 (!) 148/83 (!) 147/79  Pulse: 87 86 86 92  Resp: 18 20 20 18   Temp: 100 F (37.8 C) (!) 100.8 F (38.2 C) 99.1 F (37.3 C) 98.7 F (37.1 C)  TempSrc: Oral Oral Oral Oral  SpO2: 100% 99% 99% 99%  Weight:      Height:        Intake/Output Summary (Last 24 hours) at 10/16/2017 1649 Last data filed at 10/16/2017 1314 Gross per 24 hour  Intake 2290 ml  Output 2100 ml  Net 190 ml   Weight change:  Exam:   General:  Pt is alert, follows commands appropriately, not in acute distress  HEENT: No icterus, No thrush, No neck mass, [place]/AT  Cardiovascular: RRR, S1/S2, no rubs, no gallops  Respiratory: CTA bilaterally, no wheezing, no crackles, no rhonchi  Abdomen: Soft/+BS,  non tender, non distended, no guarding  Extremities: No edema, No lymphangitis, No petechiae, No rashes, no synovitis   Data Reviewed: I have personally reviewed following labs and imaging studies Basic Metabolic Panel: Recent Labs  Lab 10/15/17 0239 10/16/17 0855  NA 132* 132*  K 3.3* 3.7  CL 98 99  CO2 26 27  GLUCOSE 100* 102*  BUN 8 8  CREATININE 1.24 1.00  CALCIUM 8.6* 8.5*  MG  --  1.8   Liver  Function Tests: No results for input(s): AST, ALT, ALKPHOS, BILITOT, PROT, ALBUMIN in the last 168 hours. No results for input(s): LIPASE, AMYLASE in the last 168 hours. No results for input(s): AMMONIA in the last 168 hours. Coagulation Profile: No results for input(s): INR, PROTIME in the last 168 hours. CBC: Recent Labs  Lab 10/15/17 0239 10/16/17 0855  WBC 8.7 9.8  NEUTROABS 6.6  --   HGB 11.7* 11.4*  HCT 33.0* 32.1*  MCV 84.0 83.4  PLT 193 181   Cardiac Enzymes: No results for input(s): CKTOTAL, CKMB, CKMBINDEX, TROPONINI in the last 168 hours. BNP: Invalid input(s): POCBNP CBG: No results for input(s): GLUCAP in the last 168 hours. HbA1C: No results for input(s): HGBA1C in the last 72 hours. Urine analysis:    Component Value Date/Time   COLORURINE YELLOW 11/29/2016 0836   APPEARANCEUR HAZY (A) 11/29/2016 0836   LABSPEC 1.020 11/29/2016 0836   PHURINE 6.5 11/29/2016 0836   GLUCOSEU NEGATIVE 11/29/2016 0836   HGBUR LARGE (A) 11/29/2016 0836   BILIRUBINUR NEGATIVE 11/29/2016 0836   KETONESUR NEGATIVE 11/29/2016 0836   PROTEINUR 30 (A) 11/29/2016 0836   UROBILINOGEN 0.2 02/13/2013 1205   NITRITE NEGATIVE 11/29/2016 0836   LEUKOCYTESUR SMALL (A) 11/29/2016 0836   Sepsis Labs: @LABRCNTIP (procalcitonin:4,lacticidven:4) ) Recent Results (from the past 240 hour(s))  Surgical pcr screen     Status: Abnormal   Collection Time: 10/15/17  4:55 PM  Result Value Ref Range Status   MRSA, PCR NEGATIVE NEGATIVE Final   Staphylococcus aureus POSITIVE (A) NEGATIVE Final    Comment: (NOTE) The Xpert SA Assay (FDA approved for NASAL specimens in patients 80 years of age and older), is one component of a comprehensive surveillance program. It is not intended to diagnose infection nor to guide or monitor treatment. Performed at [facility], [address], [place], [place] [address]      Scheduled Meds: . amLODipine  5 mg Oral Daily  . [START ON 10/17/2017] Chlorhexidine  Gluconate Cloth  6 each Topical Q0600  . enoxaparin (LOVENOX) injection  40 mg Subcutaneous Q24H  . mupirocin ointment  1 application Nasal BID   Continuous Infusions: . ampicillin-sulbactam (UNASYN) IV Stopped (10/16/17 1314)  . vancomycin Stopped (10/16/17 1141)    Procedures/Studies: Ct Pelvis W Contrast  Result Date: 10/15/2017 CLINICAL DATA:  Right buttock abscess. EXAM: CT PELVIS WITH CONTRAST TECHNIQUE: Multidetector CT imaging of the pelvis was performed using the standard protocol following the bolus administration of intravenous contrast. CONTRAST:  ISOVUE-300 IOPAMIDOL (ISOVUE-300) INJECTION 61% COMPARISON:  Abdominopelvic CT 06/24/2012 FINDINGS: Urinary Tract: Distal ureters are nondistended. Mild bladder wall thickening inferiorly. No intravesicular air. Bowel: Diffuse rectal wall thickening. Right perirectal fluid collection with peripheral enhancement and internal air measures 4.4 x 2.6 x 4.3 cm. Inflammatory changes track along the right gluteal crease. Mild soft tissue induration tracks from the left lateral rectum to the gluteal fold with some surrounding stranding and small internal foci of air. This measures approximately 4.4 x 1.5 x 3.1  cm. Remaining pelvic bowel loops are normal. Normal appendix. Vascular/Lymphatic: Prominent left iliac node measures 15 mm. Prominent left external iliac node measures 13 mm. Additional smaller bilateral external iliac and inguinal nodes. No acute vascular findings. Reproductive:  Prostate gland is unremarkable. Other:  No free air in the pelvis. Musculoskeletal: There are no acute or suspicious osseous abnormalities. IMPRESSION: 1. Right perirectal abscess measures 4.4 x 2.6 x 4.3 cm. 2. Soft tissue induration tracking from the left lateral rectum with small focus of internal air, suspicious for left perirectal abscess measuring 4.4 x 1.5 x 3.1 cm. 3. Mild bladder wall thickening at the bladder base of unknown significance. Electronically Signed    By: Rubye Oaks M.D.   On: 10/15/2017 03:52    Catarina Hartshorn, DO  [facility] Pager [phone]  If 7PM-7AM, please contact night-coverage [email or web address] Password [email or web address] 10/16/2017, 4:49 PM   LOS: 1 day

## 2017-10-16 NOTE — Progress Notes (Signed)
Brought the pt a sitz bath basin and offered to set it up for him. Pt refused and said he has used them before and that he would do it himself.

## 2017-10-16 NOTE — Progress Notes (Signed)
Day of Surgery  Subjective: Patient in the preoperative area had a rupture of his abscess.  Objective: Vital signs in last 24 hours: Temp:  [98.7 F (37.1 C)-100.8 F (38.2 C)] 98.7 F (37.1 C) (08/04 0722) Pulse Rate:  [86-92] 92 (08/04 0722) Resp:  [18-20] 18 (08/04 0722) BP: (124-148)/(75-83) 147/79 (08/04 0722) SpO2:  [99 %-100 %] 99 % (08/04 0722)    Intake/Output from previous day: 08/03 0701 - 08/04 0700 In: 3677.1 [P.O.:480; I.V.:1543.8; IV Piggyback:1653.3] Out: 2100 [Urine:2100] Intake/Output this shift: No intake/output data recorded.  General appearance: alert, cooperative and no distress GI: Draining bilateral perirectal abscesses.  Induration still present.  No significant fluctuance noted.  Lab Results:  Recent Labs    10/15/17 0239  WBC 8.7  HGB 11.7*  HCT 33.0*  PLT 193   BMET Recent Labs    10/15/17 0239  NA 132*  K 3.3*  CL 98  CO2 26  GLUCOSE 100*  BUN 8  CREATININE 1.24  CALCIUM 8.6*   PT/INR No results for input(s): LABPROT, INR in the last 72 hours.  Studies/Results: Ct Pelvis W Contrast  Result Date: 10/15/2017 CLINICAL DATA:  Right buttock abscess. EXAM: CT PELVIS WITH CONTRAST TECHNIQUE: Multidetector CT imaging of the pelvis was performed using the standard protocol following the bolus administration of intravenous contrast. CONTRAST:  100mL ISOVUE-300 IOPAMIDOL (ISOVUE-300) INJECTION 61% COMPARISON:  Abdominopelvic CT 06/24/2012 FINDINGS: Urinary Tract: Distal ureters are nondistended. Mild bladder wall thickening inferiorly. No intravesicular air. Bowel: Diffuse rectal wall thickening. Right perirectal fluid collection with peripheral enhancement and internal air measures 4.4 x 2.6 x 4.3 cm. Inflammatory changes track along the right gluteal crease. Mild soft tissue induration tracks from the left lateral rectum to the gluteal fold with some surrounding stranding and small internal foci of air. This measures approximately 4.4 x 1.5 x  3.1 cm. Remaining pelvic bowel loops are normal. Normal appendix. Vascular/Lymphatic: Prominent left iliac node measures 15 mm. Prominent left external iliac node measures 13 mm. Additional smaller bilateral external iliac and inguinal nodes. No acute vascular findings. Reproductive:  Prostate gland is unremarkable. Other:  No free air in the pelvis. Musculoskeletal: There are no acute or suspicious osseous abnormalities. IMPRESSION: 1. Right perirectal abscess measures 4.4 x 2.6 x 4.3 cm. 2. Soft tissue induration tracking from the left lateral rectum with small focus of internal air, suspicious for left perirectal abscess measuring 4.4 x 1.5 x 3.1 cm. 3. Mild bladder wall thickening at the bladder base of unknown significance. Electronically Signed   By: Rubye OaksMelanie  Ehinger M.D.   On: 10/15/2017 03:52    Anti-infectives: Anti-infectives (From admission, onward)   Start     Dose/Rate Route Frequency Ordered Stop   10/15/17 1800  [MAR Hold]  vancomycin (VANCOCIN) IVPB 1000 mg/200 mL premix     (MAR Hold since Sun 10/16/2017 at 0706. Reason: Transfer to a Procedural area.)   1,000 mg 200 mL/hr over 60 Minutes Intravenous Every 8 hours 10/15/17 0823     10/15/17 1000  [MAR Hold]  Ampicillin-Sulbactam (UNASYN) 3 g in sodium chloride 0.9 % 100 mL IVPB     (MAR Hold since Sun 10/16/2017 at 0706. Reason: Transfer to a Procedural area.)   3 g 200 mL/hr over 30 Minutes Intravenous Every 6 hours 10/15/17 0803     10/15/17 0930  vancomycin (VANCOCIN) 1,750 mg in sodium chloride 0.9 % 500 mL IVPB     1,750 mg 250 mL/hr over 120 Minutes Intravenous  Once 10/15/17  8119 10/15/17 1218   10/15/17 0600  ciprofloxacin (CIPRO) IVPB 400 mg     400 mg 200 mL/hr over 60 Minutes Intravenous  Once 10/15/17 0554 10/15/17 0750   10/15/17 0600  metroNIDAZOLE (FLAGYL) IVPB 500 mg  Status:  Discontinued     500 mg 100 mL/hr over 60 Minutes Intravenous  Once 10/15/17 0554 10/15/17 0800      Assessment/Plan: Impression:  Perirectal abscess, bilateral.  The right perirectal abscess has ruptured, thus surgery canceled. Plan: Continue IV antibiotics.  Will start sitz baths.  Anticipate discharge in next 24 to 48 hours.  LOS: 1 day    Franky Macho 10/16/2017

## 2017-10-17 DIAGNOSIS — I10 Essential (primary) hypertension: Secondary | ICD-10-CM

## 2017-10-17 DIAGNOSIS — L0231 Cutaneous abscess of buttock: Secondary | ICD-10-CM

## 2017-10-17 DIAGNOSIS — K611 Rectal abscess: Principal | ICD-10-CM

## 2017-10-17 DIAGNOSIS — B20 Human immunodeficiency virus [HIV] disease: Secondary | ICD-10-CM

## 2017-10-17 LAB — BASIC METABOLIC PANEL
ANION GAP: 7 (ref 5–15)
BUN: 9 mg/dL (ref 6–20)
CHLORIDE: 99 mmol/L (ref 98–111)
CO2: 26 mmol/L (ref 22–32)
Calcium: 8.5 mg/dL — ABNORMAL LOW (ref 8.9–10.3)
Creatinine, Ser: 0.98 mg/dL (ref 0.61–1.24)
GFR calc Af Amer: >60 mL/min (ref >60)
Glucose, Bld: 84 mg/dL (ref 70–99)
POTASSIUM: 3.8 mmol/L (ref 3.5–5.1)
Sodium: 132 mmol/L — ABNORMAL LOW (ref 135–145)

## 2017-10-17 LAB — CBC
HEMATOCRIT: 31.5 % — AB (ref 39.0–52.0)
HEMOGLOBIN: 11 g/dL — AB (ref 13.0–17.0)
MCH: 29.2 pg (ref 26.0–34.0)
MCHC: 34.9 g/dL (ref 30.0–36.0)
MCV: 83.6 fL (ref 78.0–100.0)
Platelets: 157 10*3/uL (ref 150–400)
RBC: 3.77 MIL/uL — ABNORMAL LOW (ref 4.22–5.81)
RDW: 12.9 % (ref 11.5–15.5)
WBC: 7.1 10*3/uL (ref 4.0–10.5)

## 2017-10-17 LAB — T-HELPER CELLS (CD4) COUNT (NOT AT ARMC)
CD4 % Helper T Cell: 11 % — ABNORMAL LOW (ref 33–55)
CD4 T CELL ABS: 120 /uL — AB (ref 400–2700)

## 2017-10-17 MED ORDER — VANCOMYCIN HCL 1000 MG IV SOLR
INTRAVENOUS | Status: AC
  Filled 2017-10-17: qty 1000

## 2017-10-17 MED ORDER — AMOXICILLIN-POT CLAVULANATE 875-125 MG PO TABS: 1.0000 | ORAL_TABLET | Freq: Two times a day (BID) | ORAL | 0 refills | Status: DC

## 2017-10-17 MED ORDER — DOXYCYCLINE HYCLATE 100 MG PO TABS: 100.0000 mg | ORAL_TABLET | Freq: Two times a day (BID) | ORAL | Status: DC

## 2017-10-17 MED ORDER — DOXYCYCLINE HYCLATE 100 MG PO TABS: 100.0000 mg | ORAL_TABLET | Freq: Two times a day (BID) | ORAL | 0 refills | Status: DC

## 2017-10-17 MED ORDER — VANCOMYCIN HCL 500 MG IV SOLR
INTRAVENOUS | Status: AC
  Filled 2017-10-17: qty 500

## 2017-10-17 MED ORDER — AMOXICILLIN-POT CLAVULANATE 875-125 MG PO TABS: 1.0000 | ORAL_TABLET | Freq: Two times a day (BID) | ORAL | Status: DC

## 2017-10-17 MED ORDER — OXYCODONE-ACETAMINOPHEN 5-325 MG PO TABS: 1.0000 | ORAL_TABLET | ORAL | 0 refills | Status: DC | PRN

## 2017-10-17 MED ORDER — SULFAMETHOXAZOLE-TRIMETHOPRIM 800-160 MG PO TABS: 1.0000 | ORAL_TABLET | Freq: Every day | ORAL | 1 refills | Status: DC

## 2017-10-17 MED ORDER — SULFAMETHOXAZOLE-TRIMETHOPRIM 800-160 MG PO TABS: 1.0000 | ORAL_TABLET | Freq: Every day | ORAL | Status: DC

## 2017-10-17 NOTE — Progress Notes (Signed)
Patient states understanding of discharge instructions.  

## 2017-10-17 NOTE — Discharge Summary (Signed)
Physician Discharge Summary  Phillip Morales XBJ:[id number] DOB: 1985-08-01 DOA: 10/15/2017  PCP: Patient, No Pcp Per  Admit date: 10/15/2017 Discharge date: 10/17/2017  Admitted From: Home Disposition:  Home   Recommendations for Outpatient Follow-up:  1. Follow up with PCP in 1-2 weeks 2. Please obtain BMP/CBC in one week    Discharge Condition: Stable CODE STATUS: FULL Diet recommendation: Heart Healthy   Brief/Interim Summary: 32 y.o.malewith medical history ofHIV,hypertension, tobacco abuse, marijuana use presenting with 3-day history of perirectal pain. The patient noted some pain in his perirectal area on 10/12/2017 during dance class. Upon further examination, the patient noted some induration in his perirectal area with pain. The patient tried warm soaks over the next few days without improvement. The patient did note some yellow type drainage.  Because of persistent pain, the patient presented to emergency department for further evaluation. Notably, the patient has not followed up with infectious disease with regard to his HIV for over 3 years. He was previously on Genvoya,but has not taken any HIV medications for over 3 years at this point.  CT of the pelvis revealed a right perirectal abscess 4.4 x 2.6 x 4.3 cm. There is also soft tissue induration in the left lateral rectum with a small focus of internal air suspicious for left perirectal abscess as well. General surgery was consulted to assist with management    Discharge Diagnoses:  Perirectal abscess -General surgery consult appreciated -right side abscess ruptured spontaneously in pre-op area-->surgery cancelled -left side abscess-->draining sponteously -continue vancomycin and Unasyn -induration slowly improving with continued spontaneous drainage -d/c home with amox/clav & doxy x 8 more days to complete 10 days of tx -d/c home with percocet prn pan  AIDS -Off medications for over 3 years -Check HIV  RNA--9450 -Check CD4 count--120/11% -start Bactrim for PCP prphylaxis  Essential hypertension -Restart amlodipine  Hyponatremia -Likely secondary to HCTZ -Started IV fluids  Hypokalemia -Likely secondary to HCTZ -Replete -Check magnesium--1.8 -Holding HCTZ  Tobacco abuse I have discussed tobacco cessation with the patient. I have counseled the patient regarding the negative impacts of continued tobacco use including but not limited to lung cancer, COPD, and cardiovascular disease. I have discussed alternatives to tobacco and modalities that may help facilitate tobacco cessation including but not limited to biofeedback, hypnosis, and medications. Total time spent with tobacco counseling was 4 minutes.    Discharge Instructions   Allergies as of 10/17/2017      Reactions   Peanut-containing Drug Products Anaphylaxis      Medication List    STOP taking these medications   hydrochlorothiazide 12.5 MG tablet Commonly known as:  HYDRODIURIL     TAKE these medications   amLODipine 5 MG tablet Commonly known as:  NORVASC Take 1 tablet (5 mg total) by mouth daily.   amoxicillin-clavulanate 875-125 MG tablet Commonly known as:  AUGMENTIN Take 1 tablet by mouth every 12 (twelve) hours.   doxycycline 100 MG tablet Commonly known as:  VIBRA-TABS Take 1 tablet (100 mg total) by mouth every 12 (twelve) hours.   GENVOYA 150-150-200-10 MG Tabs tablet Generic drug:  elvitegravir-cobicistat-emtricitabine-tenofovir TAKE 1 TABLET BY MOUTH DAILY WITH BREAKFAST   oxyCODONE-acetaminophen 5-325 MG tablet Commonly known as:  PERCOCET Take 1 tablet by mouth every 4 (four) hours as needed for severe pain.   sulfamethoxazole-trimethoprim 800-160 MG tablet Commonly known as:  BACTRIM DS,SEPTRA DS Take 1 tablet by mouth daily.       Allergies  Allergen Reactions  . Peanut-Containing Drug  Products Anaphylaxis    Consultations:  General surgery   Procedures/Studies: Ct  Pelvis W Contrast  Result Date: 10/15/2017 CLINICAL DATA:  Right buttock abscess. EXAM: CT PELVIS WITH CONTRAST TECHNIQUE: Multidetector CT imaging of the pelvis was performed using the standard protocol following the bolus administration of intravenous contrast. CONTRAST:  ISOVUE-300 IOPAMIDOL (ISOVUE-300) INJECTION 61% COMPARISON:  Abdominopelvic CT 06/24/2012 FINDINGS: Urinary Tract: Distal ureters are nondistended. Mild bladder wall thickening inferiorly. No intravesicular air. Bowel: Diffuse rectal wall thickening. Right perirectal fluid collection with peripheral enhancement and internal air measures 4.4 x 2.6 x 4.3 cm. Inflammatory changes track along the right gluteal crease. Mild soft tissue induration tracks from the left lateral rectum to the gluteal fold with some surrounding stranding and small internal foci of air. This measures approximately 4.4 x 1.5 x 3.1 cm. Remaining pelvic bowel loops are normal. Normal appendix. Vascular/Lymphatic: Prominent left iliac node measures 15 mm. Prominent left external iliac node measures 13 mm. Additional smaller bilateral external iliac and inguinal nodes. No acute vascular findings. Reproductive:  Prostate gland is unremarkable. Other:  No free air in the pelvis. Musculoskeletal: There are no acute or suspicious osseous abnormalities. IMPRESSION: 1. Right perirectal abscess measures 4.4 x 2.6 x 4.3 cm. 2. Soft tissue induration tracking from the left lateral rectum with small focus of internal air, suspicious for left perirectal abscess measuring 4.4 x 1.5 x 3.1 cm. 3. Mild bladder wall thickening at the bladder base of unknown significance. Electronically Signed   By: Rubye Oaks M.D.   On: 10/15/2017 03:52         Discharge Exam: Vitals:   10/17/17 0614 10/17/17 1442  BP: 127/89 111/75  Pulse: 67 63  Resp: 20 18  Temp: 98.4 F (36.9 C) 98.6 F (37 C)  SpO2: 100% 97%   Vitals:   10/16/17 1806 10/16/17 2247 10/17/17 0614 10/17/17  1442  BP: (!) 142/84 (!) 143/88 127/89 111/75  Pulse: 83 74 67 63  Resp: 18 20 20 18   Temp: 99.2 F (37.3 C) 99.2 F (37.3 C) 98.4 F (36.9 C) 98.6 F (37 C)  TempSrc: Oral Oral Oral Oral  SpO2: 99% 100% 100% 97%  Weight:      Height:        General: Pt is alert, awake, not in acute distress Cardiovascular: RRR, S1/S2 +, no rubs, no gallops Respiratory: CTA bilaterally, no wheezing, no rhonchi Abdominal: Soft, NT, ND, bowel sounds + Extremities: no edema, no cyanosis   The results of significant diagnostics from this hospitalization (including imaging, microbiology, ancillary and laboratory) are listed below for reference.    Significant Diagnostic Studies: Ct Pelvis W Contrast  Result Date: 10/15/2017 CLINICAL DATA:  Right buttock abscess. EXAM: CT PELVIS WITH CONTRAST TECHNIQUE: Multidetector CT imaging of the pelvis was performed using the standard protocol following the bolus administration of intravenous contrast. CONTRAST:  ISOVUE-300 IOPAMIDOL (ISOVUE-300) INJECTION 61% COMPARISON:  Abdominopelvic CT 06/24/2012 FINDINGS: Urinary Tract: Distal ureters are nondistended. Mild bladder wall thickening inferiorly. No intravesicular air. Bowel: Diffuse rectal wall thickening. Right perirectal fluid collection with peripheral enhancement and internal air measures 4.4 x 2.6 x 4.3 cm. Inflammatory changes track along the right gluteal crease. Mild soft tissue induration tracks from the left lateral rectum to the gluteal fold with some surrounding stranding and small internal foci of air. This measures approximately 4.4 x 1.5 x 3.1 cm. Remaining pelvic bowel loops are normal. Normal appendix. Vascular/Lymphatic: Prominent left iliac node measures 15 mm. Prominent  left external iliac node measures 13 mm. Additional smaller bilateral external iliac and inguinal nodes. No acute vascular findings. Reproductive:  Prostate gland is unremarkable. Other:  No free air in the pelvis.  Musculoskeletal: There are no acute or suspicious osseous abnormalities. IMPRESSION: 1. Right perirectal abscess measures 4.4 x 2.6 x 4.3 cm. 2. Soft tissue induration tracking from the left lateral rectum with small focus of internal air, suspicious for left perirectal abscess measuring 4.4 x 1.5 x 3.1 cm. 3. Mild bladder wall thickening at the bladder base of unknown significance. Electronically Signed   By: Rubye OaksMelanie  Ehinger M.D.   On: 10/15/2017 03:52     Microbiology: Recent Results (from the past 240 hour(s))  Surgical pcr screen     Status: Abnormal   Collection Time: 10/15/17  4:55 PM  Result Value Ref Range Status   MRSA, PCR NEGATIVE NEGATIVE Final   Staphylococcus aureus POSITIVE (A) NEGATIVE Final    Comment: (NOTE) The Xpert SA Assay (FDA approved for NASAL specimens in patients 32 years of age and older), is one component of a comprehensive surveillance program. It is not intended to diagnose infection nor to guide or monitor treatment. Performed at [facility], [address] [place], [place] [address]      Labs: Basic Metabolic Panel: Recent Labs  Lab 10/15/17 0239 10/16/17 0855 10/17/17 0546  NA 132* 132* 132*  K 3.3* 3.7 3.8  CL 98 99 99  CO2 26 27 26   GLUCOSE 100* 102* 84  BUN 8 8 9   CREATININE 1.24 1.00 0.98  CALCIUM 8.6* 8.5* 8.5*  MG  --  1.8  --    Liver Function Tests: No results for input(s): AST, ALT, ALKPHOS, BILITOT, PROT, ALBUMIN in the last 168 hours. No results for input(s): LIPASE, AMYLASE in the last 168 hours. No results for input(s): AMMONIA in the last 168 hours. CBC: Recent Labs  Lab 10/15/17 0239 10/16/17 0855 10/17/17 0546  WBC 8.7 9.8 7.1  NEUTROABS 6.6  --   --   HGB 11.7* 11.4* 11.0*  HCT 33.0* 32.1* 31.5*  MCV 84.0 83.4 83.6  PLT 193 181 157   Cardiac Enzymes: No results for input(s): CKTOTAL, CKMB, CKMBINDEX, TROPONINI in the last 168 hours. BNP: Invalid input(s): POCBNP CBG: No results for input(s): GLUCAP in the  last 168 hours.  Time coordinating discharge:  36 minutes  Signed:  Catarina Hartshornavid Jaydee Conran, DO [facility] Pager: [phone] 10/17/2017, 5:22 PM

## 2017-10-17 NOTE — Care Management Note (Signed)
Case Management Note  Patient Details  Name: Phillip Morales MRN: [id number] Date of Birth: 05/13/1985  Subjective/Objective:      Admitted with abcess. Pt from home, ind. Pt has no insurance, no PCP, will f/u with ID in [place]. Pt has HIV and has difficulty getting to appointment in [place].                Action/Plan: Pt given MATCH for assistance with cost of abx. Pt given information for [facility] Care [facility]iders for HIV case management and potential assistance finding transportation to appointments. DC home today with self care.   Expected Discharge Date:  10/17/17               Expected Discharge Plan:  Home/Self Care  In-House Referral:  NA  Discharge planning Services  CM Consult, MATCH Program, Other - See comment  Post Acute Care Choice:  NA Choice offered to:  NA  Status of Service:  Completed, signed off  If discussed at Long Length of Stay Meetings, dates discussed:    Additional Comments:  Malcolm MetroChildress, [place]clane Arora Demske, RN 10/17/2017, 4:50 PM

## 2017-10-27 ENCOUNTER — Other Ambulatory Visit: Payer: Self-pay

## 2017-10-27 ENCOUNTER — Other Ambulatory Visit: Payer: Self-pay | Admitting: Pharmacist

## 2017-10-27 DIAGNOSIS — B20 Human immunodeficiency virus [HIV] disease: Secondary | ICD-10-CM

## 2017-10-27 DIAGNOSIS — Z113 Encounter for screening for infections with a predominantly sexual mode of transmission: Secondary | ICD-10-CM

## 2017-10-28 LAB — T-HELPER CELL (CD4) - (RCID CLINIC ONLY)
CD4 T CELL ABS: 200 /uL — AB (ref 400–2700)
CD4 T CELL HELPER: 12 % — AB (ref 33–55)

## 2017-10-31 LAB — HIV-1 RNA QUANT-NO REFLEX-BLD
HIV 1 RNA QUANT: 20000 {copies}/mL — AB
HIV-1 RNA Quant, Log: 4.3 Log copies/mL — ABNORMAL HIGH

## 2017-10-31 LAB — EXTRA

## 2017-10-31 LAB — COMPLETE METABOLIC PANEL WITH GFR
AG Ratio: 0.6 (calc) — ABNORMAL LOW (ref 1.0–2.5)
ALT: 13 U/L (ref 9–46)
AST: 19 U/L (ref 10–40)
Albumin: 3.5 g/dL — ABNORMAL LOW (ref 3.6–5.1)
Alkaline phosphatase (APISO): 63 U/L (ref 40–115)
BUN: 10 mg/dL (ref 7–25)
CALCIUM: 9.1 mg/dL (ref 8.6–10.3)
CHLORIDE: 102 mmol/L (ref 98–110)
CO2: 28 mmol/L (ref 20–32)
Creat: 1.14 mg/dL (ref 0.60–1.35)
GFR, EST NON AFRICAN AMERICAN: 85 mL/min/{1.73_m2} (ref >=60)
GFR, Est African American: 98 mL/min/{1.73_m2} (ref >=60)
GLUCOSE: 82 mg/dL (ref 65–99)
Globulin: 6.2 g/dL (calc) — ABNORMAL HIGH (ref 1.9–3.7)
Potassium: 4.1 mmol/L (ref 3.5–5.3)
Sodium: 135 mmol/L (ref 135–146)
TOTAL PROTEIN: 9.7 g/dL — AB (ref 6.1–8.1)
Total Bilirubin: 0.2 mg/dL (ref 0.2–1.2)

## 2017-10-31 LAB — CBC WITH DIFFERENTIAL/PLATELET
BASOS ABS: 19 {cells}/uL (ref 0–200)
BASOS PCT: 0.5 %
EOS PCT: 1.9 %
Eosinophils Absolute: 72 cells/uL (ref 15–500)
HEMATOCRIT: 33.6 % — AB (ref 38.5–50.0)
HEMOGLOBIN: 11.2 g/dL — AB (ref 13.2–17.1)
LYMPHS ABS: 1512 {cells}/uL (ref 850–3900)
MCH: 28.2 pg (ref 27.0–33.0)
MCHC: 33.3 g/dL (ref 32.0–36.0)
MCV: 84.6 fL (ref 80.0–100.0)
MPV: 10.6 fL (ref 7.5–12.5)
Monocytes Relative: 12.5 %
NEUTROS ABS: 1721 {cells}/uL (ref 1500–7800)
Neutrophils Relative %: 45.3 %
Platelets: 213 10*3/uL (ref 140–400)
RBC: 3.97 10*6/uL — ABNORMAL LOW (ref 4.20–5.80)
RDW: 13.4 % (ref 11.0–15.0)
Total Lymphocyte: 39.8 %
WBC mixed population: 475 cells/uL (ref 200–950)
WBC: 3.8 10*3/uL (ref 3.8–10.8)

## 2017-10-31 LAB — RPR: RPR Ser Ql: REACTIVE — AB

## 2017-10-31 LAB — FLUORESCENT TREPONEMAL AB(FTA)-IGG-BLD: Fluorescent Treponemal ABS: REACTIVE — AB

## 2017-10-31 LAB — RPR TITER: RPR Titer: 1:4 {titer} — ABNORMAL HIGH

## 2017-11-03 ENCOUNTER — Encounter: Payer: Self-pay | Admitting: Internal Medicine

## 2017-11-10 ENCOUNTER — Encounter: Payer: Self-pay | Admitting: Internal Medicine

## 2017-11-10 ENCOUNTER — Ambulatory Visit (INDEPENDENT_AMBULATORY_CARE_PROVIDER_SITE_OTHER): Payer: Self-pay | Admitting: Internal Medicine

## 2017-11-10 VITALS — BP 137/87 | HR 76 | Temp 98.1°F | Ht 68.0 in | Wt 190.0 lb

## 2017-11-10 DIAGNOSIS — Z8619 Personal history of other infectious and parasitic diseases: Secondary | ICD-10-CM

## 2017-11-10 DIAGNOSIS — B20 Human immunodeficiency virus [HIV] disease: Secondary | ICD-10-CM

## 2017-11-10 DIAGNOSIS — Z113 Encounter for screening for infections with a predominantly sexual mode of transmission: Secondary | ICD-10-CM

## 2017-11-10 DIAGNOSIS — Z23 Encounter for immunization: Secondary | ICD-10-CM

## 2017-11-10 MED ORDER — BICTEGRAVIR-EMTRICITAB-TENOFOV 50-200-25 MG PO TABS: 1.0000 | ORAL_TABLET | Freq: Every day | ORAL | 11 refills | Status: DC

## 2017-11-10 NOTE — Progress Notes (Signed)
Prevnar-13 and menveo administered this visit. Patient tolerated well. No concerns or questions voiced.  Braxtin Bamba S. LPN

## 2017-11-10 NOTE — Progress Notes (Signed)
Patient ID: Phillip Morales, male   DOB: 07/24/1985, 32 y.o.   MRN: [id number]  Patient ID: Phillip Morales, male    DOB: 22-Nov-1985, 32 y.o.   MRN: [id number]  Reason for visit: to establish care as a new patient with established HIV  HPI:   He previously was seen here but has not been here in over 3 years.  Patient was first diagnosed in August 2014 while in jail and started then on Prezista, norvir and Truvada.  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    He was put on Stribild then and transitioned to Mulberry which he took until 2016 and fell out of care.  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  The CD4 count is 200, viral load 20,000.  There have been no associated symptoms.  He does have another perirectal abscess and was recently hospitalized.  He is having some urinary difficulty and associated tenesmus.    Past Medical History:  Diagnosis Date  . Asthma   . HIV (human immunodeficiency virus infection) (HCC)   . Hypertension     Prior to Admission medications   Medication Sig Start Date End Date Taking? Authorizing Provider  amLODipine (NORVASC) 5 MG tablet Take 1 tablet (5 mg total) by mouth daily. 07/09/17  Yes Mancel Bale, MD  amoxicillin-clavulanate (AUGMENTIN) 875-125 MG tablet Take 1 tablet by mouth every 12 (twelve) hours. 10/17/17  Yes Tat, Onalee Hua, MD  doxycycline (VIBRA-TABS) 100 MG tablet Take 1 tablet (100 mg total) by mouth every 12 (twelve) hours. 10/17/17  Yes Tat, Onalee Hua, MD  sulfamethoxazole-trimethoprim (BACTRIM DS,SEPTRA DS) 800-160 MG tablet Take 1 tablet by mouth daily. 10/17/17  Yes Tat, Onalee Hua, MD  GENVOYA 150-150-200-10 MG TABS tablet TAKE 1 TABLET BY MOUTH DAILY WITH BREAKFAST Patient not taking: Reported on 11/10/2017 08/06/15   Gardiner Barefoot, MD  oxyCODONE-acetaminophen (PERCOCET) 5-325 MG tablet Take 1 tablet by mouth every 4 (four) hours as needed for severe pain. Patient not  taking: Reported on 11/10/2017 10/17/17   Catarina Hartshorn, MD    Allergies  Allergen Reactions  . Peanut-Containing Drug Products Anaphylaxis    Social History   Tobacco Use  . Smoking status: Current Some Day Smoker    Packs/day: 0.10    Types: Cigarettes    Start date: 03/03/2013  . Smokeless tobacco: Never Used  Substance Use Topics  . Alcohol use: Not Currently    Alcohol/week: 0.0 standard drinks    Comment: social  . Drug use: Not Currently    Types: Marijuana    Comment: 2 days ago    Family History  Problem Relation Age of Onset  . Kidney disease Neg Hx      Review of Systems Constitutional: negative for fevers, chills, fatigue, malaise, anorexia and weight loss Respiratory: negative for cough Gastrointestinal: negative for nausea and diarrhea Integument/breast: negative for rash Musculoskeletal: negative for myalgias and arthralgias All other systems reviewed and are negative    CONSTITUTIONAL:in no apparent distress and alert  Vitals:   11/10/17 1039  BP: 137/87  Pulse: 76  Temp: 98.1 F (36.7 C)   EYES: anicteric HENT: no thrush CARD:Cor RRR RESP:CTA B; normal respiratory effort VQ:QVZDG sounds are normal, liver is not enlarged, spleen is not enlarged MS:no pedal edema noted SKIN: no lesions, rashes NEURO: non-focal  Lab Results  Component Value Date   HIV1RNAQUANT 20,000 (H) 10/27/2017  HIV1RNAQUANT CANCELED 10/27/2017   HIV1RNAQUANT 9,450 10/15/2017   No components found for: HIV1GENOTYPRPLUS No components found for: THELPERCELL  Assessment: new patient visit after falling out of care with established HIV.  Reminded patient of treatment options and side effects, benefits of treatment, long term outcomes.  I discussed the severity of untreated HIV including higher cancer risk, opportunistic infections, renal failure.  Also discussed needing to use condoms, partner disclosure, necessary vaccines, blood monitoring.  All questions answered.   HMAP  approved and active  Plan: 1) start Biktarvy 2) labs in 4 weeks 3) follow up with me in 5 weeks 4) Prevnar, Menveo today 5) consider HPV series next visit 6)CD4 200 so no indication for OI prophylaxis since he will start medicine today 7) RPR 1:4 with previous disease, no new risks reported.  No indication for treatment (is lower than previous) 8) screen with swabs, urine today

## 2017-11-10 NOTE — Addendum Note (Signed)
Addended by: Valarie ConesSTALEY, Gola Bribiesca on: 11/10/2017 12:15 PM   Modules accepted: Orders

## 2017-11-10 NOTE — Assessment & Plan Note (Signed)
Will swab oral and perirectally and urine

## 2017-11-11 LAB — CYTOLOGY, (ORAL, ANAL, URETHRAL) ANCILLARY ONLY
Chlamydia: NEGATIVE
Chlamydia: NEGATIVE
Neisseria Gonorrhea: NEGATIVE
Neisseria Gonorrhea: NEGATIVE

## 2017-11-11 LAB — URINE CYTOLOGY ANCILLARY ONLY
CHLAMYDIA, DNA PROBE: NEGATIVE
NEISSERIA GONORRHEA: NEGATIVE

## 2017-12-05 ENCOUNTER — Other Ambulatory Visit: Payer: Self-pay

## 2017-12-19 ENCOUNTER — Encounter: Payer: Self-pay | Admitting: Internal Medicine

## 2018-02-16 ENCOUNTER — Other Ambulatory Visit: Payer: Self-pay

## 2018-02-16 ENCOUNTER — Emergency Department (HOSPITAL_COMMUNITY)
Admission: EM | Admit: 2018-02-16 | Discharge: 2018-02-16 | Disposition: A | Discharge status: Home / Self Care | Payer: Self-pay | Attending: Emergency Medicine | Admitting: Emergency Medicine

## 2018-02-16 ENCOUNTER — Encounter (HOSPITAL_COMMUNITY): Payer: Self-pay

## 2018-02-16 DIAGNOSIS — Z21 Asymptomatic human immunodeficiency virus [HIV] infection status: Secondary | ICD-10-CM | POA: Insufficient documentation

## 2018-02-16 DIAGNOSIS — Z79899 Other long term (current) drug therapy: Secondary | ICD-10-CM | POA: Insufficient documentation

## 2018-02-16 DIAGNOSIS — J45909 Unspecified asthma, uncomplicated: Secondary | ICD-10-CM | POA: Insufficient documentation

## 2018-02-16 DIAGNOSIS — K611 Rectal abscess: Secondary | ICD-10-CM | POA: Insufficient documentation

## 2018-02-16 DIAGNOSIS — I10 Essential (primary) hypertension: Secondary | ICD-10-CM | POA: Insufficient documentation

## 2018-02-16 DIAGNOSIS — F17210 Nicotine dependence, cigarettes, uncomplicated: Secondary | ICD-10-CM | POA: Insufficient documentation

## 2018-02-16 MED ORDER — LIDOCAINE-EPINEPHRINE (PF) 1 %-1:200000 IJ SOLN
10.0000 mL | Freq: Once | INTRAMUSCULAR | Status: DC
  Filled 2018-02-16: qty 30

## 2018-02-16 MED ORDER — HYDROCODONE-ACETAMINOPHEN 5-325 MG PO TABS: 1.0000 | ORAL_TABLET | Freq: Four times a day (QID) | ORAL | 0 refills | Status: DC | PRN

## 2018-02-16 MED ORDER — SODIUM CHLORIDE 0.9 % IV SOLN
1.0000 g | Freq: Once | INTRAVENOUS | Status: AC
  Administered 2018-02-16: 1 g via INTRAVENOUS
  Filled 2018-02-16: qty 10

## 2018-02-16 MED ORDER — KETAMINE HCL 10 MG/ML IJ SOLN
1.0000 mg/kg | Freq: Once | INTRAMUSCULAR | Status: AC
  Administered 2018-02-16: 89 mg via INTRAVENOUS
  Filled 2018-02-16: qty 1

## 2018-02-16 MED ORDER — AMOXICILLIN-POT CLAVULANATE 875-125 MG PO TABS: 1.0000 | ORAL_TABLET | Freq: Two times a day (BID) | ORAL | 0 refills | Status: DC

## 2018-02-16 MED ORDER — POVIDONE-IODINE 10 % EX SOLN
CUTANEOUS | Status: AC
  Filled 2018-02-16: qty 15

## 2018-02-16 MED ORDER — FENTANYL CITRATE (PF) 100 MCG/2ML IJ SOLN
100.0000 ug | Freq: Once | INTRAMUSCULAR | Status: AC
  Administered 2018-02-16: 100 ug via INTRAVENOUS
  Filled 2018-02-16: qty 2

## 2018-02-16 MED ORDER — MIDAZOLAM HCL 2 MG/2ML IJ SOLN
2.0000 mg | Freq: Once | INTRAMUSCULAR | Status: AC
  Administered 2018-02-16: 2 mg via INTRAVENOUS
  Filled 2018-02-16: qty 2

## 2018-02-16 MED ORDER — AMLODIPINE BESYLATE 5 MG PO TABS: 5.0000 mg | ORAL_TABLET | Freq: Every day | ORAL | 2 refills | Status: DC

## 2018-02-16 NOTE — ED Triage Notes (Signed)
Pt presents to ED with abscess on left buttocks which started 3 days ago. Pt states he has used Herbalistitz bath for it. Pt states it started draining on its own last night about 1900. Pt denies fever.

## 2018-02-16 NOTE — Discharge Instructions (Signed)
Your infection has been drained, we have started you on antibiotics Please continues to take Augmentin twice a day for the next 7 days Please follow-up with a general surgeon, I have given you the phone number for [facility] The surgical group takes care of colorectal problems such as yours and would be the best option going forward to have this definitively managed Return to the emergency department for increasing pain vomiting or fever You may take the pain medications exactly as prescribed and I would strongly recommend that you start taking your blood pressure medication again, I have given you a refill of that today.

## 2018-02-16 NOTE — ED Notes (Signed)
ED Provider at bedside. 

## 2018-02-16 NOTE — Sedation Documentation (Signed)
Patient denies pain and is resting comfortably.  

## 2018-02-16 NOTE — ED Provider Notes (Signed)
[facility] EMERGENCY DEPARTMENT Provider Note   CSN: [id number] Arrival date & time: 02/16/18  1010     History   Chief Complaint Chief Complaint  Patient presents with  . Abscess    HPI Phillip Morales is a 32 y.o. male.  HPI  The patient is a 32 year old male, he has a known history of HIV, he is followed by Dr. Luciana Axe with infectious disease.  He was initially diagnosed in August 2014, started on medications, has been treated with Bicillin for syphilis in the past, his CD4 count was 200 and he has had recurrent perirectal abscesses, most recently admitted from the third to 5 August.  He was taken to the operating room but prior to initiating the procedure the patient had spontaneously drained the abscess, he was given vancomycin and Unasyn, improved and was discharged home on Augmentin and doxycycline.  He has been fine until the last several days ago and approximately 4 days ago he developed recurrent pain and swelling in the left buttock, no fevers or chills.  The patient states that he is taking his antivirals and not missing his medications, he denies being on antibacterials.  The patient denies any other symptoms including abdominal pain diarrhea rectal bleeding coughing fevers chills nausea vomiting headache blurred vision or any other symptoms.  Past Medical History:  Diagnosis Date  . Asthma   . HIV (human immunodeficiency virus infection) (HCC)   . Hypertension     Patient Active Problem List   Diagnosis Date Noted  . Screening examination for sexually transmitted disease 11/10/2017  . Need for prophylactic vaccination against Streptococcus pneumoniae (pneumococcus) 11/10/2017  . Abscess of buttock 10/15/2017  . Perirectal abscess 10/15/2017  . Vitamin D deficiency 05/27/2014  . 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 Essential hypertension 12/25/2013  . Tobacco dependence 12/25/2013  . History of MRSA infection 02/16/2013  . Essential hypertension, benign 02/16/2013  .  History of syphilis 02/16/2013  . Human immunodeficiency virus (HIV) disease (HCC) 10/24/2012  . Anemia 10/23/2012  . Gluteal abscess 10/22/2012    History reviewed. No pertinent surgical history.      Home Medications    Prior to Admission medications   Medication Sig Start Date End Date Taking? Authorizing Provider  bictegravir-emtricitabine-tenofovir AF (BIKTARVY) 50-200-25 MG TABS tablet Take 1 tablet by mouth daily. 11/10/17  Yes Comer, Belia Heman, MD  amLODipine (NORVASC) 5 MG tablet Take 1 tablet (5 mg total) by mouth daily. 02/16/18   Eber Hong, MD  amoxicillin-clavulanate (AUGMENTIN) 875-125 MG tablet Take 1 tablet by mouth every 12 (twelve) hours. 02/16/18   Eber Hong, MD  HYDROcodone-acetaminophen (NORCO/VICODIN) 5-325 MG tablet Take 1 tablet by mouth every 6 (six) hours as needed. 02/16/18   Eber Hong, MD    Family History Family History  Problem Relation Age of Onset  . Kidney disease Neg Hx     Social History Social History   Tobacco Use  . Smoking status: Current Some Day Smoker    Packs/day: 1.00    Types: Cigarettes    Start date: 03/03/2013  . Smokeless tobacco: Never Used  Substance Use Topics  . Alcohol use: Not Currently    Alcohol/week: 0.0 standard drinks    Comment: social  . Drug use: Not Currently    Types: Marijuana    Comment: 2 days ago     Allergies   Peanut-containing drug products   Review of Systems Review of Systems  All other systems reviewed and are negative.  Physical Exam Updated Vital Signs BP (!) 146/100   Pulse 62   Temp 98.4 F (36.9 C) (Oral)   Resp 17   Ht 1.702 m (5\' 7" )   Wt 88.5 kg   SpO2 100%   BMI 30.54 kg/m   Physical Exam  Constitutional: He appears well-developed and well-nourished. No distress.  HENT:  Head: Normocephalic and atraumatic.  Mouth/Throat: Oropharynx is clear and moist. No oropharyngeal exudate.  Eyes: Pupils are equal, round, and reactive to light. Conjunctivae and EOM  are normal. Right eye exhibits no discharge. Left eye exhibits no discharge. No scleral icterus.  Neck: Normal range of motion. Neck supple. No JVD present. No thyromegaly present.  Cardiovascular: Normal rate, regular rhythm, normal heart sounds and intact distal pulses. Exam reveals no gallop and no friction rub.  No murmur heard. Pulmonary/Chest: Effort normal and breath sounds normal. No respiratory distress. He has no wheezes. He has no rales.  Abdominal: Soft. Bowel sounds are normal. He exhibits no distension and no mass. There is no tenderness.  Genitourinary:  Genitourinary Comments: The patient has a large area of induration and fluctuance of the left buttock, this is not in the perianal area, it is further out laterally.  There is an area of spontaneous drainage.  Musculoskeletal: Normal range of motion. He exhibits no edema or tenderness.  Lymphadenopathy:    He has no cervical adenopathy.  Neurological: He is alert. Coordination normal.  Skin: Skin is warm and dry. No rash noted. No erythema.  Psychiatric: He has a normal mood and affect. His behavior is normal.  Nursing note and vitals reviewed.    ED Treatments / Results  Labs (all labs ordered are listed, but only abnormal results are displayed) Labs Reviewed - No data to display  EKG None  Radiology No results found.  Procedures .Marland Kitchen.Incision and Drainage Date/Time: 02/16/2018 4:15 PM Performed by: Eber HongMiller, Lilee Aldea, MD Authorized by: Eber HongMiller, Antonios Ostrow, MD   Consent:    Consent obtained:  Verbal   Consent given by:  Patient   Risks discussed:  Bleeding, damage to other organs, incomplete drainage, infection and pain   Alternatives discussed:  Delayed treatment Location:    Type:  Abscess   Location:  Anogenital   Anogenital location:  Perirectal Pre-procedure details:    Skin preparation:  Betadine Sedation:    Sedation type:  Deep Anesthesia (see MAR for exact dosages):    Anesthesia method:  Local  infiltration   Local anesthetic:  Lidocaine 1% WITH epi Procedure type:    Complexity:  Complex Procedure details:    Needle aspiration: no     Incision types:  Single straight   Incision depth:  Dermal   Scalpel blade:  11   Wound management:  Probed and deloculated, irrigated with saline and extensive cleaning   Drainage:  Purulent and bloody   Drainage amount:  Copious   Wound treatment:  Wound left open   Packing materials:  1/2 in gauze Post-procedure details:    Patient tolerance of procedure:  Tolerated well, no immediate complications Comments:       .Sedation Date/Time: 02/16/2018 4:16 PM Performed by: Eber HongMiller, Norris Bodley, MD Authorized by: Eber HongMiller, Haruye Lainez, MD   Consent:    Consent obtained:  Written and verbal   Consent given by:  Patient   Risks discussed:  Allergic reaction, dysrhythmia, inadequate sedation, nausea, prolonged hypoxia resulting in organ damage, prolonged sedation necessitating reversal, respiratory compromise necessitating ventilatory assistance and intubation and vomiting   Alternatives discussed:  Analgesia without sedation, anxiolysis and regional anesthesia Universal protocol:    Procedure explained and questions answered to patient or proxy's satisfaction: yes     Relevant documents present and verified: yes     Test results available and properly labeled: yes     Imaging studies available: yes     Required blood products, implants, devices, and special equipment available: yes     Site/side marked: yes     Immediately prior to procedure a time out was called: yes     Patient identity confirmation method:  Verbally with patient Indications:    Procedure performed:  Incision and drainage   Procedure necessitating sedation performed by:  Physician performing sedation Pre-sedation assessment:    Time since last food or drink:  2   ASA classification: class 2 - patient with mild systemic disease     Neck mobility: normal     Mouth opening:  3 or more  finger widths   Thyromental distance:  4 finger widths   Mallampati score:  I - soft palate, uvula, fauces, pillars visible   Pre-sedation assessments completed and reviewed: airway patency, cardiovascular function, hydration status, mental status, nausea/vomiting, pain level, respiratory function and temperature     Pre-sedation assessment completed:  02/16/2018 3:55 PM Immediate pre-procedure details:    Reassessment: Patient reassessed immediately prior to procedure     Reviewed: vital signs, relevant labs/tests and NPO status     Verified: bag valve mask available, emergency equipment available, intubation equipment available, IV patency confirmed, oxygen available and suction available   Procedure details (see MAR for exact dosages):    Preoxygenation:  Nasal cannula   Sedation:  Propofol   Intra-procedure monitoring:  Blood pressure monitoring, cardiac monitor, continuous pulse oximetry, frequent LOC assessments, frequent vital sign checks and continuous capnometry   Intra-procedure events: none     Total Provider sedation time (minutes):  19 Post-procedure details:    Post-sedation assessment completed:  02/16/2018 4:20 PM   Attendance: Constant attendance by certified staff until patient recovered     Recovery: Patient returned to pre-procedure baseline     Post-sedation assessments completed and reviewed: airway patency, cardiovascular function, hydration status, mental status, nausea/vomiting, pain level, respiratory function and temperature     Patient is stable for discharge or admission: yes     Patient tolerance:  Tolerated well, no immediate complications Comments:     Returned to baseline without difficulty    (including critical care time)  Medications Ordered in ED Medications  lidocaine-EPINEPHrine (XYLOCAINE-EPINEPHrine) 1 %-1:200000 (PF) injection 10 mL (has no administration in time range)  povidone-iodine (BETADINE) 10 % external solution (has no administration in  time range)  ketamine (KETALAR) injection 89 mg (89 mg Intravenous Given by Other 02/16/18 1602)  midazolam (VERSED) injection 2 mg (2 mg Intravenous Given by Other 02/16/18 1600)  fentaNYL (SUBLIMAZE) injection 100 mcg (100 mcg Intravenous Given by Other 02/16/18 1557)  cefTRIAXone (ROCEPHIN) 1 g in sodium chloride 0.9 % 100 mL IVPB (0 g Intravenous Stopped 02/16/18 1658)     Initial Impression / Assessment and Plan / ED Course  I have reviewed the triage vital signs and the nursing notes.  Pertinent labs & imaging results that were available during my care of the patient were reviewed by me and considered in my medical decision making (see chart for details).  Clinical Course as of Feb 17 1716  Thu Feb 16, 2018  1458 Care was discussed with Dr. Ninetta Lights who agrees with  going forward with incision and drainage, local antibiotics and follow-up as he does not appear septic.  He recommends against MRSA coverage and at this time using Augmentin or Cipro and Flagyl.   [BM]    Clinical Course User Index [BM] Eber Hong, MD    Patient given Rocephin, will discharge on Augmentin, vital signs remained stable, he was slightly hypertensive during the procedure but not surprising given ketamine use.  Discussed case with Gen Surg Dr. Henreitta Leber who recommends referral to [facility] for colorectal surgical follow up.  Final Clinical Impressions(s) / ED Diagnoses   Final diagnoses:  Perirectal abscess    ED Discharge Orders         Ordered    amoxicillin-clavulanate (AUGMENTIN) 875-125 MG tablet  Every 12 hours     02/16/18 1714    amLODipine (NORVASC) 5 MG tablet  Daily    Note to Pharmacy:  The patient needs to make and keep MD and Lab work appointments.  He also needs to renew his ADAP now.  Thank you.   02/16/18 1714    HYDROcodone-acetaminophen (NORCO/VICODIN) 5-325 MG tablet  Every 6 hours PRN     02/16/18 1714           Eber Hong, MD 02/16/18 1717

## 2018-04-03 ENCOUNTER — Other Ambulatory Visit: Payer: Self-pay

## 2018-04-18 ENCOUNTER — Encounter: Payer: Self-pay | Admitting: Internal Medicine

## 2018-06-27 ENCOUNTER — Telehealth: Payer: Self-pay

## 2018-06-27 NOTE — Telephone Encounter (Signed)
Phillip Morales has missed multiple appointments. Called Phillip Morales in hopes to make appointment to continue care.  Left message to give office a call.      Phillip Morales, [place]

## 2018-06-30 ENCOUNTER — Telehealth: Payer: Self-pay | Admitting: Internal Medicine

## 2018-06-30 NOTE — Telephone Encounter (Signed)
COVID-19 Pre-Screening Questions:06/30/18 ° °Do you currently have a fever (>100 °F), chills or unexplained body aches?NO ° °Are you currently experiencing new cough, shortness of breath, sore throat, runny nose? NO °•  °Have you recently travelled outside the state of  in the last 14 days? NO °•  °Have you been in contact with someone that is currently pending confirmation of Covid19 testing or has been confirmed to have the Covid19 virus?  NO ° °**If the patient answers NO to ALL questions -  advise the patient to please call the clinic before coming to the office should any symptoms develop.  ° ° ° °

## 2018-07-03 ENCOUNTER — Ambulatory Visit: Payer: Self-pay

## 2018-07-03 ENCOUNTER — Other Ambulatory Visit: Payer: Self-pay

## 2018-08-01 ENCOUNTER — Ambulatory Visit: Payer: Self-pay

## 2018-08-01 ENCOUNTER — Ambulatory Visit (INDEPENDENT_AMBULATORY_CARE_PROVIDER_SITE_OTHER): Payer: Self-pay | Admitting: Internal Medicine

## 2018-08-01 ENCOUNTER — Encounter: Payer: Self-pay | Admitting: Internal Medicine

## 2018-08-01 ENCOUNTER — Other Ambulatory Visit: Payer: Self-pay

## 2018-08-01 VITALS — BP 187/124 | HR 98 | Temp 98.4°F | Wt 201.0 lb

## 2018-08-01 DIAGNOSIS — I10 Essential (primary) hypertension: Secondary | ICD-10-CM

## 2018-08-01 DIAGNOSIS — Z8619 Personal history of other infectious and parasitic diseases: Secondary | ICD-10-CM

## 2018-08-01 DIAGNOSIS — B20 Human immunodeficiency virus [HIV] disease: Secondary | ICD-10-CM

## 2018-08-01 DIAGNOSIS — Z9189 Other specified personal risk factors, not elsewhere classified: Secondary | ICD-10-CM

## 2018-08-01 DIAGNOSIS — F17200 Nicotine dependence, unspecified, uncomplicated: Secondary | ICD-10-CM

## 2018-08-01 DIAGNOSIS — Z113 Encounter for screening for infections with a predominantly sexual mode of transmission: Secondary | ICD-10-CM

## 2018-08-01 MED ORDER — SULFAMETHOXAZOLE-TRIMETHOPRIM 800-160 MG PO TABS: 1.0000 | ORAL_TABLET | Freq: Every day | ORAL | 2 refills | Status: DC

## 2018-08-01 MED ORDER — AMLODIPINE BESYLATE 5 MG PO TABS: 5.0000 mg | ORAL_TABLET | Freq: Every day | ORAL | 5 refills | Status: DC

## 2018-08-01 MED ORDER — BICTEGRAVIR-EMTRICITAB-TENOFOV 50-200-25 MG PO TABS: 1.0000 | ORAL_TABLET | Freq: Every day | ORAL | 11 refills | Status: DC

## 2018-08-01 NOTE — Assessment & Plan Note (Signed)
Poorly controlled, will check labs today and start back on Biktarvy.   rtc 4 weeks Will get Bridge counseling

## 2018-08-01 NOTE — Assessment & Plan Note (Signed)
Discussed cessation.  He is motivated.

## 2018-08-01 NOTE — Assessment & Plan Note (Signed)
Will check rpr today

## 2018-08-01 NOTE — Assessment & Plan Note (Signed)
Elevated.  I will restart his amlodipine He will get a PCP

## 2018-08-01 NOTE — Progress Notes (Signed)
   Subjective:    Patient ID: Phillip Morales, male    DOB: 09-15-1985, 33 y.o.   MRN: [id number]  HPI Here for follow up of HIV Last seen in August 2019 and had been off of medication.  Has remained off and has missed many appointments.  Wants to get back on track. Has issues with transportation and stressors with probation and trying to keep on track.  Some tobacco but reducing it.  Some occasional headache.  BP up today.    Review of Systems  Constitutional: Negative for fatigue, fever and unexpected weight change.  Skin: Negative for rash.  Neurological: Positive for headaches. Negative for dizziness.       Objective:   Physical Exam Constitutional:      Appearance: Normal appearance.  Cardiovascular:     Rate and Rhythm: Normal rate and regular rhythm.     Heart sounds: No murmur.  Pulmonary:     Effort: Pulmonary effort is normal. No respiratory distress.     Breath sounds: Normal breath sounds.  Skin:    Findings: No rash.  Neurological:     Mental Status: He is alert.  Psychiatric:        Mood and Affect: Mood normal.   FMH: father, mother with HTN        Assessment & Plan:

## 2018-08-01 NOTE — Assessment & Plan Note (Signed)
I will start him on Bactrim with the probability he has a CD4 < 200

## 2018-08-02 LAB — T-HELPER CELL (CD4) - (RCID CLINIC ONLY)
CD4 % Helper T Cell: 12 % — ABNORMAL LOW (ref 33–65)
CD4 T Cell Abs: 137 /uL — ABNORMAL LOW (ref 400–1790)

## 2018-08-03 LAB — URINE CYTOLOGY ANCILLARY ONLY
Chlamydia: NEGATIVE
Neisseria Gonorrhea: NEGATIVE

## 2018-08-05 LAB — COMPLETE METABOLIC PANEL WITH GFR
AG Ratio: 0.8 (calc) — ABNORMAL LOW (ref 1.0–2.5)
ALT: 21 U/L (ref 9–46)
AST: 35 U/L (ref 10–40)
Albumin: 4.2 g/dL (ref 3.6–5.1)
Alkaline phosphatase (APISO): 59 U/L (ref 36–130)
BUN: 17 mg/dL (ref 7–25)
CO2: 28 mmol/L (ref 20–32)
Calcium: 9.3 mg/dL (ref 8.6–10.3)
Chloride: 103 mmol/L (ref 98–110)
Creat: 1.22 mg/dL (ref 0.60–1.35)
GFR, Est African American: 90 mL/min/{1.73_m2} (ref >=60)
GFR, Est Non African American: 77 mL/min/{1.73_m2} (ref >=60)
Globulin: 5.4 g/dL (calc) — ABNORMAL HIGH (ref 1.9–3.7)
Glucose, Bld: 100 mg/dL — ABNORMAL HIGH (ref 65–99)
Potassium: 4.3 mmol/L (ref 3.5–5.3)
Sodium: 136 mmol/L (ref 135–146)
Total Bilirubin: 0.4 mg/dL (ref 0.2–1.2)
Total Protein: 9.6 g/dL — ABNORMAL HIGH (ref 6.1–8.1)

## 2018-08-05 LAB — CBC WITH DIFFERENTIAL/PLATELET
Absolute Monocytes: 451 cells/uL (ref 200–950)
Basophils Absolute: 19 cells/uL (ref 0–200)
Basophils Relative: 0.5 %
Eosinophils Absolute: 118 cells/uL (ref 15–500)
Eosinophils Relative: 3.2 %
HCT: 37.9 % — ABNORMAL LOW (ref 38.5–50.0)
Hemoglobin: 12.8 g/dL — ABNORMAL LOW (ref 13.2–17.1)
Lymphs Abs: 1180 cells/uL (ref 850–3900)
MCH: 28.2 pg (ref 27.0–33.0)
MCHC: 33.8 g/dL (ref 32.0–36.0)
MCV: 83.5 fL (ref 80.0–100.0)
MPV: 11 fL (ref 7.5–12.5)
Monocytes Relative: 12.2 %
Neutro Abs: 1931 cells/uL (ref 1500–7800)
Neutrophils Relative %: 52.2 %
Platelets: 176 10*3/uL (ref 140–400)
RBC: 4.54 10*6/uL (ref 4.20–5.80)
RDW: 13.2 % (ref 11.0–15.0)
Total Lymphocyte: 31.9 %
WBC: 3.7 10*3/uL — ABNORMAL LOW (ref 3.8–10.8)

## 2018-08-05 LAB — RPR: RPR Ser Ql: REACTIVE — AB

## 2018-08-05 LAB — HIV-1 RNA QUANT-NO REFLEX-BLD
HIV 1 RNA Quant: 62100 copies/mL — ABNORMAL HIGH
HIV-1 RNA Quant, Log: 4.79 Log copies/mL — ABNORMAL HIGH

## 2018-08-05 LAB — FLUORESCENT TREPONEMAL AB(FTA)-IGG-BLD: Fluorescent Treponemal ABS: REACTIVE — AB

## 2018-08-05 LAB — RPR TITER: RPR Titer: 1:4 {titer} — ABNORMAL HIGH

## 2018-08-29 ENCOUNTER — Other Ambulatory Visit: Payer: Self-pay

## 2018-08-29 DIAGNOSIS — B20 Human immunodeficiency virus [HIV] disease: Secondary | ICD-10-CM

## 2018-08-30 LAB — T-HELPER CELL (CD4) - (RCID CLINIC ONLY)
CD4 % Helper T Cell: 13 % — ABNORMAL LOW (ref 33–65)
CD4 T Cell Abs: 161 /uL — ABNORMAL LOW (ref 400–1790)

## 2018-09-02 LAB — HIV-1 RNA QUANT-NO REFLEX-BLD
HIV 1 RNA Quant: <20 copies/mL
HIV-1 RNA Quant, Log: <1.3 Log copies/mL

## 2018-09-02 LAB — COMPREHENSIVE METABOLIC PANEL
AG Ratio: 0.8 (calc) — ABNORMAL LOW (ref 1.0–2.5)
ALT: 35 U/L (ref 9–46)
AST: 29 U/L (ref 10–40)
Albumin: 4.1 g/dL (ref 3.6–5.1)
Alkaline phosphatase (APISO): 66 U/L (ref 36–130)
BUN: 11 mg/dL (ref 7–25)
CO2: 27 mmol/L (ref 20–32)
Calcium: 9.1 mg/dL (ref 8.6–10.3)
Chloride: 105 mmol/L (ref 98–110)
Creat: 1.27 mg/dL (ref 0.60–1.35)
Globulin: 5.1 g/dL (calc) — ABNORMAL HIGH (ref 1.9–3.7)
Glucose, Bld: 101 mg/dL — ABNORMAL HIGH (ref 65–99)
Potassium: 4.2 mmol/L (ref 3.5–5.3)
Sodium: 136 mmol/L (ref 135–146)
Total Bilirubin: 0.3 mg/dL (ref 0.2–1.2)
Total Protein: 9.2 g/dL — ABNORMAL HIGH (ref 6.1–8.1)

## 2018-09-02 LAB — CBC WITH DIFFERENTIAL/PLATELET
Absolute Monocytes: 307 cells/uL (ref 200–950)
Basophils Absolute: 19 cells/uL (ref 0–200)
Basophils Relative: 0.5 %
Eosinophils Absolute: 141 cells/uL (ref 15–500)
Eosinophils Relative: 3.8 %
HCT: 34.9 % — ABNORMAL LOW (ref 38.5–50.0)
Hemoglobin: 11.9 g/dL — ABNORMAL LOW (ref 13.2–17.1)
Lymphs Abs: 1325 cells/uL (ref 850–3900)
MCH: 28.5 pg (ref 27.0–33.0)
MCHC: 34.1 g/dL (ref 32.0–36.0)
MCV: 83.5 fL (ref 80.0–100.0)
MPV: 11.2 fL (ref 7.5–12.5)
Monocytes Relative: 8.3 %
Neutro Abs: 1909 cells/uL (ref 1500–7800)
Neutrophils Relative %: 51.6 %
Platelets: 163 10*3/uL (ref 140–400)
RBC: 4.18 10*6/uL — ABNORMAL LOW (ref 4.20–5.80)
RDW: 14.1 % (ref 11.0–15.0)
Total Lymphocyte: 35.8 %
WBC: 3.7 10*3/uL — ABNORMAL LOW (ref 3.8–10.8)

## 2018-09-27 ENCOUNTER — Encounter: Payer: Self-pay | Admitting: Internal Medicine

## 2018-09-27 ENCOUNTER — Ambulatory Visit: Payer: Self-pay

## 2018-09-27 ENCOUNTER — Ambulatory Visit (INDEPENDENT_AMBULATORY_CARE_PROVIDER_SITE_OTHER): Payer: Self-pay | Admitting: Internal Medicine

## 2018-09-27 ENCOUNTER — Other Ambulatory Visit: Payer: Self-pay

## 2018-09-27 VITALS — BP 133/76 | HR 96 | Temp 98.3°F | Wt 204.0 lb

## 2018-09-27 DIAGNOSIS — B20 Human immunodeficiency virus [HIV] disease: Secondary | ICD-10-CM

## 2018-09-27 DIAGNOSIS — Z113 Encounter for screening for infections with a predominantly sexual mode of transmission: Secondary | ICD-10-CM

## 2018-09-27 DIAGNOSIS — R140 Abdominal distension (gaseous): Secondary | ICD-10-CM

## 2018-09-27 DIAGNOSIS — Z9189 Other specified personal risk factors, not elsewhere classified: Secondary | ICD-10-CM

## 2018-09-27 DIAGNOSIS — F17200 Nicotine dependence, unspecified, uncomplicated: Secondary | ICD-10-CM

## 2018-09-27 DIAGNOSIS — Z23 Encounter for immunization: Secondary | ICD-10-CM

## 2018-09-27 NOTE — Assessment & Plan Note (Signed)
Counseled on cessation 

## 2018-09-27 NOTE — Assessment & Plan Note (Addendum)
New issue.  May be diet related, stress Will try to reduce carbs, fat. Increase protein. Discussed fiber use as well. No weight loss or concerning signs

## 2018-09-27 NOTE — Assessment & Plan Note (Signed)
Doing well on Biktarvy. Will check again today and rtc 3 months.

## 2018-09-27 NOTE — Progress Notes (Signed)
   Subjective:    Patient ID: Ricke Hey, male    DOB: Aug 23, 1985, 33 y.o.   MRN: [id number]  HPI Here for follow up of HIV Last seen in May and back on his Biktarvy.  Still some tobacco but reducing it.  Having some issues with urgency of bowel movements.  Some abdominal bloating.  BP good today on amlodipine. Taking bactrim.  Last CD4 of 161 and undectable viral load.     Review of Systems  Constitutional: Negative for fatigue, fever and unexpected weight change.  Gastrointestinal: Positive for abdominal distention. Negative for constipation and diarrhea.  Skin: Negative for rash.  Neurological: Negative for dizziness and headaches.       Objective:   Physical Exam Constitutional:      Appearance: Normal appearance.  Cardiovascular:     Rate and Rhythm: Normal rate and regular rhythm.     Heart sounds: No murmur.  Pulmonary:     Effort: Pulmonary effort is normal. No respiratory distress.     Breath sounds: Normal breath sounds.  Skin:    Findings: No rash.  Neurological:     Mental Status: He is alert.  Psychiatric:        Mood and Affect: Mood normal.   SH: + tobacco, trying to quit        Assessment & Plan:

## 2018-09-27 NOTE — Addendum Note (Signed)
Addended by: Aundria Rud on: 09/27/2018 12:07 PM   Modules accepted: Orders

## 2018-09-27 NOTE — Assessment & Plan Note (Signed)
He will continue with Bactrim 

## 2018-09-28 LAB — T-HELPER CELL (CD4) - (RCID CLINIC ONLY)
CD4 % Helper T Cell: 15 % — ABNORMAL LOW (ref 33–65)
CD4 T Cell Abs: 188 /uL — ABNORMAL LOW (ref 400–1790)

## 2018-09-30 LAB — HIV-1 RNA QUANT-NO REFLEX-BLD
HIV 1 RNA Quant: <20 copies/mL
HIV-1 RNA Quant, Log: <1.3 Log copies/mL

## 2019-02-21 ENCOUNTER — Telehealth: Payer: Self-pay

## 2019-02-21 ENCOUNTER — Other Ambulatory Visit: Payer: Self-pay | Admitting: Internal Medicine

## 2019-02-21 DIAGNOSIS — I10 Essential (primary) hypertension: Secondary | ICD-10-CM

## 2019-02-21 NOTE — Telephone Encounter (Signed)
Attempted to call patient to schedule appointment with MD. Patient was supposed to follow up in October, but appointment was never scheduled. Called number listed in chart to offer appointment. Patient did not answer call, but another male did. Did not disclose any information to male who picked up. Requested patient call office back to schedule appointment Aundria Rud, [place]

## 2019-04-12 ENCOUNTER — Telehealth: Payer: Self-pay

## 2019-04-13 ENCOUNTER — Ambulatory Visit: Payer: Self-pay

## 2019-04-13 ENCOUNTER — Other Ambulatory Visit: Payer: Self-pay

## 2019-04-25 ENCOUNTER — Encounter: Payer: Self-pay | Admitting: Internal Medicine

## 2019-06-13 ENCOUNTER — Encounter (HOSPITAL_COMMUNITY): Payer: Self-pay | Admitting: Emergency Medicine

## 2019-06-13 ENCOUNTER — Emergency Department (HOSPITAL_COMMUNITY)
Admission: EM | Admit: 2019-06-13 | Discharge: 2019-06-13 | Disposition: A | Discharge status: Home / Self Care | Payer: Self-pay | Attending: Emergency Medicine | Admitting: Emergency Medicine

## 2019-06-13 ENCOUNTER — Other Ambulatory Visit: Payer: Self-pay

## 2019-06-13 DIAGNOSIS — L0231 Cutaneous abscess of buttock: Secondary | ICD-10-CM | POA: Insufficient documentation

## 2019-06-13 DIAGNOSIS — I10 Essential (primary) hypertension: Secondary | ICD-10-CM | POA: Insufficient documentation

## 2019-06-13 DIAGNOSIS — L0291 Cutaneous abscess, unspecified: Secondary | ICD-10-CM

## 2019-06-13 DIAGNOSIS — Z79899 Other long term (current) drug therapy: Secondary | ICD-10-CM | POA: Insufficient documentation

## 2019-06-13 DIAGNOSIS — J45909 Unspecified asthma, uncomplicated: Secondary | ICD-10-CM | POA: Insufficient documentation

## 2019-06-13 DIAGNOSIS — B20 Human immunodeficiency virus [HIV] disease: Secondary | ICD-10-CM | POA: Insufficient documentation

## 2019-06-13 DIAGNOSIS — F17210 Nicotine dependence, cigarettes, uncomplicated: Secondary | ICD-10-CM | POA: Insufficient documentation

## 2019-06-13 MED ORDER — SULFAMETHOXAZOLE-TRIMETHOPRIM 800-160 MG PO TABS
1.0000 | ORAL_TABLET | Freq: Once | ORAL | Status: AC
  Administered 2019-06-13: 1 via ORAL
  Filled 2019-06-13: qty 1

## 2019-06-13 MED ORDER — MUPIROCIN CALCIUM 2 % EX CREA: 1.0000 "application " | TOPICAL_CREAM | Freq: Two times a day (BID) | CUTANEOUS | 0 refills | Status: DC

## 2019-06-13 MED ORDER — SULFAMETHOXAZOLE-TRIMETHOPRIM 800-160 MG PO TABS: 1.0000 | ORAL_TABLET | Freq: Two times a day (BID) | ORAL | 0 refills | Status: AC

## 2019-06-13 MED ORDER — HYDROCODONE-ACETAMINOPHEN 5-325 MG PO TABS: 1.0000 | ORAL_TABLET | Freq: Four times a day (QID) | ORAL | 0 refills | Status: AC | PRN

## 2019-06-13 MED ORDER — HYDROCODONE-ACETAMINOPHEN 5-325 MG PO TABS
1.0000 | ORAL_TABLET | Freq: Once | ORAL | Status: AC
  Administered 2019-06-13: 1 via ORAL
  Filled 2019-06-13: qty 1

## 2019-06-13 NOTE — Discharge Instructions (Addendum)
Please follow-up with your surgeon.  Continue to do warm water soaks to help drain the abscess.  Return to the emergency department if you have new or or worsening symptoms.

## 2019-06-13 NOTE — ED Triage Notes (Signed)
Patient has draining abscess to left buttocks and new abscess to right buttocks, not currently draining.

## 2019-06-13 NOTE — ED Provider Notes (Signed)
[facility] EMERGENCY DEPARTMENT Provider Note   CSN: [id number] Arrival date & time: 06/13/19  1453     History Chief Complaint  Patient presents with  . Abscess    Phillip Morales is a 34 y.o. male.  Patient is a 34 year old male with past medical history of asthma, HIV, hypertension presenting to the emergency department for buttock abscess.  Reports he has a history of the same.  Reports that he started to have one on his left buttock but that is now draining copious purulent drainage over the last several days.  Reports that he woke up this morning and felt like his right buttock was sore so he wanted to be looked at.  He denies any fever, chills, nausea, vomiting, weight loss.  Reports that he has had to see general surgery for the same in the past.        Past Medical History:  Diagnosis Date  . Asthma   . HIV (human immunodeficiency virus infection) (HCC)   . Hypertension     Patient Active Problem List   Diagnosis Date Noted  . Abdominal bloating 09/27/2018  . At risk for opportunistic infections 08/01/2018  . Screening examination for sexually transmitted disease 11/10/2017  . Vitamin D deficiency 05/27/2014  . 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 Essential hypertension 12/25/2013  . Tobacco dependence 12/25/2013  . History of MRSA infection 02/16/2013  . Essential hypertension, benign 02/16/2013  . History of syphilis 02/16/2013  . Human immunodeficiency virus (HIV) disease (HCC) 10/24/2012  . Anemia 10/23/2012  . Gluteal abscess 10/22/2012    History reviewed. No pertinent surgical history.     Family History  Problem Relation Age of Onset  . Kidney disease Neg Hx     Social History   Tobacco Use  . Smoking status: Light Tobacco Smoker    Packs/day: 1.00    Types: Cigarettes    Start date: 03/03/2013  . Smokeless tobacco: Never Used  . Tobacco comment: 1 pack every 3 days  Substance Use Topics  . Alcohol use: Not Currently    Alcohol/week: 0.0  standard drinks    Comment: social  . Drug use: Not Currently    Types: Marijuana    Comment: 2 days ago    Home Medications Prior to Admission medications   Medication Sig Start Date End Date Taking? Authorizing Provider  amLODipine (NORVASC) 5 MG tablet TAKE 1 TABLET(5 MG) BY MOUTH DAILY 02/21/19   Comer, Belia Heman, MD  bictegravir-emtricitabine-tenofovir AF (BIKTARVY) 50-200-25 MG TABS tablet Take 1 tablet by mouth daily. 08/01/18   Comer, Belia Heman, MD  HYDROcodone-acetaminophen (NORCO/VICODIN) 5-325 MG tablet Take 1 tablet by mouth every 6 (six) hours as needed for up to 2 days for severe pain. 06/13/19 06/15/19  Arlyn Dunning, PA-C  mupirocin cream (BACTROBAN) 2 % Apply 1 application topically 2 (two) times daily. 06/13/19   Arlyn Dunning, PA-C  sulfamethoxazole-trimethoprim (BACTRIM DS) 800-160 MG tablet Take 1 tablet by mouth 2 (two) times daily for 7 days. 06/13/19 06/20/19  Arlyn Dunning, PA-C    Allergies    Peanut-containing drug products  Review of Systems   Review of Systems  Constitutional: Negative for appetite change, chills and fever.  Gastrointestinal: Negative for abdominal pain, nausea and vomiting.  Musculoskeletal: Negative for arthralgias.  Skin: Positive for color change and wound.  Allergic/Immunologic: Positive for immunocompromised state.  Hematological: Does not bruise/bleed easily.    Physical Exam Updated Vital Signs BP (!) 156/82 (BP Location:  Left Arm)   Pulse 95   Temp (!) 97.2 F (36.2 C) (Oral)   Resp 20   Ht 5\' 8"  (1.727 m)   Wt 91.6 kg   SpO2 99%   BMI 30.71 kg/m   Physical Exam Vitals and nursing note reviewed.  Constitutional:      Appearance: Normal appearance.  HENT:     Head: Normocephalic.  Eyes:     Conjunctiva/sclera: Conjunctivae normal.  Pulmonary:     Effort: Pulmonary effort is normal.  Genitourinary:      Comments: No surrounding erythema to either abscess. Skin:    General: Skin is dry.  Neurological:      Mental Status: He is alert.  Psychiatric:        Mood and Affect: Mood normal.     ED Results / Procedures / Treatments   Labs (all labs ordered are listed, but only abnormal results are displayed) Labs Reviewed - No data to display  EKG None  Radiology No results found.  Procedures Procedures (including critical care time)  Medications Ordered in ED Medications  sulfamethoxazole-trimethoprim (BACTRIM DS) 800-160 MG per tablet 1 tablet (has no administration in time range)  HYDROcodone-acetaminophen (NORCO/VICODIN) 5-325 MG per tablet 1 tablet (has no administration in time range)    ED Course  I have reviewed the triage vital signs and the nursing notes.  Pertinent labs & imaging results that were available during my care of the patient were reviewed by me and considered in my medical decision making (see chart for details).  Clinical Course as of Jun 13 1914  Wed Jun 13, 2019  1915 Patient with abscess to buttock.  It is currently open and draining.  He has another sore area on the right buttock which does not appear to be an abscess but may be a developing 1.  Discussed incision and drainage versus antibiotic and watch and wait.  Patient declines any incision and drainage.  He will be started on Bactrim and advised to follow-up with his general surgeon.   [KM]    Clinical Course User Index [KM] Jun 15, 2019   MDM Rules/Calculators/A&P                      Based on review of vitals, medical screening exam, lab work and/or imaging, there does not appear to be an acute, emergent etiology for the patient's symptoms. Counseled pt on good return precautions and encouraged both PCP and ED follow-up as needed.  Prior to discharge, I also discussed incidental imaging findings with patient in detail and advised appropriate, recommended follow-up in detail.  Clinical Impression: 1. Abscess     Disposition: Discharge  Prior to providing a prescription for a  controlled substance, I independently reviewed the patient's recent prescription history on the [place] Controlled Substance Reporting System. The patient had no recent or regular prescriptions and was deemed appropriate for a brief, less than 3 day prescription of narcotic for acute analgesia.  This note was prepared with assistance of [place]. Occasional wrong-word or sound-a-like substitutions may have occurred due to the inherent limitations of voice recognition software.  Final Clinical Impression(s) / ED Diagnoses Final diagnoses:  Abscess    Rx / DC Orders ED Discharge Orders         Ordered    HYDROcodone-acetaminophen (NORCO/VICODIN) 5-325 MG tablet  Every 6 hours PRN     06/13/19 1912    sulfamethoxazole-trimethoprim (BACTRIM DS) 800-160 MG  tablet  2 times daily     06/13/19 1912    mupirocin cream (BACTROBAN) 2 %  2 times daily     06/13/19 1912           Kristine Royal 06/13/19 1916    Milton Ferguson, MD 06/13/19 2320

## 2019-06-27 ENCOUNTER — Ambulatory Visit: Payer: Self-pay

## 2019-06-27 ENCOUNTER — Other Ambulatory Visit: Payer: Self-pay

## 2019-07-18 ENCOUNTER — Encounter: Payer: Self-pay | Admitting: Internal Medicine

## 2019-08-22 ENCOUNTER — Other Ambulatory Visit: Payer: Self-pay

## 2019-08-22 ENCOUNTER — Ambulatory Visit: Payer: Self-pay

## 2019-08-22 ENCOUNTER — Telehealth: Payer: Self-pay | Admitting: Pharmacy Technician

## 2019-08-22 DIAGNOSIS — B20 Human immunodeficiency virus [HIV] disease: Secondary | ICD-10-CM

## 2019-08-22 DIAGNOSIS — Z113 Encounter for screening for infections with a predominantly sexual mode of transmission: Secondary | ICD-10-CM

## 2019-08-22 IMAGING — CT CT PELVIS W/ CM
2 of 4 series · 16 of 46 positions shown, 18 images · IV contrast (Isovue)
Comparison: Abdominopelvic CT 06/24/2012

CLINICAL DATA: Right buttock abscess.

EXAM:
CT PELVIS WITH CONTRAST
TECHNIQUE: Multidetector CT imaging of the pelvis was performed using the
standard protocol following the bolus administration of intravenous
contrast.
CONTRAST:  100mL 2GGY3A-K33 IOPAMIDOL (2GGY3A-K33) INJECTION 61%

[Series 2: axial st · axial · 0.87mm/px · z∈[+922,+1187]mm · 13 of 63 slices shown, 15 images]
[im 5/63  soft-tissue]
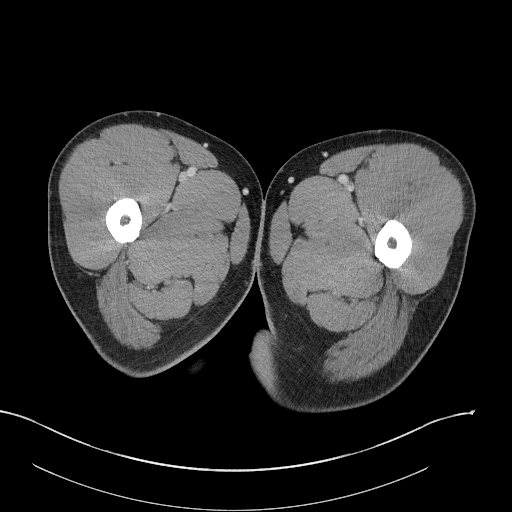
[im 5/63  bone]
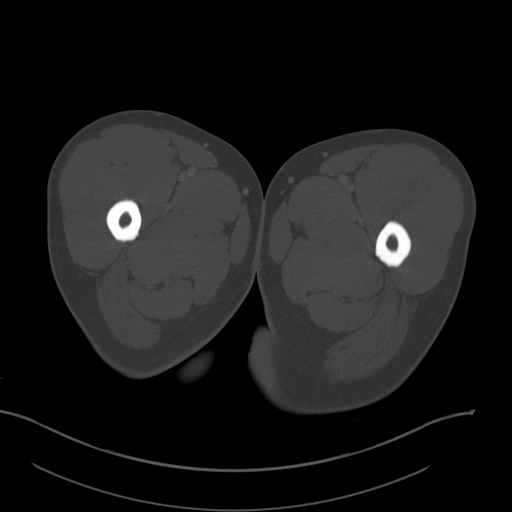
[im 9/63  soft-tissue]
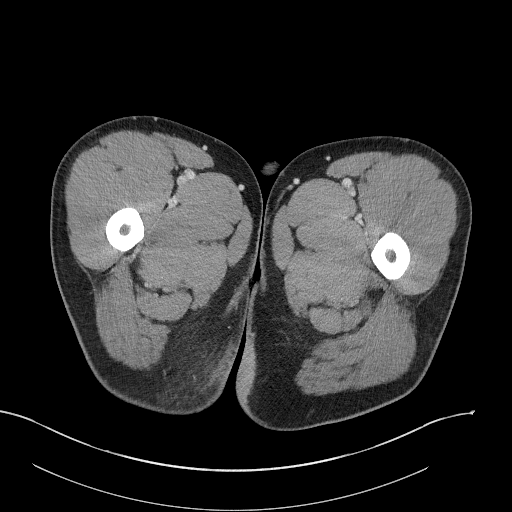
[im 13/63  soft-tissue]
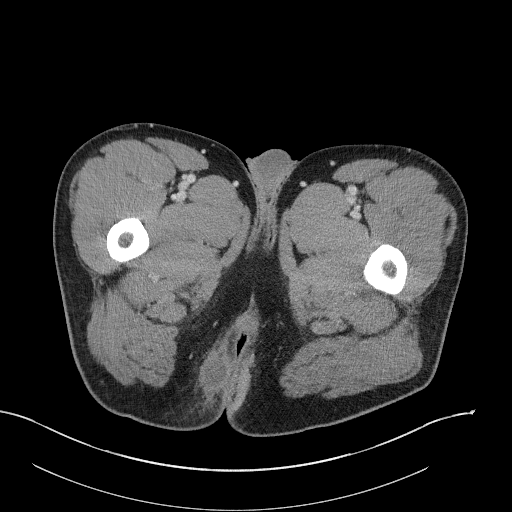
[im 17/63  soft-tissue]
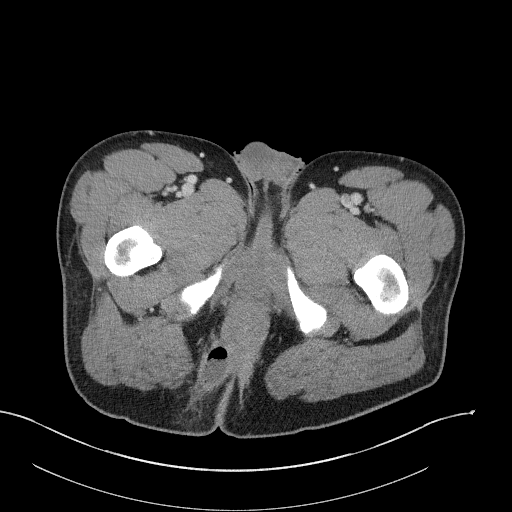
[im 21/63  soft-tissue]
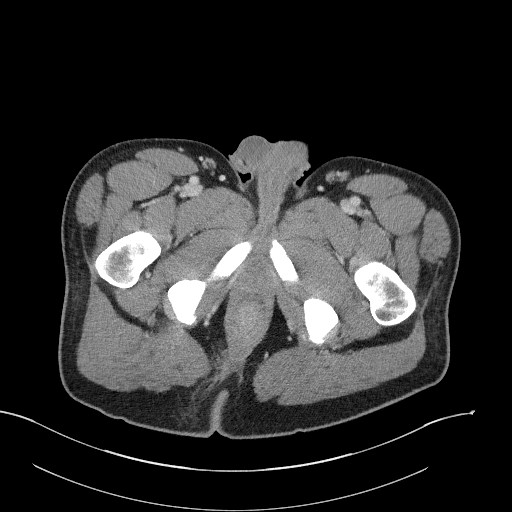
[im 25/63  soft-tissue]
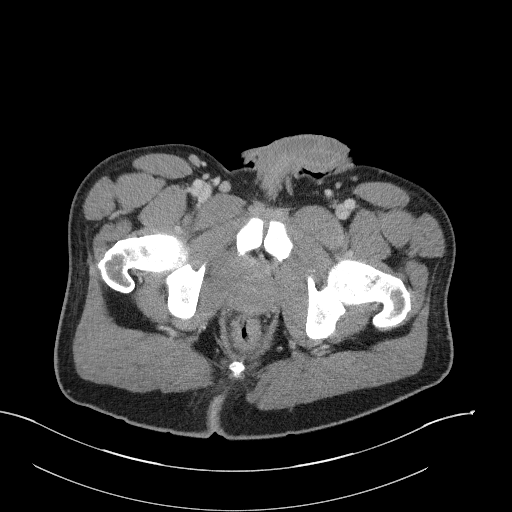
[im 34/63  soft-tissue]
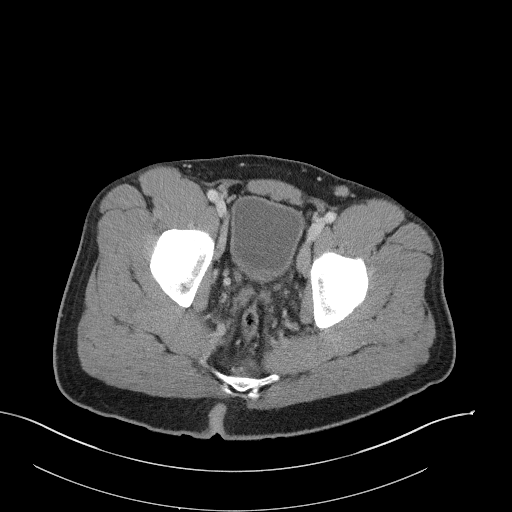
[im 38/63  soft-tissue]
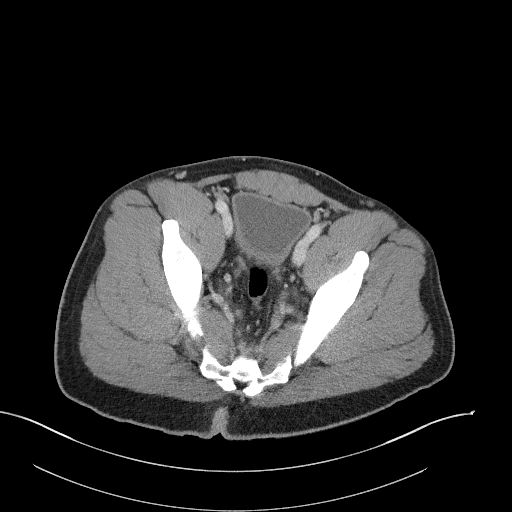
[im 42/63  soft-tissue]
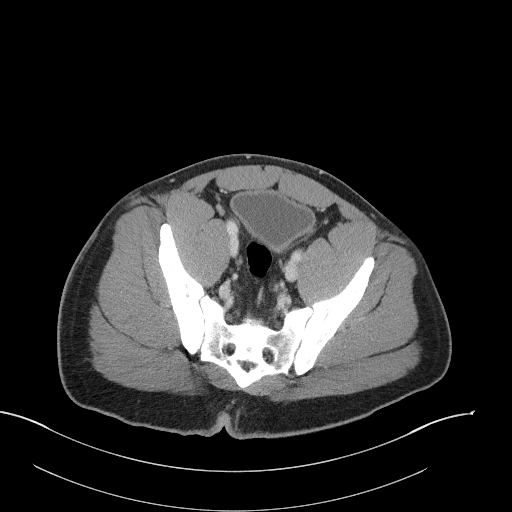
[im 42/63  bone]
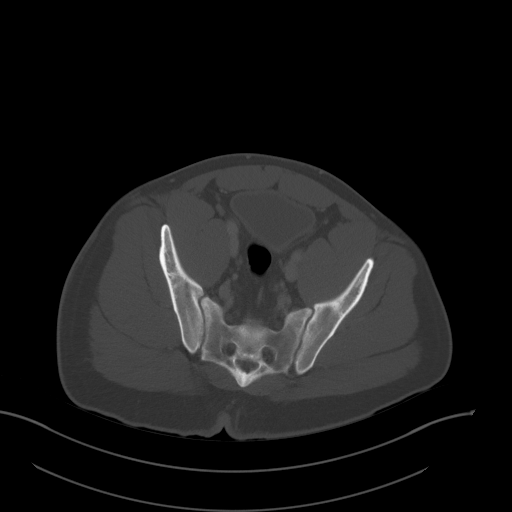
[im 46/63  soft-tissue]
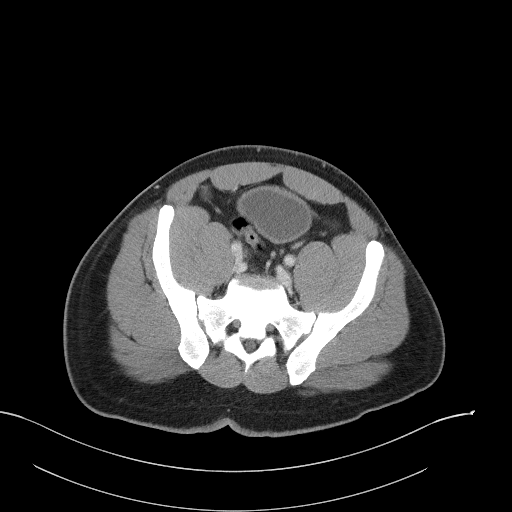
[im 50/63  soft-tissue]
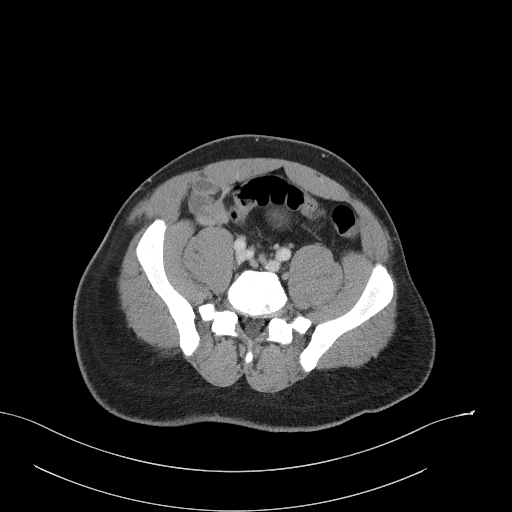
[im 54/63  soft-tissue]
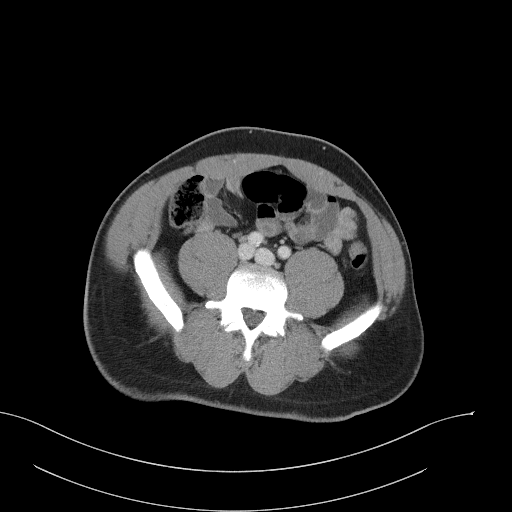
[im 58/63  soft-tissue]
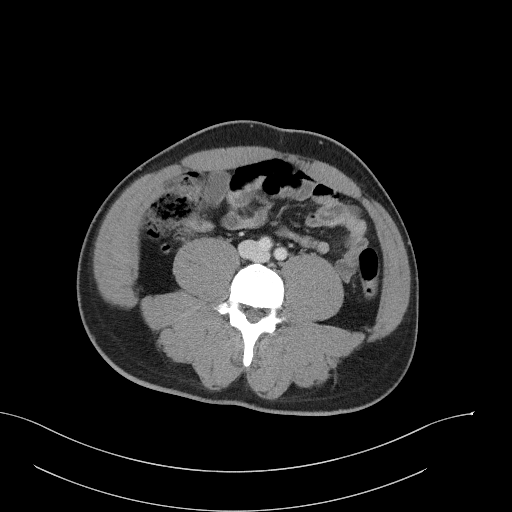

[Series 4: coronal st · coronal · 0.63mm/px · 3 of 107 slices shown]
[im 36/107  soft-tissue]
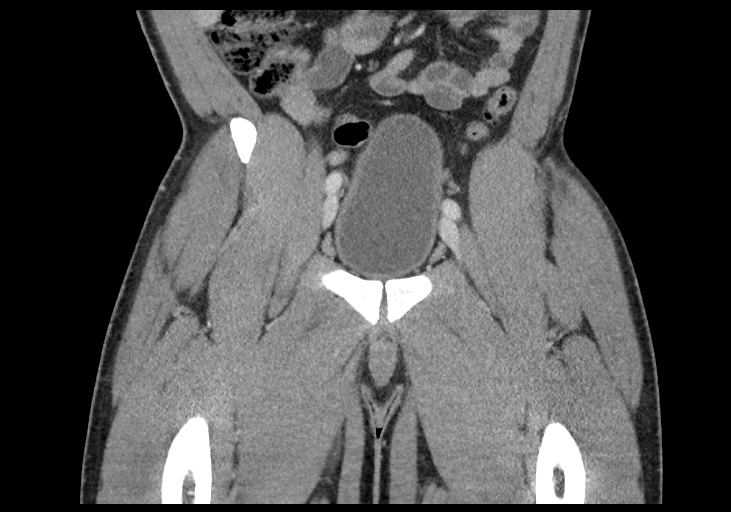
[im 48/107  soft-tissue]
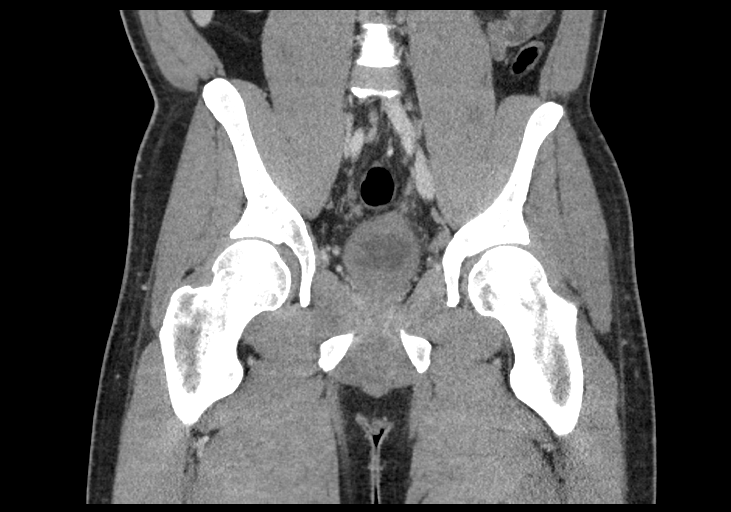
[im 59/107  soft-tissue]
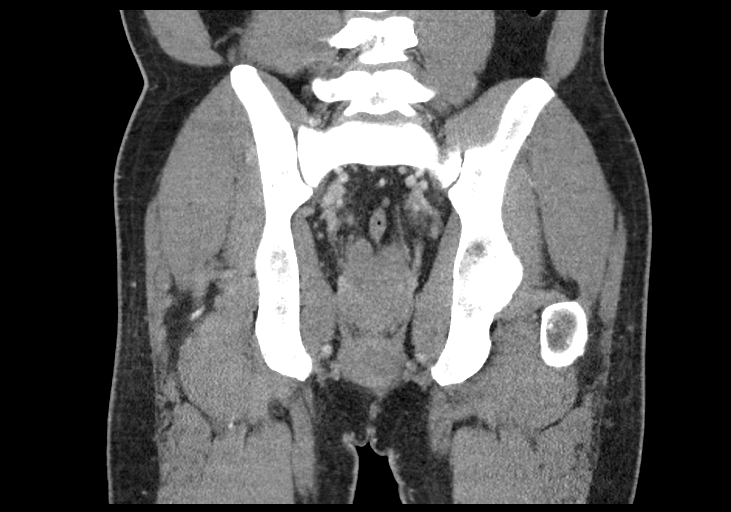

[16 of 46 positions shown; findings below may reference images not displayed]

FINDINGS: Urinary Tract: Distal ureters are nondistended. Mild bladder wall
thickening inferiorly. No intravesicular air.

Bowel: Diffuse rectal wall thickening. Right perirectal fluid
collection with peripheral enhancement and internal air measures
x 2.6 x 4.3 cm. Inflammatory changes track along the right gluteal
crease. Mild soft tissue induration tracks from the left lateral
rectum to the gluteal fold with some surrounding stranding and small
internal foci of air. This measures approximately 4.4 x 1.5 x
cm. Remaining pelvic bowel loops are normal. Normal appendix.

Vascular/Lymphatic: Prominent left iliac node measures 15 mm.
Prominent left external iliac node measures 13 mm. Additional
smaller bilateral external iliac and inguinal nodes. No acute
vascular findings.

Reproductive:  Prostate gland is unremarkable.

Other:  No free air in the pelvis.

Musculoskeletal: There are no acute or suspicious osseous
abnormalities.
IMPRESSION: 1. Right perirectal abscess measures 4.4 x 2.6 x 4.3 cm.
2. Soft tissue induration tracking from the left lateral rectum with
small focus of internal air, suspicious for left perirectal abscess
measuring 4.4 x 1.5 x 3.1 cm.
3. Mild bladder wall thickening at the bladder base of unknown
significance.

## 2019-08-22 MED ORDER — BICTEGRAVIR-EMTRICITAB-TENOFOV 50-200-25 MG PO TABS: 1.0000 | ORAL_TABLET | Freq: Every day | ORAL | 0 refills | Status: DC

## 2019-08-22 NOTE — Telephone Encounter (Addendum)
[facility] Patient Advocate Encounter   Patient has been approved for [employer] Advancing Access Patient Assistance Program for [place]  for up to 67-months coverage beginning 08/22/2019. This assistance will make the patient's copay $0.  The billing information is as follows and has been shared with [employer].    Patient knows to call the office with questions or concerns.  Beulah Gandy, CPhT Specialty Pharmacy Patient [facility] Phone: [phone] Fax: [phone] 08/22/2019 9:12 AM

## 2019-08-23 LAB — T-HELPER CELL (CD4) - (RCID CLINIC ONLY)
CD4 % Helper T Cell: 19 % — ABNORMAL LOW (ref 33–65)
CD4 T Cell Abs: 212 /uL — ABNORMAL LOW (ref 400–1790)

## 2019-08-24 LAB — FLUORESCENT TREPONEMAL AB(FTA)-IGG-BLD: Fluorescent Treponemal ABS: REACTIVE — AB

## 2019-08-24 LAB — HIV-1 RNA QUANT-NO REFLEX-BLD
HIV 1 RNA Quant: 32 copies/mL — ABNORMAL HIGH
HIV-1 RNA Quant, Log: 1.51 Log copies/mL — ABNORMAL HIGH

## 2019-08-24 LAB — RPR: RPR Ser Ql: REACTIVE — AB

## 2019-08-24 LAB — RPR TITER: RPR Titer: 1:4 {titer} — ABNORMAL HIGH

## 2019-09-05 ENCOUNTER — Telehealth: Payer: Self-pay

## 2019-09-05 NOTE — Telephone Encounter (Signed)
COVID-19 Pre-Screening Questions:09/05/19    Do you currently have a fever (>100 F), chills or unexplained body aches?NO   Are you currently experiencing new cough, shortness of breath, sore throat, runny nose? NO  .  Have you recently travelled outside the state of [place] in the last 14 days? NO  .  Have you been in contact with someone that is currently pending confirmation of Covid19 testing or has been confirmed to have the Covid19 virus?  NO

## 2019-09-06 ENCOUNTER — Encounter: Payer: Self-pay | Admitting: Family

## 2019-09-16 ENCOUNTER — Other Ambulatory Visit: Payer: Self-pay | Admitting: Family

## 2019-10-16 ENCOUNTER — Encounter: Payer: Self-pay | Admitting: Internal Medicine

## 2019-10-17 ENCOUNTER — Ambulatory Visit: Payer: Self-pay

## 2019-10-17 ENCOUNTER — Other Ambulatory Visit: Payer: Self-pay

## 2019-11-02 ENCOUNTER — Telehealth: Payer: Self-pay

## 2019-11-02 NOTE — Telephone Encounter (Signed)
COVID-19 Pre-Screening Questions:11/02/19  Do you currently have a fever (>100 F), chills or unexplained body aches? NO  Are you currently experiencing new cough, shortness of breath, sore throat, runny nose?NO .  Have you recently travelled outside the state of [place] in the last 14 days?NO .  Have you been in contact with someone that is currently pending confirmation of Covid19 testing or has been confirmed to have the Covid19 virus? NO  *Has also been advised to call back to reschedule if any Covid related symptoms would appear over the weekend.  **If the patient answers NO to ALL questions -  advise the patient to please call the clinic before coming to the office should any symptoms develop.

## 2019-11-05 ENCOUNTER — Ambulatory Visit (INDEPENDENT_AMBULATORY_CARE_PROVIDER_SITE_OTHER): Payer: Self-pay | Admitting: Internal Medicine

## 2019-11-05 ENCOUNTER — Encounter: Payer: Self-pay | Admitting: Internal Medicine

## 2019-11-05 ENCOUNTER — Other Ambulatory Visit: Payer: Self-pay

## 2019-11-05 VITALS — BP 160/102 | HR 78 | Temp 98.4°F | Wt 197.0 lb

## 2019-11-05 DIAGNOSIS — F17200 Nicotine dependence, unspecified, uncomplicated: Secondary | ICD-10-CM

## 2019-11-05 DIAGNOSIS — I10 Essential (primary) hypertension: Secondary | ICD-10-CM

## 2019-11-05 DIAGNOSIS — B20 Human immunodeficiency virus [HIV] disease: Secondary | ICD-10-CM

## 2019-11-05 MED ORDER — BICTEGRAVIR-EMTRICITAB-TENOFOV 50-200-25 MG PO TABS: 1.0000 | ORAL_TABLET | Freq: Every day | ORAL | 11 refills | Status: DC

## 2019-11-05 NOTE — Assessment & Plan Note (Signed)
Again in poor control.  I have refilled the medication and he will pick it up and start today.  I will schedule him for labs in about 4 weeks then follow up with me.

## 2019-11-05 NOTE — Assessment & Plan Note (Signed)
Discussed cessation 

## 2019-11-05 NOTE — Assessment & Plan Note (Addendum)
Poorly controlled.  bp elevated since he has not been on his BP medication.  He is restarting that today.  He will follow up with his PCP

## 2019-11-05 NOTE — Progress Notes (Signed)
   Subjective:    Patient ID: [name], male    DOB: 11-30-1985, 34 y.o.   MRN: [id number]  HPI He is here for follow up of HIV He had done well but off his medications the last 2-3 weeks.  Last CD4 up to 212 and viral load just 32,  But off now.  He Has no new issues otherwise.  No associated weight loss or diarrhea. He has not yet had his COVID vaccine and not sure if he will get it.    Review of Systems  Constitutional: Negative for unexpected weight change.  Gastrointestinal: Negative for diarrhea and nausea.  Skin: Negative for rash.       Objective:   Physical Exam Constitutional:      Appearance: Normal appearance.  Eyes:     General: No scleral icterus. Pulmonary:     Effort: Pulmonary effort is normal.  Neurological:     General: No focal deficit present.     Mental Status: He is alert.  Psychiatric:        Mood and Affect: Mood normal.           Assessment & Plan:

## 2019-11-19 ENCOUNTER — Other Ambulatory Visit: Payer: Self-pay | Admitting: Internal Medicine

## 2019-11-19 DIAGNOSIS — I10 Essential (primary) hypertension: Secondary | ICD-10-CM

## 2019-11-26 ENCOUNTER — Encounter: Payer: Self-pay | Admitting: Internal Medicine

## 2019-12-24 ENCOUNTER — Ambulatory Visit: Payer: Self-pay | Admitting: Internal Medicine

## 2020-06-05 ENCOUNTER — Other Ambulatory Visit: Payer: Self-pay | Admitting: Internal Medicine

## 2020-06-05 DIAGNOSIS — I10 Essential (primary) hypertension: Secondary | ICD-10-CM

## 2020-07-02 ENCOUNTER — Other Ambulatory Visit: Payer: Self-pay | Admitting: Internal Medicine

## 2020-07-02 ENCOUNTER — Ambulatory Visit: Payer: Self-pay

## 2020-07-02 DIAGNOSIS — I10 Essential (primary) hypertension: Secondary | ICD-10-CM

## 2020-07-11 ENCOUNTER — Ambulatory Visit: Payer: Self-pay | Admitting: Internal Medicine

## 2020-07-15 ENCOUNTER — Ambulatory Visit: Payer: Self-pay | Admitting: Internal Medicine

## 2020-07-17 ENCOUNTER — Other Ambulatory Visit: Payer: Self-pay

## 2020-07-29 ENCOUNTER — Ambulatory Visit: Payer: Self-pay | Admitting: Internal Medicine

## 2020-08-01 ENCOUNTER — Ambulatory Visit: Payer: Self-pay | Admitting: Internal Medicine

## 2020-10-13 ENCOUNTER — Ambulatory Visit: Payer: Self-pay

## 2020-10-14 ENCOUNTER — Ambulatory Visit: Payer: Self-pay

## 2020-10-14 ENCOUNTER — Other Ambulatory Visit: Payer: Self-pay

## 2020-10-14 DIAGNOSIS — B20 Human immunodeficiency virus [HIV] disease: Secondary | ICD-10-CM

## 2020-10-15 LAB — T-HELPER CELL (CD4) - (RCID CLINIC ONLY)
CD4 % Helper T Cell: 25 % — ABNORMAL LOW (ref 33–65)
CD4 T Cell Abs: 285 /uL — ABNORMAL LOW (ref 400–1790)

## 2020-10-16 LAB — COMPLETE METABOLIC PANEL WITH GFR
AG Ratio: 1.1 (calc) (ref 1.0–2.5)
ALT: 9 U/L (ref 9–46)
AST: 15 U/L (ref 10–40)
Albumin: 4.1 g/dL (ref 3.6–5.1)
Alkaline phosphatase (APISO): 54 U/L (ref 36–130)
BUN: 10 mg/dL (ref 7–25)
CO2: 28 mmol/L (ref 20–32)
Calcium: 9.1 mg/dL (ref 8.6–10.3)
Chloride: 107 mmol/L (ref 98–110)
Creat: 1.23 mg/dL (ref 0.60–1.26)
Globulin: 3.7 g/dL (calc) (ref 1.9–3.7)
Glucose, Bld: 82 mg/dL (ref 65–99)
Potassium: 4.2 mmol/L (ref 3.5–5.3)
Sodium: 139 mmol/L (ref 135–146)
Total Bilirubin: 0.3 mg/dL (ref 0.2–1.2)
Total Protein: 7.8 g/dL (ref 6.1–8.1)
eGFR: 79 mL/min/{1.73_m2} (ref >=60)

## 2020-10-16 LAB — HIV-1 RNA QUANT-NO REFLEX-BLD
HIV 1 RNA Quant: NOT DETECTED Copies/mL
HIV-1 RNA Quant, Log: NOT DETECTED Log cps/mL

## 2020-10-28 ENCOUNTER — Other Ambulatory Visit (HOSPITAL_COMMUNITY): Payer: Self-pay

## 2020-10-28 ENCOUNTER — Ambulatory Visit (INDEPENDENT_AMBULATORY_CARE_PROVIDER_SITE_OTHER): Payer: Self-pay | Admitting: Internal Medicine

## 2020-10-28 ENCOUNTER — Encounter: Payer: Self-pay | Admitting: Internal Medicine

## 2020-10-28 ENCOUNTER — Other Ambulatory Visit: Payer: Self-pay

## 2020-10-28 VITALS — BP 166/115 | HR 85 | Temp 98.7°F | Ht 67.0 in | Wt 201.0 lb

## 2020-10-28 DIAGNOSIS — B20 Human immunodeficiency virus [HIV] disease: Secondary | ICD-10-CM

## 2020-10-28 DIAGNOSIS — Z113 Encounter for screening for infections with a predominantly sexual mode of transmission: Secondary | ICD-10-CM

## 2020-10-28 DIAGNOSIS — I10 Essential (primary) hypertension: Secondary | ICD-10-CM

## 2020-10-28 MED ORDER — BICTEGRAVIR-EMTRICITAB-TENOFOV 50-200-25 MG PO TABS: 1.0000 | ORAL_TABLET | Freq: Every day | ORAL | 11 refills | Status: DC

## 2020-10-28 MED ORDER — AMLODIPINE BESYLATE 5 MG PO TABS: ORAL_TABLET | ORAL | 0 refills | Status: DC

## 2020-10-28 NOTE — Progress Notes (Signed)
   Subjective:    Patient ID: Phillip Morales, male    DOB: [birth year], 35 y.o.   MRN: [id number]  HPI Here for follow up of HIV He has very sporadic follow up and last seen about 1 year ago and had been off of medications.  CD4 now though is 285 and viral load not detected on Biktarvy but unfortunately had not renewed HMAP and out again for the last 1.5 weeks.  No new issues though and no concerns.  No issues taking or tolerating when he is on it.  Has been out of his amlodipine as well.    Review of Systems  Constitutional:  Negative for fatigue.  Gastrointestinal:  Negative for diarrhea and nausea.  Skin:  Negative for rash.      Objective:   Physical Exam Eyes:     General: No scleral icterus. Cardiovascular:     Rate and Rhythm: Normal rate.  Pulmonary:     Effort: Pulmonary effort is normal.  Neurological:     General: No focal deficit present.     Mental Status: He is alert.  Psychiatric:        Mood and Affect: Mood normal.    SH: no current tobacco use      Assessment & Plan:

## 2020-10-28 NOTE — Assessment & Plan Note (Signed)
Will screen 

## 2020-10-28 NOTE — Assessment & Plan Note (Signed)
I will refill and I have referred him to a PCP for continued care and refills, if appropriate.

## 2020-10-28 NOTE — Assessment & Plan Note (Addendum)
Uncontrolled again but did complete the paperwork today.  Now has better transportation.  Samples given to bridge him and he will return in about 5 weeks and I will recheck his viral load and genotype to be sure no issues.

## 2020-11-04 ENCOUNTER — Other Ambulatory Visit: Payer: Self-pay | Admitting: Pharmacist

## 2020-11-04 ENCOUNTER — Encounter: Payer: Self-pay | Admitting: Internal Medicine

## 2020-11-04 MED ORDER — BIKTARVY 50-200-25 MG PO TABS: 1.0000 | ORAL_TABLET | Freq: Every day | ORAL | 0 refills | Status: DC

## 2020-11-04 NOTE — Progress Notes (Signed)
Medication Samples have been provided to the patient.  Drug name: Biktarvy        Strength: 50/200/25 mg       Qty: 28 (4 bottles)   LOT: [id number]   Exp.Date: 08/14/2022  Dosing instructions: Take one tablet by mouth once daily  The patient has been instructed regarding the correct time, dose, and frequency of taking this medication, including desired effects and most common side effects.   Floyce Bujak L. Jannette Fogo, PharmD, BCIDP, AAHIVP, CPP Clinical Pharmacist Practitioner Infectious Diseases Clinical Pharmacist [facility] 02/25/2020, 10:07 AM

## 2020-12-01 ENCOUNTER — Ambulatory Visit: Payer: Self-pay | Admitting: Internal Medicine

## 2020-12-16 ENCOUNTER — Emergency Department (HOSPITAL_COMMUNITY)
Admission: EM | Admit: 2020-12-16 | Discharge: 2020-12-16 | Disposition: A | Discharge status: Home / Self Care | Payer: Self-pay | Attending: Emergency Medicine | Admitting: Emergency Medicine

## 2020-12-16 ENCOUNTER — Other Ambulatory Visit: Payer: Self-pay

## 2020-12-16 ENCOUNTER — Encounter (HOSPITAL_COMMUNITY): Payer: Self-pay | Admitting: Emergency Medicine

## 2020-12-16 DIAGNOSIS — Z79899 Other long term (current) drug therapy: Secondary | ICD-10-CM | POA: Insufficient documentation

## 2020-12-16 DIAGNOSIS — J45909 Unspecified asthma, uncomplicated: Secondary | ICD-10-CM | POA: Insufficient documentation

## 2020-12-16 DIAGNOSIS — I10 Essential (primary) hypertension: Secondary | ICD-10-CM | POA: Insufficient documentation

## 2020-12-16 DIAGNOSIS — M546 Pain in thoracic spine: Secondary | ICD-10-CM | POA: Insufficient documentation

## 2020-12-16 DIAGNOSIS — Z21 Asymptomatic human immunodeficiency virus [HIV] infection status: Secondary | ICD-10-CM | POA: Insufficient documentation

## 2020-12-16 DIAGNOSIS — Z87891 Personal history of nicotine dependence: Secondary | ICD-10-CM | POA: Insufficient documentation

## 2020-12-16 DIAGNOSIS — Z91010 Allergy to peanuts: Secondary | ICD-10-CM | POA: Insufficient documentation

## 2020-12-16 LAB — URINALYSIS, ROUTINE W REFLEX MICROSCOPIC
Bilirubin Urine: NEGATIVE
Glucose, UA: NEGATIVE mg/dL
Ketones, ur: NEGATIVE mg/dL
Leukocytes,Ua: NEGATIVE
Nitrite: NEGATIVE
Protein, ur: NEGATIVE mg/dL
Specific Gravity, Urine: 1.013 (ref 1.005–1.030)
pH: 6 (ref 5.0–8.0)

## 2020-12-16 MED ORDER — LIDOCAINE 5 % EX PTCH
1.0000 | MEDICATED_PATCH | CUTANEOUS | Status: DC
  Administered 2020-12-16: 1 via TRANSDERMAL
  Filled 2020-12-16: qty 1

## 2020-12-16 MED ORDER — LIDOCAINE 5 % EX PTCH
1.0000 | MEDICATED_PATCH | CUTANEOUS | 0 refills | Status: DC
Start: 2020-12-16 — End: 2022-01-27

## 2020-12-16 NOTE — Discharge Instructions (Signed)
You were seen in the emergency department today for your back pain.  Your physical exam and vital signs are very reassuring, as was your urine test.  The muscles in your mid back are in what is called spasm, meaning they are inappropriately tightened up.  This can be quite painful.  To help with your pain you may take Tylenol and / or NSAID medication (such as ibuprofen or naproxen) to help with your pain.  You may also utilize topical pain relief such as Biofreeze, IcyHot, or topical lidocaine patches.  I also recommend that you apply heat to the area, such as a hot shower or heating pad, and follow heat application with massage of the muscles that are most tight.  Please return to the emergency department if you develop any numbness/tingling/weakness in your arms or legs, any difficulty urinating, or urinary incontinence chest pain, shortness of breath, abdominal pain, nausea or vomiting that does not stop, or any other new severe symptoms.

## 2020-12-16 NOTE — ED Triage Notes (Signed)
Left mid back pain x 2 days ago. Denies injury, no gi/gu sx.

## 2020-12-16 NOTE — ED Provider Notes (Signed)
[facility] EMERGENCY DEPARTMENT Provider Note   CSN: [id number] Arrival date & time: 12/16/20  1305     History Chief Complaint  Patient presents with   Back Pain    Phillip Morales is a 35 y.o. male who presents with left mid back pain x2 days.  He denies any known injury, urinary symptoms, or SOB. 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 His back pain is worse with movement, he has not tried any OTC medications for his pain. Was directed to the ED for assessment by his employer.    I have personally reviewed this patient's medical records.  He has history of HIV, hypertension, syphilis, and tobacco dependence.  He is on daily antiviral medications as well as Norvasc.  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 No pertinent surgical history.     Family History  Problem Relation Age of Onset   Kidney disease Neg Hx     Social History   Tobacco Use   Smoking status: Former    Packs/day: 1.00    Types: Cigarettes    Start date: 03/03/2013   Smokeless tobacco: Never   Tobacco comments:    1 pack every 3 days  Vaping Use   Vaping Use: Former  Substance Use Topics   Alcohol use: Not Currently    Alcohol/week: 0.0 standard drinks    Comment: social   Drug use: Not Currently    Types: Marijuana    Comment: 2 days ago    Home Medications Prior to Admission medications    Medication Sig Start Date End Date Taking? Authorizing Provider  lidocaine (LIDODERM) 5 % Place 1 patch onto the skin daily. Remove & Discard patch within 12 hours or as directed by MD 12/16/20  Yes Uva Runkel, Lupe Carney R, PA-C  amLODipine (NORVASC) 5 MG tablet TAKE 1 TABLET(5 MG) BY MOUTH DAILY 10/28/20   Comer, Belia Heman, MD  bictegravir-emtricitabine-tenofovir AF (BIKTARVY) 50-200-25 MG TABS tablet Take 1 tablet by mouth daily. 10/28/20   Gardiner Barefoot, MD  bictegravir-emtricitabine-tenofovir AF (BIKTARVY) 50-200-25 MG TABS tablet Take 1 tablet by mouth daily. 11/04/20   Kuppelweiser, Cassie L, RPH-CPP  mupirocin cream (BACTROBAN) 2 % Apply 1 application topically 2 (two) times daily. Patient not taking: Reported on 10/28/2020 06/13/19   Arlyn Dunning, PA-C    Allergies    Peanut-containing drug products  Review of Systems   Review of Systems  Constitutional: Negative.   HENT: Negative.    Eyes: Negative.   Respiratory: Negative.    Cardiovascular: Negative.   Gastrointestinal: Negative.   Genitourinary: Negative.   Musculoskeletal:  Positive for back pain. Negative for neck pain and neck stiffness.  Skin: Negative.   Neurological: Negative.    Physical Exam Updated Vital Signs BP 136/88   Pulse 60   Temp 98 F (36.7 C) (Oral)   Resp 16  Ht 5\' 7"  (1.702 m)   Wt 90.7 kg   SpO2 98%   BMI 31.32 kg/m   Physical Exam Vitals and nursing note reviewed.  Constitutional:      General: He is not in acute distress.    Appearance: He is not ill-appearing or toxic-appearing.  HENT:     Head: Normocephalic and atraumatic.     Mouth/Throat:     Mouth: Mucous membranes are moist.     Pharynx: No oropharyngeal exudate or posterior oropharyngeal erythema.  Eyes:     General:        Right eye: No discharge.        Left eye: No discharge.     Extraocular Movements: Extraocular movements intact.     Conjunctiva/sclera: Conjunctivae normal.     Pupils: Pupils are equal, round, and  reactive to light.  Cardiovascular:     Rate and Rhythm: Normal rate and regular rhythm.     Pulses: Normal pulses.     Heart sounds: Normal heart sounds. No murmur heard. Pulmonary:     Effort: Pulmonary effort is normal. No respiratory distress.     Breath sounds: Normal breath sounds. No wheezing or rales.  Abdominal:     General: Bowel sounds are normal. There is no distension.     Palpations: Abdomen is soft.     Tenderness: There is no abdominal tenderness. There is no right CVA tenderness, left CVA tenderness, guarding or rebound.  Musculoskeletal:        General: Tenderness present. No deformity.     Cervical back: Normal and neck supple. No bony tenderness.     Thoracic back: Spasms and tenderness present. No bony tenderness.     Lumbar back: Normal. No spasms, tenderness or bony tenderness.       Back:     Right lower leg: No edema.     Left lower leg: No edema.     Comments: Symmetric grip strength bilaterally.  Full range of motion of the shoulders bilaterally.  Skin:    General: Skin is warm and dry.     Capillary Refill: Capillary refill takes less than 2 seconds.  Neurological:     General: No focal deficit present.     Mental Status: He is alert and oriented to person, place, and time. Mental status is at baseline.     Sensory: Sensation is intact.     Motor: Motor function is intact.     Gait: Gait is intact.  Psychiatric:        Mood and Affect: Mood normal.    ED Results / Procedures / Treatments   Labs (all labs ordered are listed, but only abnormal results are displayed) Labs Reviewed  URINALYSIS, ROUTINE W REFLEX MICROSCOPIC - Abnormal; Notable for the following components:      Result Value   Hgb urine dipstick MODERATE (*)    Bacteria, UA RARE (*)    All other components within normal limits    EKG None  Radiology No results found.  Procedures Procedures   Medications Ordered in ED Medications - No data to display  ED Course  I have  reviewed the triage vital signs and the nursing notes.  Pertinent labs & imaging results that were available during my care of the patient were reviewed by me and considered in my medical decision making (see chart for details).    MDM Rules/Calculators/A&P  35 year old male presents with concern for left mid back pain x2 days.   Differential diagnosis includes but is not limited to acute muscular strain, trauma, pyelonephritis, nephrolithiasis, pneumonia, herpes zoster.  Hypertensive on intake, vital signs otherwise normal.  Cardiopulmonary exam is normal, abdominal exam is benign.  Musculoskeletal exam did reveal left thoracic paraspinous muscular spasm and tenderness palpation without skin changes, erythema, bruising, or CVA tenderness.  UA obtained in triage with moderate hemoglobin but no other signs concerning for infection.  Patient's physical exam and HPI most consistent with muscular spasm and strain.  Lidoderm patch applied, Toradol offered but patient declined.  Work note provided.  No further work-up warranted in ED at this time.  Glover voiced understanding of his medical evaluation and treatment plan.  Each of his questions answered to his expressed affection.  Return precautions were given.  Patient is well-appearing, stable, appropriate for discharge at this time.  This chart was dictated using voice recognition software, Dragon. Despite the best efforts of this provider to proofread and correct errors, errors may still occur which can change documentation meaning.   Final Clinical Impression(s) / ED Diagnoses Final diagnoses:  Acute left-sided thoracic back pain    Rx / DC Orders ED Discharge Orders          Ordered    lidocaine (LIDODERM) 5 %  Every 24 hours        12/16/20 2059             Mostafa Yuan, Eugene Gavia, PA-C 12/20/20 1011    Mancel Bale, MD 12/21/20 [phone]

## 2020-12-21 ENCOUNTER — Other Ambulatory Visit: Payer: Self-pay | Admitting: Internal Medicine

## 2020-12-21 DIAGNOSIS — I10 Essential (primary) hypertension: Secondary | ICD-10-CM

## 2020-12-22 NOTE — Telephone Encounter (Signed)
Please advise on refill. No PCP listed  

## 2021-05-31 ENCOUNTER — Emergency Department (HOSPITAL_COMMUNITY): Payer: Self-pay

## 2021-05-31 ENCOUNTER — Emergency Department (HOSPITAL_COMMUNITY)
Admission: EM | Admit: 2021-05-31 | Discharge: 2021-05-31 | Disposition: A | Discharge status: Home / Self Care | Payer: Self-pay | Attending: Emergency Medicine | Admitting: Emergency Medicine

## 2021-05-31 ENCOUNTER — Encounter (HOSPITAL_COMMUNITY): Payer: Self-pay | Admitting: Emergency Medicine

## 2021-05-31 DIAGNOSIS — Z91010 Allergy to peanuts: Secondary | ICD-10-CM | POA: Insufficient documentation

## 2021-05-31 DIAGNOSIS — R42 Dizziness and giddiness: Secondary | ICD-10-CM | POA: Insufficient documentation

## 2021-05-31 DIAGNOSIS — Z79899 Other long term (current) drug therapy: Secondary | ICD-10-CM | POA: Insufficient documentation

## 2021-05-31 DIAGNOSIS — Z21 Asymptomatic human immunodeficiency virus [HIV] infection status: Secondary | ICD-10-CM | POA: Insufficient documentation

## 2021-05-31 DIAGNOSIS — Z9114 Patient's other noncompliance with medication regimen: Secondary | ICD-10-CM | POA: Insufficient documentation

## 2021-05-31 DIAGNOSIS — I10 Essential (primary) hypertension: Secondary | ICD-10-CM | POA: Insufficient documentation

## 2021-05-31 DIAGNOSIS — R112 Nausea with vomiting, unspecified: Secondary | ICD-10-CM | POA: Insufficient documentation

## 2021-05-31 LAB — DIFFERENTIAL
Abs Immature Granulocytes: 0.02 10*3/uL (ref 0.00–0.07)
Basophils Absolute: 0 10*3/uL (ref 0.0–0.1)
Basophils Relative: 0 %
Eosinophils Absolute: 0 10*3/uL (ref 0.0–0.5)
Eosinophils Relative: 1 %
Immature Granulocytes: 0 %
Lymphocytes Relative: 13 %
Lymphs Abs: 0.7 10*3/uL (ref 0.7–4.0)
Monocytes Absolute: 0.6 10*3/uL (ref 0.1–1.0)
Monocytes Relative: 10 %
Neutro Abs: 4.4 10*3/uL (ref 1.7–7.7)
Neutrophils Relative %: 76 %

## 2021-05-31 LAB — RAPID URINE DRUG SCREEN, HOSP PERFORMED
Amphetamines: NOT DETECTED
Barbiturates: NOT DETECTED
Benzodiazepines: NOT DETECTED
Cocaine: NOT DETECTED
Opiates: NOT DETECTED
Tetrahydrocannabinol: POSITIVE — AB

## 2021-05-31 LAB — CBC
HCT: 36.6 % — ABNORMAL LOW (ref 39.0–52.0)
Hemoglobin: 12.9 g/dL — ABNORMAL LOW (ref 13.0–17.0)
MCH: 30.6 pg (ref 26.0–34.0)
MCHC: 35.2 g/dL (ref 30.0–36.0)
MCV: 86.9 fL (ref 80.0–100.0)
Platelets: 203 10*3/uL (ref 150–400)
RBC: 4.21 MIL/uL — ABNORMAL LOW (ref 4.22–5.81)
RDW: 13.5 % (ref 11.5–15.5)
WBC: 5.7 10*3/uL (ref 4.0–10.5)
nRBC: 0 % (ref 0.0–0.2)

## 2021-05-31 LAB — ETHANOL: Alcohol, Ethyl (B): <10 mg/dL (ref <10)

## 2021-05-31 LAB — COMPREHENSIVE METABOLIC PANEL
ALT: 22 U/L (ref 0–44)
AST: 23 U/L (ref 15–41)
Albumin: 4.3 g/dL (ref 3.5–5.0)
Alkaline Phosphatase: 56 U/L (ref 38–126)
Anion gap: 8 (ref 5–15)
BUN: 12 mg/dL (ref 6–20)
CO2: 27 mmol/L (ref 22–32)
Calcium: 9.1 mg/dL (ref 8.9–10.3)
Chloride: 105 mmol/L (ref 98–111)
Creatinine, Ser: 1.2 mg/dL (ref 0.61–1.24)
GFR, Estimated: >60 mL/min (ref >60)
Glucose, Bld: 95 mg/dL (ref 70–99)
Potassium: 3.4 mmol/L — ABNORMAL LOW (ref 3.5–5.1)
Sodium: 140 mmol/L (ref 135–145)
Total Bilirubin: 0.6 mg/dL (ref 0.3–1.2)
Total Protein: 8.3 g/dL — ABNORMAL HIGH (ref 6.5–8.1)

## 2021-05-31 MED ORDER — MECLIZINE HCL 25 MG PO TABS: 25.0000 mg | ORAL_TABLET | Freq: Three times a day (TID) | ORAL | 0 refills | Status: DC | PRN

## 2021-05-31 MED ORDER — ONDANSETRON HCL 4 MG/2ML IJ SOLN
4.0000 mg | Freq: Once | INTRAMUSCULAR | Status: AC
Start: 2021-05-31 — End: 2021-05-31
  Administered 2021-05-31: 4 mg via INTRAVENOUS
  Filled 2021-05-31: qty 2

## 2021-05-31 MED ORDER — DIAZEPAM 5 MG/ML IJ SOLN
5.0000 mg | Freq: Once | INTRAMUSCULAR | Status: AC
  Administered 2021-05-31: 5 mg via INTRAVENOUS
  Filled 2021-05-31: qty 2

## 2021-05-31 MED ORDER — MECLIZINE HCL 25 MG PO TABS
25.0000 mg | ORAL_TABLET | Freq: Once | ORAL | Status: AC
Start: 2021-05-31 — End: 2021-05-31
  Administered 2021-05-31: 25 mg via ORAL
  Filled 2021-05-31: qty 1

## 2021-05-31 NOTE — ED Triage Notes (Signed)
Pt states he woke up this morning to go to the bathroom and began to get dizzy. Pt states it feels like the room is spinning, states when he opens his eyes he has nausea and vomiting.  ?

## 2021-05-31 NOTE — ED Notes (Signed)
Patient transported to MRI 

## 2021-05-31 NOTE — Discharge Instructions (Signed)
Your MRI today showed a possible vessel in your brain that could be abnormal.  Call the neurologist to schedule a follow-up visit ?

## 2021-05-31 NOTE — ED Triage Notes (Addendum)
PT here from [facility]  Acute onset dizziness.  Sent here for MRI.  Received 5 valium at [facility] for dizziness.  Pt states mild headache.  States he has not taken bp meds x 1 month. ? ?170/100 ?Sats 100% ?Hr 60 ? ? ?GCS 15 ?

## 2021-05-31 NOTE — ED Provider Notes (Signed)
Care of patient assumed from Dr. Bebe Shaggy at 7:30 AM.  This patient has a history of HIV and hypertension.  He has a history of medication noncompliance but states that he has been taking his medications recently.  He had the cute onset of vertigo, nausea, and vomiting yesterday.  He is currently awaiting results of CT head.  He has gotten Zofran and meclizine.  Follow-up on results of CT scan and reassess patient's symptoms.  If persistent, consider transfer for MRI. ?Physical Exam  ?BP (!) 169/112   Pulse 61   Temp 98.1 ?F (36.7 ?C) (Oral)   Resp 17   Ht 5\' 7"  (1.702 m)   Wt 90.7 kg   SpO2 97%   BMI 31.32 kg/m?  ? ?Physical Exam ?Constitutional:   ?   General: He is not in acute distress. ?   Appearance: Normal appearance. He is normal weight. He is not ill-appearing, toxic-appearing or diaphoretic.  ?HENT:  ?   Head: Normocephalic and atraumatic.  ?   Right Ear: Tympanic membrane, ear canal and external ear normal.  ?   Left Ear: Tympanic membrane, ear canal and external ear normal.  ?   Nose: Nose normal.  ?Eyes:  ?   General: No visual field deficit. ?   Extraocular Movements: Extraocular movements intact.  ?   Conjunctiva/sclera: Conjunctivae normal.  ?   Comments: Leftward nystagmus, present on leftward gaze, does not extinguish  ?Pulmonary:  ?   Effort: Pulmonary effort is normal. No respiratory distress.  ?Abdominal:  ?   General: There is no distension.  ?   Palpations: Abdomen is soft.  ?Musculoskeletal:  ?   Cervical back: Normal range of motion. No rigidity.  ?Skin: ?   General: Skin is warm and dry.  ?Neurological:  ?   Mental Status: He is alert and oriented to person, place, and time.  ?   Cranial Nerves: Cranial nerves 2-12 are intact. No cranial nerve deficit, dysarthria or facial asymmetry.  ?   Sensory: Sensation is intact. No sensory deficit.  ?   Motor: Motor function is intact. No weakness or abnormal muscle tone.  ?Psychiatric:     ?   Mood and Affect: Mood normal.     ?   Behavior:  Behavior normal.  ? ? ?Procedures  ?Procedures ? ?ED Course / MDM  ? ?Clinical Course as of 05/31/21 0738  ?06/02/21 May 31, 2021  ?0608 Patient history of HIV and hypertension presents with dizziness.  His history appears consistent with vertigo.  He has no focal neurodeficits.  Symptoms been ongoing for up to 13 hours. [DW]  ?0609 Per infectious disease notes, patient has intermittent compliance with medications last CD4 count was 285 in August 2022.  Patient reports he has been recently compliant [DW]  ?0609 Plan will be to start meds, check labs and obtain a CT head for screening purposes due to significantly elevated blood pressure with dizziness.  If this is negative and patient has not improved, he will likely require MRI [DW]  ?0736 Signed out to dr 0610 at shift change to f/u on imaging. If no improvement, may need MRI brain [DW]  ?  ?Clinical Course User Index ?[DW] Durwin Nora, MD  ? ?Medical Decision Making ?Amount and/or Complexity of Data Reviewed ?Labs: ordered. ?Radiology: ordered. ? ?Risk ?Prescription drug management. ? ? ?On reassessment, patient continues to endorse persistent vertigo.  He describes this as follows: When he closes his eyes he continues to feel dizzy.  When he opens his eyes, there is a persistent rightward moving of any objects that he focuses on.  He states that his nausea has improved but his dizziness has not.  On exam, he does have a left beat nystagmus present on leftward gaze that does not extinguish.  He does not have nystagmus with rightward gaze.  Due to the persistence of his symptoms, plan will be for transfer to [facility] for MRI imaging.  Neurology was consulted.  I discussed with Dr. Selina Cooley who advises MRI studies.  If positive, neurology to be reconsulted. While here in the ED, patient was given Valium for further efforts to relieve symptoms.  On reassessment, patient reports continued symptoms.  Patient was transported to [facility] for MRI studies.  He was  accepted in transfer by Dr. Karene Fry. ? ? ? ? ?  ?Gloris Manchester, MD ?05/31/21 1257 ? ?

## 2021-05-31 NOTE — ED Notes (Signed)
Spoke with [facility] to setup transportation at this time. Dr. Armandina Gemma accepting EDP.  ?

## 2021-05-31 NOTE — ED Notes (Signed)
Pt ambulated to bathroom with a steady gait

## 2021-05-31 NOTE — ED Provider Notes (Signed)
?[place] EMERGENCY DEPARTMENT ?Provider Note ? ? ?CSN: [id number] ?Arrival date & time: 05/31/21  0308 ? ?  ? ?History ? ?Chief Complaint  ?Patient presents with  ? Dizziness  ? ? ?Phillip Morales is a 36 y.o. male. ? ?The history is provided by the patient.  ?Dizziness ?Quality:  Room spinning ?Severity:  Severe ?Onset quality:  Sudden ?Duration:  13 hours ?Timing:  Intermittent ?Progression:  Worsening ?Chronicity:  New ?Context: head movement   ?Relieved by:  Being still ?Worsened by:  Movement ?Associated symptoms: nausea and vomiting   ?Associated symptoms: no chest pain, no headaches, no hearing loss, no shortness of breath, no syncope, no tinnitus, no vision changes and no weakness   ?Risk factors: no hx of stroke   ?Patient with history of HIV and hypertension presents with dizziness.  Patient reports that started over 13 hours ago.  He reports it feels that the room is spinning.  He has had associated nausea and vomiting.  No focal weakness.  He has never had this before.  Denies history of stroke.  He reports medication compliance ?  ? ?Home Medications ?Prior to Admission medications   ?Medication Sig Start Date End Date Taking? Authorizing Provider  ?amLODipine (NORVASC) 5 MG tablet TAKE 1 TABLET(5 MG) BY MOUTH DAILY 10/28/20   Comer, Belia Heman, MD  ?bictegravir-emtricitabine-tenofovir AF (BIKTARVY) 50-200-25 MG TABS tablet Take 1 tablet by mouth daily. 10/28/20   Gardiner Barefoot, MD  ?bictegravir-emtricitabine-tenofovir AF (BIKTARVY) 50-200-25 MG TABS tablet Take 1 tablet by mouth daily. 11/04/20   Kuppelweiser, Cassie L, RPH-CPP  ?lidocaine (LIDODERM) 5 % Place 1 patch onto the skin daily. Remove & Discard patch within 12 hours or as directed by MD 12/16/20   Sponseller, Eugene Gavia, PA-C  ?mupirocin cream (BACTROBAN) 2 % Apply 1 application topically 2 (two) times daily. ?Patient not taking: Reported on 10/28/2020 06/13/19   Arlyn Dunning, PA-C  ?   ? ?Allergies    ?Peanut-containing drug products   ? ?Review  of Systems   ?Review of Systems  ?Constitutional:  Negative for fever.  ?HENT:  Negative for hearing loss and tinnitus.   ?Respiratory:  Negative for shortness of breath.   ?Cardiovascular:  Negative for chest pain and syncope.  ?Gastrointestinal:  Positive for nausea and vomiting.  ?Neurological:  Positive for dizziness. Negative for syncope, weakness and headaches.  ?All other systems reviewed and are negative. ? ?Physical Exam ?Updated Vital Signs ?BP (!) 181/124 (BP Location: Left Arm) Comment: pt states he has been out of his HTN meds for the past few weeks  Pulse 73   Temp 98.1 ?F (36.7 ?C) (Oral)   Resp 19   Ht 1.702 m (5\' 7" )   Wt 90.7 kg   SpO2 98%   BMI 31.32 kg/m?  ?Physical Exam ?CONSTITUTIONAL: Well developed/well nourished, uncomfortable appearing ?HEAD: Normocephalic/atraumatic ?EYES: EOMI/PERRL, horizontal nystagmus is noted, no ptosis ?ENMT: Mucous membranes moist ?NECK: supple no meningeal signs, no bruits ?CV: S1/S2 noted, no murmurs/rubs/gallops noted ?LUNGS: Lungs are clear to auscultation bilaterally, no apparent distress ?ABDOMEN: soft, nontender, no rebound or guarding ?GU:no cva tenderness ?NEURO:Awake/alert, face symmetric, no arm or leg drift is noted ?Equal 5/5 strength with shoulder abduction, elbow flex/extension, wrist flex/extension in upper extremities and equal hand grips bilaterally ?Equal 5/5 strength with hip flexion,knee flex/extension, foot dorsi/plantar flexion ?Cranial nerves 3/4/5/6/09/20/08/11/12 tested and intact ?Gait normal without ataxia ?No past pointing ?Sensation to light touch intact in all extremities ?EXTREMITIES: pulses normal, full  ROM ?SKIN: warm, color normal ?PSYCH: no abnormalities of mood noted ? ?ED Results / Procedures / Treatments   ?Labs ?(all labs ordered are listed, but only abnormal results are displayed) ?Labs Reviewed  ?CBC - Abnormal; Notable for the following components:  ?    Result Value  ? RBC 4.21 (*)   ? Hemoglobin 12.9 (*)   ? HCT 36.6  (*)   ? All other components within normal limits  ?COMPREHENSIVE METABOLIC PANEL - Abnormal; Notable for the following components:  ? Potassium 3.4 (*)   ? Total Protein 8.3 (*)   ? All other components within normal limits  ?ETHANOL  ?DIFFERENTIAL  ?RAPID URINE DRUG SCREEN, HOSP PERFORMED  ? ? ?EKG ?EKG Interpretation ? ?Date/Time:  Sunday May 31 2021 05:06:04 EDT ?Ventricular Rate:  70 ?PR Interval:  157 ?QRS Duration: 101 ?QT Interval:  383 ?QTC Calculation: 414 ?R Axis:   39 ?Text Interpretation: Sinus rhythm RSR' in V1 or V2, probably normal variant Confirmed by Zadie Rhine ([id number]) on 05/31/2021 6:03:41 AM ? ?Radiology ?No results found. ? ?Procedures ?Procedures  ? ? ?Medications Ordered in ED ?Medications  ?ondansetron (ZOFRAN) injection 4 mg (4 mg Intravenous Given 05/31/21 3785)  ?meclizine (ANTIVERT) tablet 25 mg (25 mg Oral Given 05/31/21 8850)  ? ? ?ED Course/ Medical Decision Making/ A&P ?Clinical Course as of 05/31/21 0737  ?Wynelle Link May 31, 2021  ?0608 Patient history of HIV and hypertension presents with dizziness.  His history appears consistent with vertigo.  He has no focal neurodeficits.  Symptoms been ongoing for up to 13 hours. [DW]  ?0609 Per infectious disease notes, patient has intermittent compliance with medications last CD4 count was 285 in August 2022.  Patient reports he has been recently compliant [DW]  ?0609 Plan will be to start meds, check labs and obtain a CT head for screening purposes due to significantly elevated blood pressure with dizziness.  If this is negative and patient has not improved, he will likely require MRI [DW]  ?0736 Signed out to dr Durwin Nora at shift change to f/u on imaging. If no improvement, may need MRI brain [DW]  ?  ?Clinical Course User Index ?[DW] Zadie Rhine, MD  ? ?                        ?Medical Decision Making ?Amount and/or Complexity of Data Reviewed ?Labs: ordered. ?Radiology: ordered. ? ?Risk ?Prescription drug management. ? ? ?This patient  presents to the ED for concern of dizziness, this involves an extensive number of treatment options, and is a complaint that carries with it a high risk of complications and morbidity.  The differential diagnosis includes vertigo, stroke, intracranial hemorrhage, intracranial abscess, electrolyte abnormality ? ?Comorbidities that complicate the patient evaluation: ?Patient?s presentation is complicated by their history of HIV ? ?Social Determinants of Health: ?Patient?s impaired access to primary care  increases the complexity of managing their presentation ? ?Additional history obtained: ? ?Records reviewed  infectious disease notes reviewed ? ?Lab Tests: ?I Ordered, and personally interpreted labs.  The pertinent results include: Labs are unremarkable ? ? ?Cardiac Monitoring: ?The patient was maintained on a cardiac monitor.  I personally viewed and interpreted the cardiac monitor which showed an underlying rhythm of:  sinus rhythm ? ?Medicines ordered and prescription drug management: ?I ordered medication including Zofran and meclizine for vertigo ?Reevaluation of the patient after these medicines showed that the patient    improved ? ?Reevaluation: ?After the interventions  noted above, I reevaluated the patient and found that they have :improved ? ?Complexity of problems addressed: ?Patient?s presentation is most consistent with  acute presentation with potential threat to life or bodily function ? ? ? ? ? ? ? ? ? ?Final Clinical Impression(s) / ED Diagnoses ?Final diagnoses:  ?Vertigo  ? ? ?Rx / DC Orders ?ED Discharge Orders   ? ? None  ? ?  ? ? ?  ?Zadie RhineWickline, Meiling Hendriks, MD ?05/31/21 [phone] ? ?

## 2021-05-31 NOTE — ED Provider Notes (Signed)
Patient transferred from other facility for MRI to rule out stroke as etiology for new dizziness.  Patient reportedly felt better after having some meclizine but was still feeling dizzy. ? ?After MRI, if no stroke is seen anticipate discharge with meclizine and PCP follow-up. ?  ?Phillip Morales, Canary Brim, MD ?05/31/21 1539 ? ?

## 2021-05-31 NOTE — ED Provider Notes (Signed)
Patient signed to me by Dr. Julieanne Manson pending MRI results.  MRI did not show any acute abnormalities.  Patient's dizziness has improved and will discharge home.  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  ?Lorre Nick, MD ?05/31/21 1711 ? ?

## 2021-07-03 ENCOUNTER — Other Ambulatory Visit: Payer: Self-pay

## 2021-07-03 ENCOUNTER — Ambulatory Visit: Payer: Self-pay

## 2021-07-03 DIAGNOSIS — Z113 Encounter for screening for infections with a predominantly sexual mode of transmission: Secondary | ICD-10-CM

## 2021-07-03 DIAGNOSIS — Z79899 Other long term (current) drug therapy: Secondary | ICD-10-CM

## 2021-07-03 DIAGNOSIS — B20 Human immunodeficiency virus [HIV] disease: Secondary | ICD-10-CM

## 2021-07-04 ENCOUNTER — Other Ambulatory Visit: Payer: Self-pay | Admitting: Internal Medicine

## 2021-07-04 DIAGNOSIS — I10 Essential (primary) hypertension: Secondary | ICD-10-CM

## 2021-07-06 LAB — URINE CYTOLOGY ANCILLARY ONLY
Chlamydia: NEGATIVE
Comment: NEGATIVE
Comment: NORMAL
Neisseria Gonorrhea: NEGATIVE

## 2021-07-08 LAB — CBC WITH DIFFERENTIAL/PLATELET
Absolute Monocytes: 385 cells/uL (ref 200–950)
Basophils Absolute: 19 cells/uL (ref 0–200)
Basophils Relative: 0.5 %
Eosinophils Absolute: 41 cells/uL (ref 15–500)
Eosinophils Relative: 1.1 %
HCT: 38.3 % — ABNORMAL LOW (ref 38.5–50.0)
Hemoglobin: 13.2 g/dL (ref 13.2–17.1)
Lymphs Abs: 962 cells/uL (ref 850–3900)
MCH: 30.3 pg (ref 27.0–33.0)
MCHC: 34.5 g/dL (ref 32.0–36.0)
MCV: 88 fL (ref 80.0–100.0)
MPV: 11.1 fL (ref 7.5–12.5)
Monocytes Relative: 10.4 %
Neutro Abs: 2294 cells/uL (ref 1500–7800)
Neutrophils Relative %: 62 %
Platelets: 201 10*3/uL (ref 140–400)
RBC: 4.35 10*6/uL (ref 4.20–5.80)
RDW: 13.2 % (ref 11.0–15.0)
Total Lymphocyte: 26 %
WBC: 3.7 10*3/uL — ABNORMAL LOW (ref 3.8–10.8)

## 2021-07-08 LAB — COMPLETE METABOLIC PANEL WITH GFR
AG Ratio: 1.2 (calc) (ref 1.0–2.5)
ALT: 29 U/L (ref 9–46)
AST: 21 U/L (ref 10–40)
Albumin: 4.2 g/dL (ref 3.6–5.1)
Alkaline phosphatase (APISO): 56 U/L (ref 36–130)
BUN: 12 mg/dL (ref 7–25)
CO2: 27 mmol/L (ref 20–32)
Calcium: 9.3 mg/dL (ref 8.6–10.3)
Chloride: 107 mmol/L (ref 98–110)
Creat: 1.24 mg/dL (ref 0.60–1.26)
Globulin: 3.6 g/dL (calc) (ref 1.9–3.7)
Glucose, Bld: 99 mg/dL (ref 65–99)
Potassium: 3.9 mmol/L (ref 3.5–5.3)
Sodium: 139 mmol/L (ref 135–146)
Total Bilirubin: 0.4 mg/dL (ref 0.2–1.2)
Total Protein: 7.8 g/dL (ref 6.1–8.1)
eGFR: 77 mL/min/{1.73_m2} (ref >=60)

## 2021-07-08 LAB — LIPID PANEL
Cholesterol: 190 mg/dL (ref <200)
HDL: 31 mg/dL — ABNORMAL LOW (ref >=40)
LDL Cholesterol (Calc): 127 mg/dL (calc) — ABNORMAL HIGH
Non-HDL Cholesterol (Calc): 159 mg/dL (calc) — ABNORMAL HIGH (ref <130)
Total CHOL/HDL Ratio: 6.1 (calc) — ABNORMAL HIGH (ref <5.0)
Triglycerides: 196 mg/dL — ABNORMAL HIGH (ref <150)

## 2021-07-08 LAB — HIV-1 RNA QUANT-NO REFLEX-BLD
HIV 1 RNA Quant: NOT DETECTED copies/mL
HIV-1 RNA Quant, Log: NOT DETECTED Log copies/mL

## 2021-07-08 LAB — RPR TITER: RPR Titer: 1:2 {titer} — ABNORMAL HIGH

## 2021-07-08 LAB — T-HELPER CELLS (CD4) COUNT (NOT AT ARMC)
Absolute CD4: 278 cells/uL — ABNORMAL LOW (ref 490–1740)
CD4 T Helper %: 27 % — ABNORMAL LOW (ref 30–61)
Total lymphocyte count: 1016 cells/uL (ref 850–3900)

## 2021-07-08 LAB — RPR: RPR Ser Ql: REACTIVE — AB

## 2021-07-08 LAB — FLUORESCENT TREPONEMAL AB(FTA)-IGG-BLD: Fluorescent Treponemal ABS: REACTIVE — AB

## 2021-07-23 ENCOUNTER — Encounter: Payer: Self-pay | Admitting: Internal Medicine

## 2021-11-13 ENCOUNTER — Other Ambulatory Visit: Payer: Self-pay | Admitting: Internal Medicine

## 2021-11-17 ENCOUNTER — Other Ambulatory Visit: Payer: Self-pay

## 2021-11-17 NOTE — Telephone Encounter (Signed)
States he has enough medication to last until his appt. 9/13.  Sandie Ano, RN

## 2021-11-25 ENCOUNTER — Ambulatory Visit: Payer: Self-pay | Admitting: Pharmacist

## 2021-12-10 ENCOUNTER — Other Ambulatory Visit: Payer: Self-pay | Admitting: Internal Medicine

## 2021-12-10 ENCOUNTER — Telehealth: Payer: Self-pay

## 2021-12-10 NOTE — Telephone Encounter (Signed)
Called patient regarding refill request for Biktarvy. Patient has not been seen in office since August 2022. Refills denied at this time. Will need to schedule appointment for labs and follow up.  Leatrice Jewels, RMA

## 2022-01-21 ENCOUNTER — Ambulatory Visit: Payer: Self-pay

## 2022-01-21 ENCOUNTER — Ambulatory Visit: Payer: Self-pay | Admitting: Internal Medicine

## 2022-01-21 ENCOUNTER — Other Ambulatory Visit: Payer: Self-pay

## 2022-01-21 DIAGNOSIS — B20 Human immunodeficiency virus [HIV] disease: Secondary | ICD-10-CM

## 2022-01-21 DIAGNOSIS — Z113 Encounter for screening for infections with a predominantly sexual mode of transmission: Secondary | ICD-10-CM

## 2022-01-22 ENCOUNTER — Other Ambulatory Visit: Payer: Self-pay | Admitting: Internal Medicine

## 2022-01-22 DIAGNOSIS — I10 Essential (primary) hypertension: Secondary | ICD-10-CM

## 2022-01-22 LAB — T-HELPER CELL (CD4) - (RCID CLINIC ONLY)
CD4 % Helper T Cell: 26 % — ABNORMAL LOW (ref 33–65)
CD4 T Cell Abs: 227 /uL — ABNORMAL LOW (ref 400–1790)

## 2022-01-22 LAB — URINE CYTOLOGY ANCILLARY ONLY
Chlamydia: NEGATIVE
Comment: NEGATIVE
Comment: NORMAL
Neisseria Gonorrhea: NEGATIVE

## 2022-01-25 LAB — HIV-1 RNA QUANT-NO REFLEX-BLD
HIV 1 RNA Quant: <20 Copies/mL — ABNORMAL HIGH
HIV-1 RNA Quant, Log: <1.3 Log cps/mL — ABNORMAL HIGH

## 2022-01-25 LAB — FLUORESCENT TREPONEMAL AB(FTA)-IGG-BLD: Fluorescent Treponemal ABS: REACTIVE — AB

## 2022-01-25 LAB — RPR TITER: RPR Titer: 1:64 {titer} — ABNORMAL HIGH

## 2022-01-25 LAB — RPR: RPR Ser Ql: REACTIVE — AB

## 2022-01-27 ENCOUNTER — Encounter: Payer: Self-pay | Admitting: Internal Medicine

## 2022-01-27 ENCOUNTER — Other Ambulatory Visit: Payer: Self-pay

## 2022-01-27 ENCOUNTER — Ambulatory Visit (INDEPENDENT_AMBULATORY_CARE_PROVIDER_SITE_OTHER): Payer: Self-pay | Admitting: Internal Medicine

## 2022-01-27 VITALS — BP 178/117 | HR 75 | Temp 97.9°F | Ht 67.0 in | Wt 207.0 lb

## 2022-01-27 DIAGNOSIS — Z23 Encounter for immunization: Secondary | ICD-10-CM

## 2022-01-27 DIAGNOSIS — Z113 Encounter for screening for infections with a predominantly sexual mode of transmission: Secondary | ICD-10-CM

## 2022-01-27 DIAGNOSIS — I10 Essential (primary) hypertension: Secondary | ICD-10-CM

## 2022-01-27 DIAGNOSIS — B20 Human immunodeficiency virus [HIV] disease: Secondary | ICD-10-CM

## 2022-01-27 DIAGNOSIS — Z79899 Other long term (current) drug therapy: Secondary | ICD-10-CM

## 2022-01-27 DIAGNOSIS — F17200 Nicotine dependence, unspecified, uncomplicated: Secondary | ICD-10-CM

## 2022-01-27 MED ORDER — AMLODIPINE BESYLATE 5 MG PO TABS: ORAL_TABLET | ORAL | 5 refills | Status: DC

## 2022-01-27 MED ORDER — BICTEGRAVIR-EMTRICITAB-TENOFOV 50-200-25 MG PO TABS: 1.0000 | ORAL_TABLET | Freq: Every day | ORAL | 11 refills | Status: DC

## 2022-01-27 NOTE — Assessment & Plan Note (Signed)
Will screen today 

## 2022-01-27 NOTE — Assessment & Plan Note (Signed)
He continues to do well despite sporadic follow up.  Previous labs reviewed and will check today.  He can follow up in 6 months.  Cabenuva discussed as well and he will call back if interested.

## 2022-01-27 NOTE — Progress Notes (Signed)
   Subjective:    Patient ID: [name], male    DOB: May 28, 1985, 36 y.o.   MRN: [id number]  HPI He is here for follow up of HIV He continues with sporadic follow up but has been doing well and taking his medication.   He had labs done in April this year and he was undetectable and no issues, though missed his appointment with me.  He has had recent sexual activity and has been treated for syphilis.  Asking about Cabenuva.  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  Interested in quitting smoking. Vaping some to decrease cravings.     Review of Systems  Constitutional:  Negative for fatigue.  Gastrointestinal:  Negative for diarrhea.  Skin:  Negative for rash.       Objective:   Physical Exam Eyes:     General: No scleral icterus. Pulmonary:     Effort: Pulmonary effort is normal.  Neurological:     Mental Status: He is alert.  Psychiatric:        Mood and Affect: Mood normal.    SH: + tobacco       Assessment & Plan:

## 2022-01-27 NOTE — Assessment & Plan Note (Signed)
High today.  Amlodipine refilled and he will return to his pcp

## 2022-01-27 NOTE — Addendum Note (Signed)
Addended by: Philippa Chester on: 01/27/2022 11:43 AM   Modules accepted: Orders

## 2022-01-27 NOTE — Assessment & Plan Note (Signed)
Discussed continued cessation effortsw.

## 2022-01-28 ENCOUNTER — Telehealth: Payer: Self-pay

## 2022-01-28 LAB — URINE CYTOLOGY ANCILLARY ONLY
Chlamydia: NEGATIVE
Comment: NEGATIVE
Comment: NORMAL
Neisseria Gonorrhea: NEGATIVE

## 2022-01-28 LAB — CYTOLOGY, (ORAL, ANAL, URETHRAL) ANCILLARY ONLY
Chlamydia: NEGATIVE
Comment: NEGATIVE
Comment: NORMAL
Neisseria Gonorrhea: NEGATIVE

## 2022-01-28 NOTE — Telephone Encounter (Signed)
Called patient to relay results and schedule for treatment, no answer and voicemail not set up.   Sandie Ano, RN

## 2022-01-28 NOTE — Telephone Encounter (Signed)
Received call from Neta Mends with [facility] inquiring about patient's RPR results. She spoke with the patient who reports mouth sores and one sore in the genital area.   RPR 1:64 on 01/21/22. Will route to provider for treatment orders.    Latest Reference Range & Units 08/22/19 09:16 07/03/21 10:54 01/21/22 10:08  RPR NON-REACTIVE  REACTIVE ! REACTIVE ! REACTIVE !  RPR Titer  1:4 (H) 1:2 (H) 1:64 (H)  Fluorescent Treponemal ABS NON-REACTIVE  REACTIVE ! REACTIVE ! REACTIVE !    Sandie Ano, RN

## 2022-01-29 LAB — CYTOLOGY, (ORAL, ANAL, URETHRAL) ANCILLARY ONLY
Comment: NEGATIVE
Comment: NORMAL

## 2022-01-29 NOTE — Telephone Encounter (Signed)
Patient returned phone call and I discussed lab results with him. Patient scheduled for Bicillin 2.4 MU x 1 on 02/01/22

## 2022-02-01 ENCOUNTER — Other Ambulatory Visit: Payer: Self-pay

## 2022-02-01 ENCOUNTER — Ambulatory Visit (INDEPENDENT_AMBULATORY_CARE_PROVIDER_SITE_OTHER): Payer: Self-pay

## 2022-02-01 DIAGNOSIS — A539 Syphilis, unspecified: Secondary | ICD-10-CM

## 2022-02-01 MED ORDER — PENICILLIN G BENZATHINE 1200000 UNIT/2ML IM SUSY
2.4000 10*6.[IU] | PREFILLED_SYRINGE | Freq: Once | INTRAMUSCULAR | Status: AC
  Administered 2022-02-01: 2.4 10*6.[IU] via INTRAMUSCULAR

## 2022-07-14 ENCOUNTER — Other Ambulatory Visit: Payer: Self-pay

## 2022-07-14 ENCOUNTER — Other Ambulatory Visit: Payer: Medicaid Other

## 2022-07-14 DIAGNOSIS — B20 Human immunodeficiency virus [HIV] disease: Secondary | ICD-10-CM

## 2022-07-14 DIAGNOSIS — Z79899 Other long term (current) drug therapy: Secondary | ICD-10-CM

## 2022-07-14 DIAGNOSIS — Z113 Encounter for screening for infections with a predominantly sexual mode of transmission: Secondary | ICD-10-CM

## 2022-07-16 LAB — LIPID PANEL
Cholesterol: 174 mg/dL (ref <200)
HDL: 31 mg/dL — ABNORMAL LOW (ref >=40)
LDL Cholesterol (Calc): 112 mg/dL (calc) — ABNORMAL HIGH
Non-HDL Cholesterol (Calc): 143 mg/dL (calc) — ABNORMAL HIGH (ref <130)
Total CHOL/HDL Ratio: 5.6 (calc) — ABNORMAL HIGH (ref <5.0)
Triglycerides: 195 mg/dL — ABNORMAL HIGH (ref <150)

## 2022-07-16 LAB — CBC WITH DIFFERENTIAL/PLATELET
Absolute Monocytes: 499 cells/uL (ref 200–950)
Basophils Absolute: 22 cells/uL (ref 0–200)
Basophils Relative: 0.5 %
Eosinophils Absolute: 82 cells/uL (ref 15–500)
Eosinophils Relative: 1.9 %
HCT: 35.2 % — ABNORMAL LOW (ref 38.5–50.0)
Hemoglobin: 12 g/dL — ABNORMAL LOW (ref 13.2–17.1)
Lymphs Abs: 1114 cells/uL (ref 850–3900)
MCH: 29.8 pg (ref 27.0–33.0)
MCHC: 34.1 g/dL (ref 32.0–36.0)
MCV: 87.3 fL (ref 80.0–100.0)
MPV: 11.1 fL (ref 7.5–12.5)
Monocytes Relative: 11.6 %
Neutro Abs: 2584 cells/uL (ref 1500–7800)
Neutrophils Relative %: 60.1 %
Platelets: 231 10*3/uL (ref 140–400)
RBC: 4.03 10*6/uL — ABNORMAL LOW (ref 4.20–5.80)
RDW: 13 % (ref 11.0–15.0)
Total Lymphocyte: 25.9 %
WBC: 4.3 10*3/uL (ref 3.8–10.8)

## 2022-07-16 LAB — T-HELPER CELL (CD4) - (RCID CLINIC ONLY)
CD4 % Helper T Cell: 27 % — ABNORMAL LOW (ref 33–65)
CD4 T Cell Abs: 298 /uL — ABNORMAL LOW (ref 400–1790)

## 2022-07-16 LAB — COMPLETE METABOLIC PANEL WITH GFR
AG Ratio: 1.2 (calc) (ref 1.0–2.5)
ALT: 15 U/L (ref 9–46)
AST: 20 U/L (ref 10–40)
Albumin: 4.3 g/dL (ref 3.6–5.1)
Alkaline phosphatase (APISO): 62 U/L (ref 36–130)
BUN: 12 mg/dL (ref 7–25)
CO2: 27 mmol/L (ref 20–32)
Calcium: 9.3 mg/dL (ref 8.6–10.3)
Chloride: 109 mmol/L (ref 98–110)
Creat: 1.12 mg/dL (ref 0.60–1.26)
Globulin: 3.7 g/dL (calc) (ref 1.9–3.7)
Glucose, Bld: 92 mg/dL (ref 65–99)
Potassium: 3.9 mmol/L (ref 3.5–5.3)
Sodium: 141 mmol/L (ref 135–146)
Total Bilirubin: 0.4 mg/dL (ref 0.2–1.2)
Total Protein: 8 g/dL (ref 6.1–8.1)
eGFR: 87 mL/min/{1.73_m2} (ref >=60)

## 2022-07-16 LAB — RPR TITER: RPR Titer: 1:16 {titer} — ABNORMAL HIGH

## 2022-07-16 LAB — RPR: RPR Ser Ql: REACTIVE — AB

## 2022-07-16 LAB — HIV-1 RNA QUANT-NO REFLEX-BLD
HIV 1 RNA Quant: NOT DETECTED Copies/mL
HIV-1 RNA Quant, Log: NOT DETECTED Log cps/mL

## 2022-07-16 LAB — T PALLIDUM AB: T Pallidum Abs: POSITIVE — AB

## 2022-07-28 ENCOUNTER — Ambulatory Visit: Payer: Medicaid Other | Admitting: Internal Medicine

## 2022-08-27 ENCOUNTER — Other Ambulatory Visit: Payer: Self-pay | Admitting: Internal Medicine

## 2022-08-27 DIAGNOSIS — I10 Essential (primary) hypertension: Secondary | ICD-10-CM

## 2022-09-24 ENCOUNTER — Other Ambulatory Visit: Payer: Self-pay | Admitting: Internal Medicine

## 2022-09-24 DIAGNOSIS — I10 Essential (primary) hypertension: Secondary | ICD-10-CM

## 2022-09-24 NOTE — Telephone Encounter (Signed)
Please advise on refill.

## 2022-09-27 NOTE — Telephone Encounter (Signed)
Future refills to come from PCP per 01/27/22 office note.   Sandie Ano, RN

## 2022-10-04 ENCOUNTER — Other Ambulatory Visit: Payer: Medicaid Other

## 2022-10-04 ENCOUNTER — Other Ambulatory Visit: Payer: Self-pay

## 2022-10-04 DIAGNOSIS — Z113 Encounter for screening for infections with a predominantly sexual mode of transmission: Secondary | ICD-10-CM

## 2022-10-04 DIAGNOSIS — B20 Human immunodeficiency virus [HIV] disease: Secondary | ICD-10-CM

## 2022-10-07 ENCOUNTER — Other Ambulatory Visit (HOSPITAL_COMMUNITY)
Admission: RE | Admit: 2022-10-07 | Discharge: 2022-10-07 | Disposition: A | Discharge status: Home / Self Care | Payer: Medicaid Other | Source: Ambulatory Visit | Attending: Internal Medicine | Admitting: Internal Medicine

## 2022-10-07 ENCOUNTER — Other Ambulatory Visit: Payer: Self-pay

## 2022-10-07 ENCOUNTER — Other Ambulatory Visit: Payer: Medicaid Other

## 2022-10-07 DIAGNOSIS — Z113 Encounter for screening for infections with a predominantly sexual mode of transmission: Secondary | ICD-10-CM

## 2022-10-07 DIAGNOSIS — B20 Human immunodeficiency virus [HIV] disease: Secondary | ICD-10-CM

## 2022-10-07 LAB — CBC WITH DIFFERENTIAL/PLATELET
Absolute Monocytes: 426 cells/uL (ref 200–950)
Basophils Absolute: 29 cells/uL (ref 0–200)
Basophils Relative: 0.7 %
Eosinophils Absolute: 82 cells/uL (ref 15–500)
Eosinophils Relative: 2 %
HCT: 36.5 % — ABNORMAL LOW (ref 38.5–50.0)
Hemoglobin: 12.4 g/dL — ABNORMAL LOW (ref 13.2–17.1)
Lymphs Abs: 1398 cells/uL (ref 850–3900)
MCH: 29.5 pg (ref 27.0–33.0)
MCHC: 34 g/dL (ref 32.0–36.0)
MCV: 86.7 fL (ref 80.0–100.0)
MPV: 10.8 fL (ref 7.5–12.5)
Monocytes Relative: 10.4 %
Neutro Abs: 2165 cells/uL (ref 1500–7800)
Neutrophils Relative %: 52.8 %
Platelets: 250 10*3/uL (ref 140–400)
RBC: 4.21 10*6/uL (ref 4.20–5.80)
RDW: 13 % (ref 11.0–15.0)
Total Lymphocyte: 34.1 %
WBC: 4.1 10*3/uL (ref 3.8–10.8)

## 2022-10-08 LAB — URINE CYTOLOGY ANCILLARY ONLY: Chlamydia: NEGATIVE

## 2022-10-19 ENCOUNTER — Other Ambulatory Visit (HOSPITAL_COMMUNITY)
Admission: RE | Admit: 2022-10-19 | Discharge: 2022-10-19 | Disposition: A | Discharge status: Home / Self Care | Payer: Medicaid Other | Source: Ambulatory Visit | Attending: Internal Medicine | Admitting: Internal Medicine

## 2022-10-19 ENCOUNTER — Ambulatory Visit: Payer: Medicaid Other | Admitting: Internal Medicine

## 2022-10-19 ENCOUNTER — Ambulatory Visit (INDEPENDENT_AMBULATORY_CARE_PROVIDER_SITE_OTHER): Payer: Medicaid Other | Admitting: Internal Medicine

## 2022-10-19 ENCOUNTER — Encounter: Payer: Self-pay | Admitting: Internal Medicine

## 2022-10-19 ENCOUNTER — Other Ambulatory Visit: Payer: Self-pay

## 2022-10-19 VITALS — BP 137/93 | HR 64 | Temp 98.1°F | Ht 67.0 in | Wt 207.0 lb

## 2022-10-19 DIAGNOSIS — Z113 Encounter for screening for infections with a predominantly sexual mode of transmission: Secondary | ICD-10-CM

## 2022-10-19 DIAGNOSIS — I10 Essential (primary) hypertension: Secondary | ICD-10-CM | POA: Diagnosis not present

## 2022-10-19 DIAGNOSIS — Z8619 Personal history of other infectious and parasitic diseases: Secondary | ICD-10-CM | POA: Diagnosis not present

## 2022-10-19 DIAGNOSIS — Z79899 Other long term (current) drug therapy: Secondary | ICD-10-CM

## 2022-10-19 DIAGNOSIS — F17210 Nicotine dependence, cigarettes, uncomplicated: Secondary | ICD-10-CM

## 2022-10-19 DIAGNOSIS — F17200 Nicotine dependence, unspecified, uncomplicated: Secondary | ICD-10-CM

## 2022-10-19 DIAGNOSIS — K611 Rectal abscess: Secondary | ICD-10-CM | POA: Diagnosis not present

## 2022-10-19 DIAGNOSIS — B20 Human immunodeficiency virus [HIV] disease: Secondary | ICD-10-CM | POA: Diagnosis present

## 2022-10-19 NOTE — Assessment & Plan Note (Signed)
Stable titer

## 2022-10-19 NOTE — Assessment & Plan Note (Signed)
He is doing well on biktarvy and reviewed labs with him.  No changes and rtc in 6 months.

## 2022-10-19 NOTE — Progress Notes (Signed)
   Subjective:    Patient ID: Phillip Morales, male    DOB: [birth year], 37 y.o.   MRN: [id number]  HPI Phillip Morales is here for follow up of HIV He continues on Napanoch with no concerns.  No issues with getting or taking his medication.  He has no complaints today.  Uses condoms with sexual activity.  Going to the gym.  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     Review of Systems  Constitutional:  Negative for fatigue.  Gastrointestinal:  Negative for diarrhea and nausea.  Skin:  Negative for rash.       Objective:   Physical Exam Eyes:     General: No scleral icterus. Pulmonary:     Effort: Pulmonary effort is normal.  Neurological:     Mental Status: He is alert.   SH: some vaping still, trying to quit        Assessment & Plan:

## 2022-10-19 NOTE — Assessment & Plan Note (Signed)
BP stable on current regimen.  

## 2022-10-19 NOTE — Assessment & Plan Note (Signed)
Discussed cessation and need to quit completely.

## 2022-10-19 NOTE — Addendum Note (Signed)
Addended by: Philippa Chester on: 10/19/2022 11:39 AM   Modules accepted: Orders

## 2022-12-27 ENCOUNTER — Other Ambulatory Visit: Payer: Self-pay | Admitting: Internal Medicine

## 2022-12-27 DIAGNOSIS — I10 Essential (primary) hypertension: Secondary | ICD-10-CM

## 2023-01-31 ENCOUNTER — Other Ambulatory Visit: Payer: Self-pay | Admitting: Internal Medicine

## 2023-04-16 ENCOUNTER — Encounter (HOSPITAL_COMMUNITY): Payer: Self-pay

## 2023-04-16 ENCOUNTER — Other Ambulatory Visit: Payer: Self-pay

## 2023-04-16 ENCOUNTER — Emergency Department (HOSPITAL_COMMUNITY)
Admission: EM | Admit: 2023-04-16 | Discharge: 2023-04-16 | Disposition: A | Discharge status: Home / Self Care | Payer: Medicaid Other | Attending: Emergency Medicine | Admitting: Emergency Medicine

## 2023-04-16 DIAGNOSIS — Z21 Asymptomatic human immunodeficiency virus [HIV] infection status: Secondary | ICD-10-CM | POA: Diagnosis not present

## 2023-04-16 DIAGNOSIS — I10 Essential (primary) hypertension: Secondary | ICD-10-CM | POA: Diagnosis not present

## 2023-04-16 DIAGNOSIS — B028 Zoster with other complications: Secondary | ICD-10-CM | POA: Diagnosis not present

## 2023-04-16 DIAGNOSIS — R21 Rash and other nonspecific skin eruption: Secondary | ICD-10-CM | POA: Diagnosis present

## 2023-04-16 DIAGNOSIS — Z79899 Other long term (current) drug therapy: Secondary | ICD-10-CM | POA: Diagnosis not present

## 2023-04-16 DIAGNOSIS — B029 Zoster without complications: Secondary | ICD-10-CM

## 2023-04-16 MED ORDER — VALACYCLOVIR HCL 1 G PO TABS: 1000.0000 mg | ORAL_TABLET | Freq: Three times a day (TID) | ORAL | 0 refills | Status: DC

## 2023-04-16 MED ORDER — VALACYCLOVIR HCL 500 MG PO TABS
1000.0000 mg | ORAL_TABLET | Freq: Once | ORAL | Status: AC
  Administered 2023-04-16: 1000 mg via ORAL
  Filled 2023-04-16: qty 2

## 2023-04-16 NOTE — ED Provider Notes (Cosign Needed Addendum)
EMERGENCY DEPARTMENT AT [facility] Provider Note   CSN: [id number] Arrival date & time: 04/16/23  2024     History  Chief Complaint  Patient presents with   Herpes Zoster    Phillip Morales is a 38 y.o. male.  He has PMH of hypertension and HIV.  He is compliant with his HIV meds and follow-ups.  Comes with about 3 days of rash to right mid back and now on his right chest, states it is itchy and somewhat burning, denies fever or chills, no chest pain, no shortness of breath, no nausea or vomiting, no other complaints.  HPI     Home Medications Prior to Admission medications   Medication Sig Start Date End Date Taking? Authorizing Provider  valACYclovir (VALTREX) 1000 MG tablet Take 1 tablet (1,000 mg total) by mouth 3 (three) times daily. 04/16/23  Yes Lanaya Bennis A, PA-C  amLODipine (NORVASC) 5 MG tablet TAKE 1 TABLET(5 MG) BY MOUTH DAILY. NO FURTHER REFILLS UNTIL SEEN IN CLINIC 08/30/22   Comer, Belia Heman, MD  BIKTARVY 50-200-25 MG TABS tablet TAKE 1 TABLET BY MOUTH DAILY 01/31/23   Comer, Belia Heman, MD      Allergies    Peanut-containing drug products    Review of Systems   Review of Systems  Physical Exam Updated Vital Signs BP (S) (!) 162/105 Comment: pt reports he hasn't taken BP meds "in a while"  Pulse 64   Temp 98.1 F (36.7 C) (Oral)   Resp 18   Ht 5\' 7"  (1.702 m)   Wt 99.8 kg   SpO2 99%   BMI 34.46 kg/m  Physical Exam Vitals and nursing note reviewed.  Constitutional:      General: He is not in acute distress.    Appearance: He is well-developed.  HENT:     Head: Normocephalic and atraumatic.  Eyes:     Conjunctiva/sclera: Conjunctivae normal.  Cardiovascular:     Rate and Rhythm: Normal rate and regular rhythm.     Heart sounds: No murmur heard. Pulmonary:     Effort: Pulmonary effort is normal. No respiratory distress.     Breath sounds: Normal breath sounds.  Abdominal:     Palpations: Abdomen is soft.     Tenderness:  There is no abdominal tenderness.  Musculoskeletal:        General: No swelling.     Cervical back: Neck supple.  Skin:    General: Skin is warm and dry.     Capillary Refill: Capillary refill takes less than 2 seconds.     Comments: Vesicular rash on erythematous base in linear dermatomal pattern along the T7 dermatome, small area on the back and right anterior chest, does not cross midline.  Neurological:     General: No focal deficit present.     Mental Status: He is alert and oriented to person, place, and time.  Psychiatric:        Mood and Affect: Mood normal.     ED Results / Procedures / Treatments   Labs (all labs ordered are listed, but only abnormal results are displayed) Labs Reviewed - No data to display  EKG None  Radiology No results found.  Procedures Procedures    Medications Ordered in ED Medications  valACYclovir (VALTREX) tablet 1,000 mg (has no administration in time range)    ED Course/ Medical Decision Making/ A&P  Medical Decision Making Differential diagnosis includes but not limited to herpes zoster, contact dermatitis, cellulitis, other  Course: Patient with right sided dermatomal rash distribution of T7 dermatome.  No systemic symptoms.  Does have HIV but is compliant with all his medications.  Started sometime in the past 3 days will start him on Valtrex.  Discussed infection control, follow-up and return precautions as well as supportive care at home  Risk Prescription drug management.           Final Clinical Impression(s) / ED Diagnoses Final diagnoses:  Herpes zoster without complication    Rx / DC Orders ED Discharge Orders          Ordered    valACYclovir (VALTREX) 1000 MG tablet  3 times daily        04/16/23 2211              Ma Rings, PA-C 04/16/23 2214    Josem Kaufmann 04/16/23 2215    Eber Hong, MD 04/17/23 [phone]

## 2023-04-16 NOTE — ED Triage Notes (Signed)
Pt to ED from home with rash to right side of back that started 3 days ago- also noted to same side of chest, same dermatome, appears like shingles. No weeping or break in skin integrity.

## 2023-04-16 NOTE — Discharge Instructions (Signed)
You are seen in the ER today for a rash.  This is consistent with shingles.  As discussed you need to keep this covered when around others until it is completely crusted over.  You can take over-the-counter Tylenol or ibuprofen as directed on packaging for discomfort.  You can also try topical calamine lotion to help with itching and irritation topically.    Is important especially to avoid contact with people who are pregnant, have suppressed immune systems or are too young to have been vaccinated for chickenpox as contact with the rash could expose him and infect them.

## 2023-06-29 ENCOUNTER — Other Ambulatory Visit

## 2023-06-29 NOTE — Addendum Note (Signed)
 Addended by: Rawleigh Cadet T on: 06/29/2023 09:02 AM   Modules accepted: Orders

## 2023-06-30 ENCOUNTER — Other Ambulatory Visit: Payer: Self-pay

## 2023-06-30 ENCOUNTER — Other Ambulatory Visit

## 2023-06-30 DIAGNOSIS — Z113 Encounter for screening for infections with a predominantly sexual mode of transmission: Secondary | ICD-10-CM

## 2023-06-30 DIAGNOSIS — B20 Human immunodeficiency virus [HIV] disease: Secondary | ICD-10-CM

## 2023-06-30 DIAGNOSIS — Z79899 Other long term (current) drug therapy: Secondary | ICD-10-CM

## 2023-07-03 LAB — CBC WITH DIFFERENTIAL/PLATELET
Absolute Lymphocytes: 1194 {cells}/uL (ref 850–3900)
Absolute Monocytes: 484 {cells}/uL (ref 200–950)
Basophils Absolute: 22 {cells}/uL (ref 0–200)
Basophils Relative: 0.4 %
Eosinophils Absolute: 72 {cells}/uL (ref 15–500)
Eosinophils Relative: 1.3 %
HCT: 38 % — ABNORMAL LOW (ref 38.5–50.0)
Hemoglobin: 12.7 g/dL — ABNORMAL LOW (ref 13.2–17.1)
MCH: 30 pg (ref 27.0–33.0)
MCHC: 33.4 g/dL (ref 32.0–36.0)
MCV: 89.6 fL (ref 80.0–100.0)
MPV: 10.8 fL (ref 7.5–12.5)
Monocytes Relative: 8.8 %
Neutro Abs: 3729 {cells}/uL (ref 1500–7800)
Neutrophils Relative %: 67.8 %
Platelets: 219 10*3/uL (ref 140–400)
RBC: 4.24 10*6/uL (ref 4.20–5.80)
RDW: 12.8 % (ref 11.0–15.0)
Total Lymphocyte: 21.7 %
WBC: 5.5 10*3/uL (ref 3.8–10.8)

## 2023-07-03 LAB — COMPLETE METABOLIC PANEL WITHOUT GFR
AG Ratio: 1.3 (calc) (ref 1.0–2.5)
ALT: 10 U/L (ref 9–46)
AST: 15 U/L (ref 10–40)
Albumin: 4.3 g/dL (ref 3.6–5.1)
Alkaline phosphatase (APISO): 61 U/L (ref 36–130)
BUN: 15 mg/dL (ref 7–25)
CO2: 26 mmol/L (ref 20–32)
Calcium: 9.3 mg/dL (ref 8.6–10.3)
Chloride: 106 mmol/L (ref 98–110)
Creat: 1.17 mg/dL (ref 0.60–1.26)
Globulin: 3.3 g/dL (ref 1.9–3.7)
Glucose, Bld: 135 mg/dL — ABNORMAL HIGH (ref 65–99)
Potassium: 3.6 mmol/L (ref 3.5–5.3)
Sodium: 139 mmol/L (ref 135–146)
Total Bilirubin: 0.4 mg/dL (ref 0.2–1.2)
Total Protein: 7.6 g/dL (ref 6.1–8.1)

## 2023-07-03 LAB — T-HELPER CELLS (CD4) COUNT (NOT AT ARMC)
Absolute CD4: 378 {cells}/uL — ABNORMAL LOW (ref 490–1740)
CD4 T Helper %: 30 % (ref 30–61)
Total lymphocyte count: 1251 {cells}/uL (ref 850–3900)

## 2023-07-03 LAB — T PALLIDUM AB: T Pallidum Abs: POSITIVE — AB

## 2023-07-03 LAB — HIV-1 RNA QUANT-NO REFLEX-BLD
HIV 1 RNA Quant: <20 {copies}/mL — AB
HIV-1 RNA Quant, Log: <1.3 {Log_copies}/mL — AB

## 2023-07-03 LAB — LIPID PANEL
Cholesterol: 170 mg/dL (ref <200)
HDL: 25 mg/dL — ABNORMAL LOW (ref >=40)
Non-HDL Cholesterol (Calc): 145 mg/dL — ABNORMAL HIGH (ref <130)
Total CHOL/HDL Ratio: 6.8 (calc) — ABNORMAL HIGH (ref <5.0)
Triglycerides: 403 mg/dL — ABNORMAL HIGH (ref <150)

## 2023-07-03 LAB — RPR: RPR Ser Ql: REACTIVE — AB

## 2023-07-03 LAB — RPR TITER: RPR Titer: 1:8 {titer} — ABNORMAL HIGH

## 2023-07-13 ENCOUNTER — Other Ambulatory Visit (HOSPITAL_COMMUNITY)
Admission: RE | Admit: 2023-07-13 | Discharge: 2023-07-13 | Disposition: A | Discharge status: Home / Self Care | Source: Ambulatory Visit | Attending: Internal Medicine | Admitting: Internal Medicine

## 2023-07-13 ENCOUNTER — Encounter: Payer: Self-pay | Admitting: Internal Medicine

## 2023-07-13 ENCOUNTER — Other Ambulatory Visit: Payer: Self-pay

## 2023-07-13 ENCOUNTER — Ambulatory Visit: Payer: Self-pay | Admitting: Internal Medicine

## 2023-07-13 VITALS — BP 176/122 | HR 71 | Temp 97.8°F | Ht 67.0 in | Wt 208.0 lb

## 2023-07-13 DIAGNOSIS — B20 Human immunodeficiency virus [HIV] disease: Secondary | ICD-10-CM | POA: Insufficient documentation

## 2023-07-13 DIAGNOSIS — Z23 Encounter for immunization: Secondary | ICD-10-CM | POA: Diagnosis not present

## 2023-07-13 MED ORDER — BIKTARVY 50-200-25 MG PO TABS: 1.0000 | ORAL_TABLET | Freq: Every day | ORAL | 11 refills | Status: DC

## 2023-07-13 NOTE — Progress Notes (Signed)
 [facility]     HPI: Phillip Morales is a 38 y.o. male presents for HIV management. oN biktarvy . Rarely misses doses.    Date of diagnosis 2014 ART exposure prezista + retonavir->truvada + prezista ->biktarvy  Past OIs Risk factors: MSM Partners in last 2months 2 Anal sex verse. Contraception most of the time   Social: Occupation:  Education officer, environmental,  Housing: Insurance claims handler, apt by himself Support: father Understanding of HIV: good Etoh/drug/tobacco use: Socail/mj/occaisional Past Medical History:  Diagnosis Date   Asthma    HIV (human immunodeficiency virus infection) (HCC)    Hypertension     No past surgical history on file.  Family History  Problem Relation Age of Onset   Kidney disease Neg Hx    Current Outpatient Medications on File Prior to Visit  Medication Sig Dispense Refill   amLODipine  (NORVASC ) 5 MG tablet TAKE 1 TABLET(5 MG) BY MOUTH DAILY. NO FURTHER REFILLS UNTIL SEEN IN CLINIC 30 tablet 0   BIKTARVY  50-200-25 MG TABS tablet TAKE 1 TABLET BY MOUTH DAILY 30 tablet 11   valACYclovir  (VALTREX ) 1000 MG tablet Take 1 tablet (1,000 mg total) by mouth 3 (three) times daily. 21 tablet 0   No current facility-administered medications on file prior to visit.    Allergies  Allergen Reactions   Peanut-Containing Drug Products Anaphylaxis      Lab Results HIV 1 RNA Quant  Date Value  06/30/2023 <20 DETECTED copies/mL (A)  10/07/2022 Not Detected Copies/mL  07/14/2022 Not Detected Copies/mL   CD4 T Cell Abs (/uL)  Date Value  10/07/2022 329 (L)  07/14/2022 298 (L)  01/21/2022 227 (L)   No results found for: "HIV1GENOSEQ" Lab Results  Component Value Date   WBC 5.5 06/30/2023   HGB 12.7 (L) 06/30/2023   HCT 38.0 (L) 06/30/2023   MCV 89.6 06/30/2023   PLT 219 06/30/2023    Lab Results  Component Value Date   CREATININE 1.17 06/30/2023   BUN 15 06/30/2023   NA 139 06/30/2023   K 3.6 06/30/2023   CL 106 06/30/2023   CO2 26  06/30/2023   Lab Results  Component Value Date   ALT 10 06/30/2023   AST 15 06/30/2023   ALKPHOS 56 05/31/2021   BILITOT 0.4 06/30/2023    Lab Results  Component Value Date   CHOL 170 06/30/2023   TRIG 403 (H) 06/30/2023   HDL 25 (L) 06/30/2023   LDLCALC  06/30/2023     Comment:     . LDL cholesterol not calculated. Triglyceride levels greater than 400 mg/dL invalidate calculated LDL results. . Reference range: <100 . Desirable range <100 mg/dL for primary prevention;   <70 mg/dL for patients with CHD or diabetic patients  with > or = 2 CHD risk factors. Aaron Aas LDL-C is now calculated using the Martin-Hopkins  calculation, which is a validated novel method providing  better accuracy than the Friedewald equation in the  estimation of LDL-C.  Melinda Sprawls et al. Erroll Heard. 4098;119(14): 2061-2068  (http://education.[employer].com/faq/FAQ164)    Lab Results  Component Value Date   HAV NON REACTIVE 02/13/2013   Lab Results  Component Value Date   HEPBSAG NEGATIVE 02/13/2013   HEPBSAB POS (A) 02/13/2013   Lab Results  Component Value Date   HCVAB NEGATIVE 02/13/2013   Lab Results  Component Value Date   CHLAMYDIAWP Negative 10/19/2022   N Negative 10/19/2022   No results found for: "GCPROBEAPT" No results found for: "QUANTGOLD"  Assessment/Plan #HIV -  CD4 378, VL nd, on 06/30/23 -continue biktarcy -F/U 6 months   #Vaccination COVID Flu Monkeypox PCV 23 09/27/2018 Meningitis' utd HPV 20223 HepA serology today HEpB immune Tdap dose today Shingles  #Health maintenance -Quantiferon NV -RPR 1:16 on 10/07/22->1:8 06/30/23 (initial 1:64 on 01/21/22->sp pen x 1 dose -HCV tNV -GC today-> 3 point -Lipid -Dysplasia screen M 10/2022 Age dependant -Colonoscopy    Orlie Bjornstad, MD [facility] I have personally spent 45 minutes involved in face-to-face and non-face-to-face activities for this patient on the  day of the visit. Professional time spent includes the following activities: Preparing to see the patient (review of tests), Obtaining and/or reviewing separately obtained history (admission/discharge record), Performing a medically appropriate examination and/or evaluation , Ordering medications/tests/procedures, referring and communicating with other health care professionals, Documenting clinical information in the EMR, Independently interpreting results (not separately reported), Communicating results to the patient/family/caregiver, Counseling and educating the patient/family/caregiver and Care coordination (not separately reported).

## 2023-07-14 LAB — URINE CYTOLOGY ANCILLARY ONLY
Chlamydia: NEGATIVE
Comment: NEGATIVE
Comment: NORMAL
Neisseria Gonorrhea: NEGATIVE

## 2023-07-14 LAB — CYTOLOGY, (ORAL, ANAL, URETHRAL) ANCILLARY ONLY
Chlamydia: NEGATIVE
Comment: NEGATIVE
Comment: NORMAL
Neisseria Gonorrhea: NEGATIVE

## 2023-07-15 LAB — CYTOLOGY, (ORAL, ANAL, URETHRAL) ANCILLARY ONLY
Chlamydia: NEGATIVE
Comment: NEGATIVE
Comment: NORMAL
Neisseria Gonorrhea: NEGATIVE

## 2023-08-15 ENCOUNTER — Emergency Department (HOSPITAL_COMMUNITY): Payer: Self-pay

## 2023-08-15 ENCOUNTER — Other Ambulatory Visit: Payer: Self-pay

## 2023-08-15 ENCOUNTER — Encounter (HOSPITAL_COMMUNITY): Payer: Self-pay | Admitting: *Deleted

## 2023-08-15 ENCOUNTER — Emergency Department (HOSPITAL_COMMUNITY)
Admission: EM | Admit: 2023-08-15 | Discharge: 2023-08-15 | Disposition: A | Discharge status: Home / Self Care | Payer: Self-pay | Attending: Emergency Medicine | Admitting: Emergency Medicine

## 2023-08-15 DIAGNOSIS — Z91010 Allergy to peanuts: Secondary | ICD-10-CM | POA: Insufficient documentation

## 2023-08-15 DIAGNOSIS — Z79899 Other long term (current) drug therapy: Secondary | ICD-10-CM | POA: Insufficient documentation

## 2023-08-15 DIAGNOSIS — R0789 Other chest pain: Secondary | ICD-10-CM | POA: Insufficient documentation

## 2023-08-15 DIAGNOSIS — I159 Secondary hypertension, unspecified: Secondary | ICD-10-CM | POA: Insufficient documentation

## 2023-08-15 DIAGNOSIS — D72819 Decreased white blood cell count, unspecified: Secondary | ICD-10-CM | POA: Insufficient documentation

## 2023-08-15 DIAGNOSIS — K604 Rectal fistula, unspecified: Secondary | ICD-10-CM | POA: Insufficient documentation

## 2023-08-15 LAB — HEPATIC FUNCTION PANEL
ALT: 23 U/L (ref 0–44)
AST: 33 U/L (ref 15–41)
Albumin: 4.1 g/dL (ref 3.5–5.0)
Alkaline Phosphatase: 54 U/L (ref 38–126)
Bilirubin, Direct: 0.1 mg/dL (ref 0.0–0.2)
Indirect Bilirubin: 0.4 mg/dL (ref 0.3–0.9)
Total Bilirubin: 0.5 mg/dL (ref 0.0–1.2)
Total Protein: 8.3 g/dL — ABNORMAL HIGH (ref 6.5–8.1)

## 2023-08-15 LAB — BASIC METABOLIC PANEL WITH GFR
Anion gap: 9 (ref 5–15)
BUN: 13 mg/dL (ref 6–20)
CO2: 27 mmol/L (ref 22–32)
Calcium: 9 mg/dL (ref 8.9–10.3)
Chloride: 104 mmol/L (ref 98–111)
Creatinine, Ser: 1.32 mg/dL — ABNORMAL HIGH (ref 0.61–1.24)
GFR, Estimated: >60 mL/min (ref >60)
Glucose, Bld: 66 mg/dL — ABNORMAL LOW (ref 70–99)
Potassium: 4 mmol/L (ref 3.5–5.1)
Sodium: 140 mmol/L (ref 135–145)

## 2023-08-15 LAB — CBC
HCT: 37.8 % — ABNORMAL LOW (ref 39.0–52.0)
Hemoglobin: 13.3 g/dL (ref 13.0–17.0)
MCH: 30.1 pg (ref 26.0–34.0)
MCHC: 35.2 g/dL (ref 30.0–36.0)
MCV: 85.5 fL (ref 80.0–100.0)
Platelets: 218 10*3/uL (ref 150–400)
RBC: 4.42 MIL/uL (ref 4.22–5.81)
RDW: 13.6 % (ref 11.5–15.5)
WBC: 3.9 10*3/uL — ABNORMAL LOW (ref 4.0–10.5)
nRBC: 0 % (ref 0.0–0.2)

## 2023-08-15 LAB — LIPASE, BLOOD: Lipase: 32 U/L (ref 11–51)

## 2023-08-15 LAB — CBG MONITORING, ED: Glucose-Capillary: 84 mg/dL (ref 70–99)

## 2023-08-15 LAB — TROPONIN I (HIGH SENSITIVITY)
Troponin I (High Sensitivity): 8 ng/L (ref <18)
Troponin I (High Sensitivity): 7 ng/L (ref <18)

## 2023-08-15 MED ORDER — PANTOPRAZOLE SODIUM 20 MG PO TBEC: 20.0000 mg | DELAYED_RELEASE_TABLET | Freq: Every day | ORAL | 0 refills | Status: DC

## 2023-08-15 MED ORDER — IOHEXOL 350 MG/ML SOLN
100.0000 mL | Freq: Once | INTRAVENOUS | Status: AC | PRN
  Administered 2023-08-15: 100 mL via INTRAVENOUS

## 2023-08-15 MED ORDER — AMLODIPINE BESYLATE 5 MG PO TABS: 5.0000 mg | ORAL_TABLET | Freq: Every day | ORAL | 1 refills | Status: DC

## 2023-08-15 NOTE — ED Provider Notes (Signed)
 [place] EMERGENCY DEPARTMENT AT [facility] Provider Note   CSN: [id number] Arrival date & time: 08/15/23  1412     History  Chief Complaint  Patient presents with   Chest Pain    Phillip Morales is a 38 y.o. male.  Patient is a 38 year old male who presents emergency department the chief complaint of 4 days of left-sided chest pain.  He notes that the pain has been intermittent in nature.  He notes that nothing particular seems to make the pain better or worse.  He denies any associated shortness of breath.  He has had no associated dizziness, lightheadedness or syncope.  He denies any recent cold-like symptoms.  The pain is not exertional or positional in nature.  He denies any personal or family history of cardiac or pulmonary disease.  He denies any associated fever, chills, nausea, vomiting, diarrhea.  He does note that the pain intermittently radiates into his abdomen.   Chest Pain      Home Medications Prior to Admission medications   Medication Sig Start Date End Date Taking? Authorizing Provider  amLODipine  (NORVASC ) 5 MG tablet TAKE 1 TABLET(5 MG) BY MOUTH DAILY. NO FURTHER REFILLS UNTIL SEEN IN CLINIC 08/30/22   Comer, Judithann Novas, MD  bictegravir-emtricitabine -tenofovir  AF (BIKTARVY ) 50-200-25 MG TABS tablet Take 1 tablet by mouth daily. 07/13/23   Orlie Bjornstad, MD  valACYclovir  (VALTREX ) 1000 MG tablet Take 1 tablet (1,000 mg total) by mouth 3 (three) times daily. 04/16/23   Baxter Limber A, PA-C      Allergies    Peanut-containing drug products    Review of Systems   Review of Systems  Cardiovascular:  Positive for chest pain.  All other systems reviewed and are negative.   Physical Exam Updated Vital Signs BP (!) 175/115 (BP Location: Right Arm)   Pulse 68   Temp 98.2 F (36.8 C) (Oral)   Resp 17   Ht 5\' 7"  (1.702 m)   Wt 94.8 kg   SpO2 97%   BMI 32.73 kg/m  Physical Exam Vitals and nursing note reviewed.  Constitutional:      Appearance:  Normal appearance.  HENT:     Head: Normocephalic and atraumatic.     Nose: Nose normal.     Mouth/Throat:     Mouth: Mucous membranes are moist.  Eyes:     Extraocular Movements: Extraocular movements intact.     Conjunctiva/sclera: Conjunctivae normal.     Pupils: Pupils are equal, round, and reactive to light.  Cardiovascular:     Rate and Rhythm: Normal rate and regular rhythm.     Pulses: Normal pulses.     Heart sounds: Normal heart sounds. Heart sounds not distant. No murmur heard.    No systolic murmur is present.     No diastolic murmur is present.  Pulmonary:     Effort: Pulmonary effort is normal. No tachypnea.     Breath sounds: Normal breath sounds. No stridor. No decreased breath sounds, wheezing, rhonchi or rales.  Chest:     Chest wall: No tenderness.  Abdominal:     General: Abdomen is flat. Bowel sounds are normal.     Palpations: Abdomen is soft. There is no mass.     Tenderness: There is no abdominal tenderness. There is no guarding.  Musculoskeletal:        General: Normal range of motion.     Cervical back: Normal range of motion and neck supple.     Right lower  leg: No edema.     Left lower leg: No edema.     Comments: Clubbing of the digits in the fingers noted  Skin:    General: Skin is warm and dry.     Findings: No erythema or rash.  Neurological:     General: No focal deficit present.     Mental Status: He is alert and oriented to person, place, and time. Mental status is at baseline.  Psychiatric:        Mood and Affect: Mood normal.        Behavior: Behavior normal.        Thought Content: Thought content normal.        Judgment: Judgment normal.     ED Results / Procedures / Treatments   Labs (all labs ordered are listed, but only abnormal results are displayed) Labs Reviewed  BASIC METABOLIC PANEL WITH GFR - Abnormal; Notable for the following components:      Result Value   Glucose, Bld 66 (*)    Creatinine, Ser 1.32 (*)    All  other components within normal limits  CBC - Abnormal; Notable for the following components:   WBC 3.9 (*)    HCT 37.8 (*)    All other components within normal limits  LIPASE, BLOOD  HEPATIC FUNCTION PANEL  CBG MONITORING, ED  TROPONIN I (HIGH SENSITIVITY)  TROPONIN I (HIGH SENSITIVITY)    EKG None  Radiology DG Chest 2 View Result Date: 08/15/2023 CLINICAL DATA:  Chest pain for 4 days. EXAM: CHEST - 2 VIEW COMPARISON:  11/29/2016 FINDINGS: The cardiomediastinal contours are normal. Mild bronchial thickening. Pulmonary vasculature is normal. No consolidation, pleural effusion, or pneumothorax. No acute osseous abnormalities are seen. IMPRESSION: Mild bronchial thickening. Electronically Signed   By: Chadwick Colonel M.D.   On: 08/15/2023 16:14    Procedures Procedures    Medications Ordered in ED Medications - No data to display  ED Course/ Medical Decision Making/ A&P Clinical Course as of 08/15/23 2159  Mon Aug 15, 2023  1916 CT Angio Chest/Abd/Pel for Dissection W and/or Wo Contrast [CR]    Clinical Course User Index [CR] Roselynn Connors, PA-C                                 Medical Decision Making Amount and/or Complexity of Data Reviewed Labs: ordered. Radiology: ordered. Decision-making details documented in ED Course.  Risk Prescription drug management.   This patient presents to the ED for concern of chest pain differential diagnosis includes acute ACS, pulmonary embolus, pericarditis, myocarditis, endocarditis, sepsis, pneumonia, pneumothorax, hemothorax    Additional history obtained:  Additional history obtained from none External records from outside source obtained and reviewed including medical records   Lab Tests:  I Ordered, and personally interpreted labs.  The pertinent results include: Leukopenia, no anemia, mild elevation of creatinine but at baseline, normal electrolytes, normal liver function, negative serial troponins, negative  lipase   Imaging Studies ordered:  I ordered imaging studies including CTA, chest abdomen and pelvis I independently visualized and interpreted imaging which showed no aortic aneurysm or dissection, no pulmonary embolus, no acute surgical process, perianal fistula noted I agree with the radiologist interpretation   Medicines ordered and prescription drug management:  I ordered medication including amlodipine , Protonix for GERD, hypertension Reevaluation of the patient after these medicines showed that the patient improved I have reviewed the patients home medicines  and have made adjustments as needed   Problem List / ED Course:  Patient is doing well at this time and is stable for discharge home.  Discussed with patient that we will place him back on his blood pressure medications as he has been off of them.  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  Area was evaluated by myself with chaperone demonstrating no signs of erythema, fluctuance or discharge.  Patient will continue to monitor this and return for any worsening symptoms.  Do not suspect antibiotics warranted at this time as this is a chronic ongoing issue.  Serial troponins have been negative and EKG demonstrated no acute ischemic changes.  Do not suspect ACS at this time.  He has no indication for aortic aneurysm or dissection.  Patient is chest pain-free at this time.  Ambulatory referral was placed and resources for primary care doctor were provided.  Strict turn precautions were discussed for any new or worsening symptoms.  Patient voiced understanding and had no additional questions.   Social Determinants of Health:  None           Final Clinical Impression(s) / ED Diagnoses Final diagnoses:  None    Rx /  DC Orders ED Discharge Orders     None         Emmalene Hare 08/15/23 2206    Ninetta Basket, MD 08/19/23 [phone]

## 2023-08-15 NOTE — ED Notes (Addendum)
 Clubbing noted to bilateral nailbeds

## 2023-08-15 NOTE — ED Notes (Signed)
 PA_C made aware of the lab value glucose of 66. Just check pts cbg and it is 84 at this time

## 2023-08-15 NOTE — ED Notes (Signed)
 Patient transported to CT

## 2023-08-15 NOTE — ED Triage Notes (Signed)
 Pt with CP x 3-4 days, aching per pt.  Pt states mostly on left but across as well. Denies any cough.

## 2023-08-15 NOTE — Discharge Instructions (Addendum)
 Please follow-up closely with cardiology on an outpatient basis.  Please continue to follow-up with your general surgeon regarding the fistula.  Return to emergency department immediately for any new or worsening symptoms.  Please take your blood pressure medications.

## 2023-08-25 ENCOUNTER — Ambulatory Visit

## 2023-10-03 ENCOUNTER — Other Ambulatory Visit: Payer: Self-pay

## 2023-10-03 DIAGNOSIS — B20 Human immunodeficiency virus [HIV] disease: Secondary | ICD-10-CM

## 2023-10-03 DIAGNOSIS — Z113 Encounter for screening for infections with a predominantly sexual mode of transmission: Secondary | ICD-10-CM

## 2023-10-06 ENCOUNTER — Other Ambulatory Visit: Payer: Self-pay

## 2023-10-06 ENCOUNTER — Ambulatory Visit: Payer: Self-pay

## 2023-10-06 DIAGNOSIS — B20 Human immunodeficiency virus [HIV] disease: Secondary | ICD-10-CM

## 2023-10-06 DIAGNOSIS — Z113 Encounter for screening for infections with a predominantly sexual mode of transmission: Secondary | ICD-10-CM

## 2023-10-07 LAB — T-HELPER CELL (CD4) - (RCID CLINIC ONLY)
CD4 % Helper T Cell: 30 % — ABNORMAL LOW (ref 33–65)
CD4 T Cell Abs: 344 /uL — ABNORMAL LOW (ref 400–1790)

## 2023-10-07 LAB — URINE CYTOLOGY ANCILLARY ONLY
Chlamydia: NEGATIVE
Comment: NEGATIVE
Comment: NORMAL
Neisseria Gonorrhea: NEGATIVE

## 2023-10-08 LAB — HIV-1 RNA QUANT-NO REFLEX-BLD
HIV 1 RNA Quant: NOT DETECTED {copies}/mL
HIV-1 RNA Quant, Log: NOT DETECTED {Log_copies}/mL

## 2023-10-08 LAB — RPR: RPR Ser Ql: NONREACTIVE

## 2023-11-07 ENCOUNTER — Ambulatory Visit: Payer: Self-pay | Attending: Internal Medicine | Admitting: Internal Medicine

## 2023-11-07 NOTE — Progress Notes (Signed)
 Erroneous encounter - please disregard.

## 2023-11-10 ENCOUNTER — Telehealth: Payer: Self-pay

## 2023-11-10 NOTE — Telephone Encounter (Signed)
 I returned patient's call about getting his medication. I asked him has  he received a letter from [facility] that I need that before I can complete his paperwork. He said he has not had time to contact them to follow up but would do that today.

## 2023-11-17 ENCOUNTER — Other Ambulatory Visit (HOSPITAL_COMMUNITY): Payer: Self-pay

## 2023-11-17 ENCOUNTER — Emergency Department (HOSPITAL_COMMUNITY)
Admission: EM | Admit: 2023-11-17 | Discharge: 2023-11-17 | Disposition: A | Discharge status: Home / Self Care | Payer: Self-pay | Attending: Emergency Medicine | Admitting: Emergency Medicine

## 2023-11-17 ENCOUNTER — Emergency Department (HOSPITAL_COMMUNITY): Payer: Self-pay

## 2023-11-17 ENCOUNTER — Encounter (HOSPITAL_COMMUNITY): Payer: Self-pay | Admitting: Emergency Medicine

## 2023-11-17 ENCOUNTER — Other Ambulatory Visit: Payer: Self-pay

## 2023-11-17 ENCOUNTER — Telehealth: Payer: Self-pay

## 2023-11-17 DIAGNOSIS — I10 Essential (primary) hypertension: Secondary | ICD-10-CM | POA: Insufficient documentation

## 2023-11-17 DIAGNOSIS — Z21 Asymptomatic human immunodeficiency virus [HIV] infection status: Secondary | ICD-10-CM | POA: Insufficient documentation

## 2023-11-17 DIAGNOSIS — I160 Hypertensive urgency: Secondary | ICD-10-CM | POA: Insufficient documentation

## 2023-11-17 DIAGNOSIS — Z91010 Allergy to peanuts: Secondary | ICD-10-CM | POA: Insufficient documentation

## 2023-11-17 DIAGNOSIS — Z79899 Other long term (current) drug therapy: Secondary | ICD-10-CM | POA: Insufficient documentation

## 2023-11-17 LAB — URINALYSIS, ROUTINE W REFLEX MICROSCOPIC
Bacteria, UA: NONE SEEN
Bilirubin Urine: NEGATIVE
Glucose, UA: NEGATIVE mg/dL
Ketones, ur: NEGATIVE mg/dL
Leukocytes,Ua: NEGATIVE
Nitrite: NEGATIVE
Protein, ur: NEGATIVE mg/dL
Specific Gravity, Urine: 1.012 (ref 1.005–1.030)
pH: 7 (ref 5.0–8.0)

## 2023-11-17 LAB — COMPREHENSIVE METABOLIC PANEL WITH GFR
ALT: 22 U/L (ref 0–44)
AST: 26 U/L (ref 15–41)
Albumin: 4 g/dL (ref 3.5–5.0)
Alkaline Phosphatase: 56 U/L (ref 38–126)
Anion gap: 11 (ref 5–15)
BUN: 15 mg/dL (ref 6–20)
CO2: 24 mmol/L (ref 22–32)
Calcium: 9 mg/dL (ref 8.9–10.3)
Chloride: 105 mmol/L (ref 98–111)
Creatinine, Ser: 1.23 mg/dL (ref 0.61–1.24)
GFR, Estimated: >60 mL/min (ref >60)
Glucose, Bld: 99 mg/dL (ref 70–99)
Potassium: 4 mmol/L (ref 3.5–5.1)
Sodium: 140 mmol/L (ref 135–145)
Total Bilirubin: 0.8 mg/dL (ref 0.0–1.2)
Total Protein: 8 g/dL (ref 6.5–8.1)

## 2023-11-17 LAB — CBC
HCT: 35.9 % — ABNORMAL LOW (ref 39.0–52.0)
Hemoglobin: 12.8 g/dL — ABNORMAL LOW (ref 13.0–17.0)
MCH: 29.8 pg (ref 26.0–34.0)
MCHC: 35.7 g/dL (ref 30.0–36.0)
MCV: 83.5 fL (ref 80.0–100.0)
Platelets: 195 K/uL (ref 150–400)
RBC: 4.3 MIL/uL (ref 4.22–5.81)
RDW: 13.3 % (ref 11.5–15.5)
WBC: 3.8 K/uL — ABNORMAL LOW (ref 4.0–10.5)
nRBC: 0 % (ref 0.0–0.2)

## 2023-11-17 LAB — CBG MONITORING, ED: Glucose-Capillary: 89 mg/dL (ref 70–99)

## 2023-11-17 MED ORDER — AMLODIPINE BESYLATE 5 MG PO TABS
5.0000 mg | ORAL_TABLET | Freq: Once | ORAL | Status: AC
  Administered 2023-11-17: 5 mg via ORAL
  Filled 2023-11-17: qty 1

## 2023-11-17 NOTE — Discharge Instructions (Addendum)
 https://www.biktarvyhcp.com/support/cost-assistance?utm_source=google&utm_medium=cpc&utm_campaign=us_sem_bith_hiv_hcp_ph_hiv_go_na_na_b_standard_branded_1432rn&utm_content=412-200-8389%[id number]%3Bkwd-[id number]&utm_term=help+paying+for+Biktarvy &gclsrc=aw.ds&gad_source=1&gad_campaignid=412-200-8389&gclid=[id number]  Please check this website out regarding your Biktarvy  medication needs.

## 2023-11-17 NOTE — ED Triage Notes (Signed)
 Pt c/o dizziness with sweating for a couple minutes, ruq/side pain intermittent and gen weakness x 3 days. All being intermittent. Home covid test neg. Pt has been out of meds x 1 week. Bp in triage185/120. Pt a/o. Non diaphoretic. Ambulated to room without difficulty.

## 2023-11-17 NOTE — Telephone Encounter (Signed)
 Tried to return patient's call and number did not have voicemail said to try again later. Patient needs to contact Social Services in [place]. Social Services advised me that he has not contacted them with the additional information they need. They believe he is employed.

## 2023-11-17 NOTE — ED Provider Notes (Signed)
 [place] EMERGENCY DEPARTMENT AT [facility] Provider Note   CSN: [id number] Arrival date & time: 11/17/23  9168     Patient presents with: Weakness, Abdominal Pain, and Dizziness   Phillip Morales is a 38 y.o. male.   HPI Patient with history of hypertension, HIV presents with concern for episodic lightheadedness, flushed sensation, nausea.  Symptoms have been intermittent, more frequent in occurrence over the past 3 days.  He notes that he ran out of his medication sometime ago has been unable to afford it.  No current chest pain, dyspnea or other physical complaints.    Prior to Admission medications   Medication Sig Start Date End Date Taking? Authorizing Provider  bictegravir-emtricitabine -tenofovir  AF (BIKTARVY ) 50-200-25 MG TABS tablet Take 1 tablet by mouth daily. 07/13/23  Yes Dennise Kingsley, MD  amLODipine  (NORVASC ) 5 MG tablet Take 1 tablet (5 mg total) by mouth daily. Patient not taking: Reported on 11/17/2023 08/15/23   Daralene Lonni BIRCH, PA-C    Allergies: Peanut-containing drug products    Review of Systems  Updated Vital Signs BP (!) 169/108   Pulse 60   Temp 98.6 F (37 C) (Oral)   Resp 19   SpO2 96%   Physical Exam Vitals and nursing note reviewed.  Constitutional:      General: He is not in acute distress.    Appearance: He is well-developed.  HENT:     Head: Normocephalic and atraumatic.  Eyes:     Conjunctiva/sclera: Conjunctivae normal.  Cardiovascular:     Rate and Rhythm: Normal rate and regular rhythm.  Pulmonary:     Effort: Pulmonary effort is normal. No respiratory distress.     Breath sounds: No stridor.  Abdominal:     General: There is no distension.  Skin:    General: Skin is warm and dry.  Neurological:     Mental Status: He is alert and oriented to person, place, and time.     (all labs ordered are listed, but only abnormal results are displayed) Labs Reviewed  CBC - Abnormal; Notable for the following components:       Result Value   WBC 3.8 (*)    Hemoglobin 12.8 (*)    HCT 35.9 (*)    All other components within normal limits  URINALYSIS, ROUTINE W REFLEX MICROSCOPIC - Abnormal; Notable for the following components:   Hgb urine dipstick MODERATE (*)    All other components within normal limits  COMPREHENSIVE METABOLIC PANEL WITH GFR  CBG MONITORING, ED    EKG: None  Radiology: DG Chest 2 View Result Date: 11/17/2023 CLINICAL DATA:  Intermittent dizziness and diaphoresis for 2 minutes. Intermittent right upper quadrant and right abdominal pain and generalized weakness for the past 3 days. Smoker. EXAM: CHEST - 2 VIEW COMPARISON:  08/15/2023 FINDINGS: Normal-sized heart. Clear lungs with normal vascularity. Stable mild peribronchial thickening and mild hyperexpansion of the lungs. Minimal thoracic spine degenerative changes. IMPRESSION: No acute abnormality. Stable mild changes of COPD and chronic bronchitis. Electronically Signed   By: Elspeth Bathe M.D.   On: 11/17/2023 09:52     Procedures   Medications Ordered in the ED  amLODipine  (NORVASC ) tablet 5 mg (5 mg Oral Given 11/17/23 1032)                                    Medical Decision Making Adult male presents hypertensive, concern for hypertensive  urgency versus emergency endorgan damage. With HIV, medication noncompliance consideration of progressive infection as well although the patient is afebrile, without evidence for bacteremia, sepsis and without current physical complaints. Labs sent monitoring commenced. Case discussed with social work for assistance with medications.   Amount and/or Complexity of Data Reviewed External Data Reviewed: notes. Labs: ordered. Decision-making details documented in ED Course. Radiology: ordered. ECG/medicine tests: ordered and independent interpretation performed. Decision-making details documented in ED Course.  Risk Prescription drug management. Decision regarding  hospitalization. Diagnosis or treatment significantly limited by social determinants of health.   11:14 AM Patient in no distress, awake, alert, labs reassuring, patient chronically has some degree of hematuria. Patient has received assistance with social work, is appropriate for discharge as there is no evidence for hypertensive crisis, or endorgan damage.      Final diagnoses:  Hypertensive urgency    ED Discharge Orders     None          Garrick Charleston, MD 11/17/23 1114

## 2023-11-17 NOTE — ED Notes (Signed)
 Transition of Care [facility]) - Emergency Department Mini Assessment   Patient Details  Name: Phillip Morales MRN: [id number] Date of Birth: [birth year]  [facility] Care [facility]) CM/SW Contact:    Noreen KATHEE Pinal, LCSWA Phone Number: 11/17/2023, 10:09 AM   Clinical Narrative:  CSW received a consult for medication assistance. CSW reached out to pharmacy via secure chat to see if MATCH would cover patient biktarvy  and vorvasc medication. Pharamcy shared that the [facility] does not cover the Biktarvy  , but will cover Vorvasc. CSW discussed options with patient and provided a GoodRX card because he can get 90 pills of Vorvasc for $13. Patient agreeable. CSW also provided a [facility] and [facility] resources pamphlet to assist with any other medical needs and a  website that can assist with getting the Biktarvy  medication and a brochure with instructions on how to get process started. ICM signing off.   ED Mini Assessment: What brought you to the Emergency Department? : c/o dizziness with sweating for a couple minutes, ruq/side pain intermittent and gen weakness x 3 days  Barriers to Discharge: ED Medication assistance  Barrier interventions: MATCH voucher needed  Means of departure: Not know  Interventions which prevented an admission or readmission: Other (must enter comment), [employer] [facility] voucher)    Patient Contact and Communications Key Contact 1: Eva Ruth   Spoke with: Patient Contact Date: 11/17/23,   Contact time: 1008 Contact Phone Number: [phone]    Patient states their goals for this hospitalization and ongoing recovery are:: assistance with the vorvasc medication      Admission diagnosis:  Weakness,Dizziness Patient Active Problem List   Diagnosis Date Noted   ERRONEOUS ENCOUNTER--DISREGARD 11/07/2023   Encounter for long-term (current) use of high-risk medication 01/27/2022   Screening examination for sexually transmitted disease  11/10/2017   Perirectal abscess 10/15/2017   Vitamin D  deficiency 05/27/2014   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   Essential hypertension 12/25/2013   Tobacco dependence 12/25/2013   History of MRSA infection 02/16/2013   Essential hypertension, benign 02/16/2013   History of syphilis 02/16/2013   Human immunodeficiency virus (HIV) disease (HCC) 10/24/2012   Anemia 10/23/2012   PCP:  Freddrick, No Pharmacy:   [id number] [place], [place] - [address] AT [facility] [address] [place] [address] Phone: [phone] Fax: [phone]  [facility] [phone] @ [address] [place], [place] - [address] [place] [address] Phone: [phone] Fax: [phone]  [facility] [address], [place] - [address] [place] [address] [place] [address] [place] [address] Phone: [phone] Fax: [phone]

## 2023-11-18 ENCOUNTER — Encounter (HOSPITAL_COMMUNITY): Payer: Self-pay | Admitting: Emergency Medicine

## 2023-11-18 ENCOUNTER — Other Ambulatory Visit: Payer: Self-pay

## 2023-11-18 ENCOUNTER — Emergency Department (HOSPITAL_COMMUNITY)
Admission: EM | Admit: 2023-11-18 | Discharge: 2023-11-19 | Disposition: A | Discharge status: Home / Self Care | Payer: Self-pay | Attending: Emergency Medicine | Admitting: Emergency Medicine

## 2023-11-18 DIAGNOSIS — J45909 Unspecified asthma, uncomplicated: Secondary | ICD-10-CM | POA: Insufficient documentation

## 2023-11-18 DIAGNOSIS — I10 Essential (primary) hypertension: Secondary | ICD-10-CM | POA: Insufficient documentation

## 2023-11-18 DIAGNOSIS — F17210 Nicotine dependence, cigarettes, uncomplicated: Secondary | ICD-10-CM | POA: Insufficient documentation

## 2023-11-18 LAB — CBC
HCT: 36.4 % — ABNORMAL LOW (ref 39.0–52.0)
Hemoglobin: 13 g/dL (ref 13.0–17.0)
MCH: 29.5 pg (ref 26.0–34.0)
MCHC: 35.7 g/dL (ref 30.0–36.0)
MCV: 82.7 fL (ref 80.0–100.0)
Platelets: 215 K/uL (ref 150–400)
RBC: 4.4 MIL/uL (ref 4.22–5.81)
RDW: 13.3 % (ref 11.5–15.5)
WBC: 4.3 K/uL (ref 4.0–10.5)
nRBC: 0 % (ref 0.0–0.2)

## 2023-11-18 LAB — COMPREHENSIVE METABOLIC PANEL WITH GFR
ALT: 19 U/L (ref 0–44)
AST: 27 U/L (ref 15–41)
Albumin: 4.2 g/dL (ref 3.5–5.0)
Alkaline Phosphatase: 55 U/L (ref 38–126)
Anion gap: 11 (ref 5–15)
BUN: 13 mg/dL (ref 6–20)
CO2: 22 mmol/L (ref 22–32)
Calcium: 9 mg/dL (ref 8.9–10.3)
Chloride: 104 mmol/L (ref 98–111)
Creatinine, Ser: 1.17 mg/dL (ref 0.61–1.24)
GFR, Estimated: >60 mL/min (ref >60)
Glucose, Bld: 92 mg/dL (ref 70–99)
Potassium: 3.7 mmol/L (ref 3.5–5.1)
Sodium: 137 mmol/L (ref 135–145)
Total Bilirubin: 0.5 mg/dL (ref 0.0–1.2)
Total Protein: 8.3 g/dL — ABNORMAL HIGH (ref 6.5–8.1)

## 2023-11-18 LAB — TROPONIN I (HIGH SENSITIVITY): Troponin I (High Sensitivity): 6 ng/L (ref <18)

## 2023-11-18 MED ORDER — LORAZEPAM 2 MG/ML IJ SOLN
1.0000 mg | Freq: Once | INTRAMUSCULAR | Status: AC
  Administered 2023-11-18: 1 mg via INTRAVENOUS
  Filled 2023-11-18: qty 1

## 2023-11-18 MED ORDER — AMLODIPINE BESYLATE 5 MG PO TABS
5.0000 mg | ORAL_TABLET | Freq: Once | ORAL | Status: AC
  Administered 2023-11-18: 5 mg via ORAL
  Filled 2023-11-18: qty 1

## 2023-11-18 NOTE — ED Triage Notes (Addendum)
 Pt with feeling anxious, feeling like he would pass out, fidgeting.   Pt is hypertensive in triage. Reports not having his BP meds in a while.  Pt endorses smoking marijuana earlier as well.

## 2023-11-18 NOTE — ED Provider Notes (Addendum)
 [facility]-EMERGENCY DEPT [facility] Emergency Department Provider Note MRN:  [id number]  Arrival date & time: 11/19/23     Chief Complaint   Anxiety   History of Present Illness   Phillip Morales is a 38 y.o. year-old male with a history of hypertension, HIV presenting to the ED with chief complaint of anxiety.  Patient was sitting at home and suddenly felt very flushed, anxious, felt like he was going to pass out, felt very restless.  Noted to be hypertensive.  Does not have access to his high blood pressure medications recently.  Smoked marijuana a few hours ago.  Denies any pain but had a funny feeling in his neck during this event.  No shortness of breath, no leg pain or swelling, no abdominal pain.  Review of Systems  A thorough review of systems was obtained and all systems are negative except as noted in the HPI and PMH.   Patient's Health History    Past Medical History:  Diagnosis Date   Asthma    HIV (human immunodeficiency virus infection) (HCC)    Hypertension     History reviewed. No pertinent surgical history.  Family History  Problem Relation Age of Onset   Kidney disease Neg Hx     Social History   Socioeconomic History   Marital status: Single    Spouse name: Not on file   Number of children: Not on file   Years of education: Not on file   Highest education level: Not on file  Occupational History   Not on file  Tobacco Use   Smoking status: Some Days    Current packs/day: 1.00    Average packs/day: 1 pack/day for 10.7 years (10.7 ttl pk-yrs)    Types: Cigarettes    Start date: 03/03/2013   Smokeless tobacco: Never   Tobacco comments:    1 pack every 3 days  Vaping Use   Vaping status: Former  Substance and Sexual Activity   Alcohol use: Not Currently    Alcohol/week: 0.0 standard drinks of alcohol    Comment: social   Drug use: Yes    Types: Marijuana    Comment: occ. use   Sexual activity: Not Currently    Partners: Male    Birth  control/protection: Condom    Comment: pt given condoms  Other Topics Concern   Not on file  Social History Narrative   Not on file   Social Drivers of Health   Financial Resource Strain: Not on file  Food Insecurity: Not on file  Transportation Needs: Not on file  Physical Activity: Not on file  Stress: Not on file  Social Connections: Not on file  Intimate Partner Violence: Not on file     Physical Exam   Vitals:   11/19/23 0030 11/19/23 0046  BP: (!) 162/121   Pulse: 66 66  Resp: 15 20  Temp:    SpO2: 99% 98%    CONSTITUTIONAL: Well-appearing, NAD NEURO/PSYCH:  Alert and oriented x 3, no focal deficits EYES:  eyes equal and reactive ENT/NECK:  no LAD, no JVD CARDIO: Regular rate, well-perfused, normal S1 and S2 PULM:  CTAB no wheezing or rhonchi GI/GU:  non-distended, non-tender MSK/SPINE:  No gross deformities, no edema SKIN:  no rash, atraumatic   *Additional and/or pertinent findings included in MDM below  Diagnostic and Interventional Summary    EKG Interpretation Date/Time:  Friday November 18 2023 22:58:15 EDT Ventricular Rate:  88 PR Interval:  154 QRS Duration:  100 QT Interval:  367 QTC Calculation: 444 R Axis:   28  Text Interpretation: Sinus rhythm Left atrial enlargement RSR' in V1 or V2, probably normal variant Confirmed by Theadore Sharper [phone]) on 11/18/2023 11:31:12 PM       Labs Reviewed  CBC - Abnormal; Notable for the following components:      Result Value   HCT 36.4 (*)    All other components within normal limits  COMPREHENSIVE METABOLIC PANEL WITH GFR - Abnormal; Notable for the following components:   Total Protein 8.3 (*)    All other components within normal limits  TROPONIN I (HIGH SENSITIVITY)    No orders to display    Medications  LORazepam  (ATIVAN ) injection 1 mg (1 mg Intravenous Given 11/18/23 2320)  amLODipine  (NORVASC ) tablet 5 mg (5 mg Oral Given 11/18/23 2352)     Procedures  /  Critical Care Procedures  ED  Course and Medical Decision Making  Initial Impression and Ddx Differential diagnosis includes hypertensive urgency or emergency, atypical presentation of ACS, electrolyte disturbance, cardiac arrhythmia, anxiety or panic attack, side effect of marijuana.  Denies cocaine.  Past medical/surgical history that increases complexity of ED encounter: Hypertension  Interpretation of Diagnostics I personally reviewed the Laboratory Testing and my interpretation is as follows: No significant blood count or electrolyte disturbance.  Troponin negative  I also interpreted the EKG and did not find any concerning ischemic findings, similar to prior  Patient Reassessment and Ultimate Disposition/Management     Patient is resting comfortably on reassessment.  Vitals continue to improve.  Patient encouraged to feel prescription for amlodipine , return precautions discussed.  Patient management required discussion with the following services or consulting groups:  None  Complexity of Problems Addressed Acute illness or injury that poses threat of life of bodily function  Additional Data Reviewed and Analyzed Further history obtained from: Prior ED visit notes and Prior labs/imaging results  Additional Factors Impacting ED Encounter Risk Use of parenteral controlled substances and Consideration of hospitalization  Sharper HERO. Theadore, MD [facility] Emergency Medicine [facility] [email or web address]  Final Clinical Impressions(s) / ED Diagnoses     ICD-10-CM   1. Hypertension, unspecified type  I10       ED Discharge Orders          Ordered    amLODipine  (NORVASC ) 5 MG tablet  Daily        11/19/23 0100             Discharge Instructions Discussed with and Provided to Patient:     Discharge Instructions      You were evaluated in the Emergency Department and after careful evaluation, we did not find any emergent condition requiring admission or further testing in the  hospital.  Your exam/testing today is overall reassuring.  Symptoms may be related to your high blood pressure.  Very important that you start the amlodipine  medication prescribed.  Would recommend using GoodRx, should be able to get a month of this medicine for under $10.  Please return to the Emergency Department if you experience any worsening of your condition.   Thank you for allowing us  to be a part of your care.       Theadore Sharper HERO, MD 11/19/23 [id number]    Theadore Sharper HERO, MD 11/19/23 [phone]

## 2023-11-19 MED ORDER — AMLODIPINE BESYLATE 5 MG PO TABS: 5.0000 mg | ORAL_TABLET | Freq: Every day | ORAL | 1 refills | Status: AC

## 2023-11-19 MED ORDER — AMLODIPINE BESYLATE 5 MG PO TABS: 5.0000 mg | ORAL_TABLET | Freq: Every day | ORAL | 1 refills | Status: DC

## 2023-11-19 NOTE — Discharge Instructions (Signed)
 You were evaluated in the Emergency Department and after careful evaluation, we did not find any emergent condition requiring admission or further testing in the hospital.  Your exam/testing today is overall reassuring.  Symptoms may be related to your high blood pressure.  Very important that you start the amlodipine  medication prescribed.  Would recommend using GoodRx, should be able to get a month of this medicine for under $10.  Please return to the Emergency Department if you experience any worsening of your condition.   Thank you for allowing us  to be a part of your care.

## 2023-12-28 ENCOUNTER — Encounter (HOSPITAL_COMMUNITY): Payer: Self-pay | Admitting: Emergency Medicine

## 2023-12-28 ENCOUNTER — Emergency Department (HOSPITAL_COMMUNITY)
Admission: EM | Admit: 2023-12-28 | Discharge: 2023-12-28 | Disposition: A | Discharge status: Home / Self Care | Payer: Self-pay | Attending: Emergency Medicine | Admitting: Emergency Medicine

## 2023-12-28 ENCOUNTER — Other Ambulatory Visit: Payer: Self-pay

## 2023-12-28 DIAGNOSIS — Z91010 Allergy to peanuts: Secondary | ICD-10-CM | POA: Insufficient documentation

## 2023-12-28 DIAGNOSIS — L0231 Cutaneous abscess of buttock: Secondary | ICD-10-CM | POA: Insufficient documentation

## 2023-12-28 DIAGNOSIS — Z79899 Other long term (current) drug therapy: Secondary | ICD-10-CM | POA: Insufficient documentation

## 2023-12-28 DIAGNOSIS — Z21 Asymptomatic human immunodeficiency virus [HIV] infection status: Secondary | ICD-10-CM | POA: Insufficient documentation

## 2023-12-28 DIAGNOSIS — I10 Essential (primary) hypertension: Secondary | ICD-10-CM | POA: Insufficient documentation

## 2023-12-28 MED ORDER — SULFAMETHOXAZOLE-TRIMETHOPRIM 800-160 MG PO TABS
1.0000 | ORAL_TABLET | Freq: Once | ORAL | Status: AC
  Administered 2023-12-28: 1 via ORAL
  Filled 2023-12-28: qty 1

## 2023-12-28 MED ORDER — CEPHALEXIN 500 MG PO CAPS: 500.0000 mg | ORAL_CAPSULE | Freq: Four times a day (QID) | ORAL | 0 refills | Status: AC

## 2023-12-28 MED ORDER — CEPHALEXIN 500 MG PO CAPS
500.0000 mg | ORAL_CAPSULE | Freq: Once | ORAL | Status: AC
  Administered 2023-12-28: 500 mg via ORAL
  Filled 2023-12-28: qty 1

## 2023-12-28 MED ORDER — SULFAMETHOXAZOLE-TRIMETHOPRIM 800-160 MG PO TABS
1.0000 | ORAL_TABLET | Freq: Two times a day (BID) | ORAL | 0 refills | Status: AC
Start: 2023-12-28 — End: 2024-01-04

## 2023-12-28 NOTE — Discharge Instructions (Signed)
 Begin taking Keflex and Bactrim  as prescribed.  Apply warm compresses or perform sitz bath's as frequently as possible.  Follow-up with general surgery in the next few days.  The contact information for Dr. Kallie has been provided in this discharge summary for you to call and make these arrangements.

## 2023-12-28 NOTE — ED Triage Notes (Signed)
 Pt c/o abscess to left buttock x 4 days, hx of same

## 2023-12-28 NOTE — ED Provider Notes (Addendum)
  [place] EMERGENCY DEPARTMENT AT [facility] Provider Note   CSN: [id number] Arrival date & time: 12/28/23  [id number]     Patient presents with: Abscess   Phillip Morales is a 38 y.o. male.   Patient is a 38 year old male with history of HIV disease, hypertension, and perirectal abscess.  Patient presenting today with complaints of left buttock pain.  6 years ago he had issues with a buttock abscess that required antibiotics and incision and drainage.  The pain and swelling has returned in this area.  He denies any difficulty stooling.  The area is draining purulent material.       Prior to Admission medications   Medication Sig Start Date End Date Taking? Authorizing Provider  amLODipine  (NORVASC ) 5 MG tablet Take 1 tablet (5 mg total) by mouth daily. 11/19/23   Theadore Ozell HERO, MD  bictegravir-emtricitabine -tenofovir  AF (BIKTARVY ) 50-200-25 MG TABS tablet Take 1 tablet by mouth daily. 07/13/23   Dennise Kingsley, MD    Allergies: Peanut-containing drug products    Review of Systems  All other systems reviewed and are negative.   Updated Vital Signs BP (!) 162/87   Pulse 80   Temp 99.1 F (37.3 C) (Oral)   Resp 16   Ht 5' 7 (1.702 m)   Wt 94.8 kg   SpO2 95%   BMI 32.73 kg/m   Physical Exam Vitals and nursing note reviewed.  Constitutional:      Appearance: Normal appearance.  Pulmonary:     Effort: Pulmonary effort is normal.  Skin:    General: Skin is warm and dry.     Comments: To the left buttock, there is an area of induration measuring approximately 4 cm x 8 cm with purulent material draining.  Neurological:     Mental Status: He is alert and oriented to person, place, and time.     (all labs ordered are listed, but only abnormal results are displayed) Labs Reviewed - No data to display  EKG: None  Radiology: No results found.   Procedures   Medications Ordered in the ED - No data to display                                  Medical  Decision Making Risk Prescription drug management.   Patient is presenting with a buttock abscess that is draining purulent material spontaneously.  I do not feel as though incision and drainage is indicated given the fact that it is already draining.  I will treat him with antibiotics and continued use of warm compresses.  Patient requests referral to general surgery.  The abscess is located to the left buttock and not in the vicinity of the anus or rectum.  Final diagnoses:  None    ED Discharge Orders     None          Geroldine Berg, MD 12/28/23 9580    Geroldine Berg, MD 12/28/23 0422

## 2024-01-03 ENCOUNTER — Other Ambulatory Visit (HOSPITAL_COMMUNITY): Payer: Self-pay

## 2024-01-12 ENCOUNTER — Ambulatory Visit: Admitting: Internal Medicine

## 2024-01-16 ENCOUNTER — Other Ambulatory Visit: Payer: Self-pay

## 2024-01-16 ENCOUNTER — Encounter: Payer: Self-pay | Admitting: Internal Medicine

## 2024-01-16 ENCOUNTER — Ambulatory Visit: Admitting: Internal Medicine

## 2024-01-16 VITALS — BP 187/119 | HR 77 | Temp 99.0°F | Wt 210.0 lb

## 2024-01-16 DIAGNOSIS — B20 Human immunodeficiency virus [HIV] disease: Secondary | ICD-10-CM

## 2024-01-16 DIAGNOSIS — Z79899 Other long term (current) drug therapy: Secondary | ICD-10-CM | POA: Diagnosis not present

## 2024-01-16 MED ORDER — BIKTARVY 50-200-25 MG PO TABS: 1.0000 | ORAL_TABLET | Freq: Every day | ORAL | 11 refills | Status: DC

## 2024-01-16 NOTE — Progress Notes (Signed)
 [facility]     HPI: Phillip Morales is a 38 y.o. male presents for HIV management. oN biktarvy . Rarely misses doses.   has not been o nart x 2 months. Not sexually active since lv    Date of diagnosis 2014 ART exposure prezista + retonavir->truvada + prezista ->biktarvy  Past OIs Risk factors: MSM Partners in last 2months 2 Anal sex verse. Contraception most of the time     Social: Occupation:  education officer, environmental,  Housing: insurance claims handler, apt by himself Support: father Understanding of HIV: good Etoh/drug/tobacco use: Socail/mj/occaisional  Past Medical History:  Diagnosis Date   Asthma    HIV (human immunodeficiency virus infection) (HCC)    Hypertension     No past surgical history on file.  Family History  Problem Relation Age of Onset   Kidney disease Neg Hx    Current Outpatient Medications on File Prior to Visit  Medication Sig Dispense Refill   bictegravir-emtricitabine -tenofovir  AF (BIKTARVY ) 50-200-25 MG TABS tablet Take 1 tablet by mouth daily. 30 tablet 11   amLODipine  (NORVASC ) 5 MG tablet Take 1 tablet (5 mg total) by mouth daily. (Patient not taking: Reported on 01/16/2024) 30 tablet 1   cephALEXin (KEFLEX) 500 MG capsule Take 1 capsule (500 mg total) by mouth 4 (four) times daily. (Patient not taking: Reported on 01/16/2024) 40 capsule 0   No current facility-administered medications on file prior to visit.    Allergies  Allergen Reactions   Peanut-Containing Drug Products Anaphylaxis      Lab Results HIV 1 RNA Quant  Date Value  10/06/2023 NOT DETECTED copies/mL  06/30/2023 <20 DETECTED copies/mL (A)  10/07/2022 Not Detected Copies/mL   CD4 T Cell Abs (/uL)  Date Value  10/06/2023 344 (L)  10/07/2022 329 (L)  07/14/2022 298 (L)   No results found for: HIV1GENOSEQ Lab Results  Component Value Date   WBC 4.3 11/18/2023   HGB 13.0 11/18/2023   HCT 36.4 (L) 11/18/2023   MCV 82.7 11/18/2023   PLT 215 11/18/2023     Lab Results  Component Value Date   CREATININE 1.17 11/18/2023   BUN 13 11/18/2023   NA 137 11/18/2023   K 3.7 11/18/2023   CL 104 11/18/2023   CO2 22 11/18/2023   Lab Results  Component Value Date   ALT 19 11/18/2023   AST 27 11/18/2023   ALKPHOS 55 11/18/2023   BILITOT 0.5 11/18/2023    Lab Results  Component Value Date   CHOL 170 06/30/2023   TRIG 403 (H) 06/30/2023   HDL 25 (L) 06/30/2023   LDLCALC  06/30/2023     Comment:     . LDL cholesterol not calculated. Triglyceride levels greater than 400 mg/dL invalidate calculated LDL results. . Reference range: <100 . Desirable range <100 mg/dL for primary prevention;   <70 mg/dL for patients with CHD or diabetic patients  with > or = 2 CHD risk factors. SABRA LDL-C is now calculated using the Martin-Hopkins  calculation, which is a validated novel method providing  better accuracy than the Friedewald equation in the  estimation of LDL-C.  Phillip Morales et al. Phillip Morales. 7986;689(80): 2061-2068  (http://education.[employer].com/faq/FAQ164)    Lab Results  Component Value Date   HAV NON REACTIVE 02/13/2013   Lab Results  Component Value Date   HEPBSAG NEGATIVE 02/13/2013   HEPBSAB POS (A) 02/13/2013   Lab Results  Component Value Date   HCVAB NEGATIVE 02/13/2013   Lab Results  Component Value  Date   CHLAMYDIAWP Negative 10/06/2023   N Negative 10/06/2023   No results found for: GCPROBEAPT No results found for: QUANTGOLD  Assessment/Plan #HIV -CD4 344, VL nd, on 7/25 -continue biktarvy , missed 2 months due to med acquisition. Counseled on adherence.  -F/U 3 months     #Vaccination COVID Fluuri currently, no flu shot todau Monkeypox PCV 23 09/27/2018 Meningitis' utd HPV 20223 HepA serology today 01/16/24 HEpB immune Tdap dose today Shingles   #Health maintenance -Quantiferon NV -RPR nr on 10/06/23 hx1:16 on 10/07/22->1:8 06/30/23 (initial 1:64 on 01/21/22->sp pen x 1 dose -HCV tNV -GC today->  3 point -Lipid -Dysplasia screen M 10/2022 Age dependant -Colonoscopy    Loney Stank, MD [facility] I have personally spent 42 minutes involved in face-to-face and non-face-to-face activities for this patient on the day of the visit. Professional time spent includes the following activities: Preparing to see the patient (review of tests), Obtaining and/or reviewing separately obtained history (admission/discharge record), Performing a medically appropriate examination and/or evaluation , Ordering medications/tests/procedures, referring and communicating with other health care professionals, Documenting clinical information in the EMR, Independently interpreting results (not separately reported), Communicating results to the patient/family/caregiver, Counseling and educating the patient/family/caregiver and Care coordination (not separately reported).

## 2024-01-17 LAB — T-HELPER CELLS (CD4) COUNT (NOT AT ARMC)
CD4 % Helper T Cell: 19 % — ABNORMAL LOW (ref 33–65)
CD4 T Cell Abs: 212 /uL — ABNORMAL LOW (ref 400–1790)

## 2024-01-18 ENCOUNTER — Other Ambulatory Visit (HOSPITAL_COMMUNITY): Payer: Self-pay

## 2024-01-18 LAB — CBC WITH DIFFERENTIAL/PLATELET
Absolute Lymphocytes: 1166 {cells}/uL (ref 850–3900)
Absolute Monocytes: 435 {cells}/uL (ref 200–950)
Basophils Absolute: 10 {cells}/uL (ref 0–200)
Basophils Relative: 0.3 %
Eosinophils Absolute: 119 {cells}/uL (ref 15–500)
Eosinophils Relative: 3.5 %
HCT: 35.7 % — ABNORMAL LOW (ref 38.5–50.0)
Hemoglobin: 11.8 g/dL — ABNORMAL LOW (ref 13.2–17.1)
MCH: 28.7 pg (ref 27.0–33.0)
MCHC: 33.1 g/dL (ref 32.0–36.0)
MCV: 86.9 fL (ref 80.0–100.0)
MPV: 11.1 fL (ref 7.5–12.5)
Monocytes Relative: 12.8 %
Neutro Abs: 1669 {cells}/uL (ref 1500–7800)
Neutrophils Relative %: 49.1 %
Platelets: 115 Thousand/uL — ABNORMAL LOW (ref 140–400)
RBC: 4.11 Million/uL — ABNORMAL LOW (ref 4.20–5.80)
RDW: 13.8 % (ref 11.0–15.0)
Total Lymphocyte: 34.3 %
WBC: 3.4 Thousand/uL — ABNORMAL LOW (ref 3.8–10.8)

## 2024-01-18 LAB — COMPLETE METABOLIC PANEL WITHOUT GFR
AG Ratio: 1 (calc) (ref 1.0–2.5)
ALT: 114 U/L — ABNORMAL HIGH (ref 9–46)
AST: 81 U/L — ABNORMAL HIGH (ref 10–40)
Albumin: 4.1 g/dL (ref 3.6–5.1)
Alkaline phosphatase (APISO): 63 U/L (ref 36–130)
BUN: 7 mg/dL (ref 7–25)
CO2: 29 mmol/L (ref 20–32)
Calcium: 9.2 mg/dL (ref 8.6–10.3)
Chloride: 104 mmol/L (ref 98–110)
Creat: 1.11 mg/dL (ref 0.60–1.26)
Globulin: 4.3 g/dL — ABNORMAL HIGH (ref 1.9–3.7)
Glucose, Bld: 97 mg/dL (ref 65–99)
Potassium: 4.5 mmol/L (ref 3.5–5.3)
Sodium: 139 mmol/L (ref 135–146)
Total Bilirubin: 0.4 mg/dL (ref 0.2–1.2)
Total Protein: 8.4 g/dL — ABNORMAL HIGH (ref 6.1–8.1)

## 2024-01-18 LAB — HIV-1 RNA QUANT-NO REFLEX-BLD
HIV 1 RNA Quant: 44500 {copies}/mL — ABNORMAL HIGH
HIV-1 RNA Quant, Log: 4.65 {Log_copies}/mL — ABNORMAL HIGH

## 2024-01-18 LAB — HEPATITIS A ANTIBODY, TOTAL: Hepatitis A AB,Total: REACTIVE — AB

## 2024-01-19 ENCOUNTER — Ambulatory Visit: Attending: Internal Medicine | Admitting: Internal Medicine

## 2024-01-19 NOTE — Progress Notes (Signed)
 Erroneous encounter - please disregard.

## 2024-03-12 ENCOUNTER — Other Ambulatory Visit: Payer: Self-pay

## 2024-03-12 MED ORDER — BIKTARVY 50-200-25 MG PO TABS: 1.0000 | ORAL_TABLET | Freq: Every day | ORAL | 10 refills | Status: DC

## 2024-04-11 ENCOUNTER — Other Ambulatory Visit: Payer: Self-pay | Admitting: Pharmacist

## 2024-04-11 DIAGNOSIS — B20 Human immunodeficiency virus [HIV] disease: Secondary | ICD-10-CM

## 2024-04-11 MED ORDER — BIKTARVY 50-200-25 MG PO TABS: 1.0000 | ORAL_TABLET | Freq: Every day | ORAL | 9 refills | Status: AC

## 2024-04-11 NOTE — Progress Notes (Signed)
 Charmaine stated patient's insurance requests him to fill with [facility]; will resend rx.  Alan Geralds, PharmD, CPP, BCIDP, AAHIVP Clinical Pharmacist Practitioner Infectious Diseases Clinical Pharmacist [facility]

## 2024-04-18 ENCOUNTER — Other Ambulatory Visit: Payer: Self-pay

## 2024-04-18 DIAGNOSIS — Z113 Encounter for screening for infections with a predominantly sexual mode of transmission: Secondary | ICD-10-CM

## 2024-04-18 DIAGNOSIS — B20 Human immunodeficiency virus [HIV] disease: Secondary | ICD-10-CM

## 2024-04-19 ENCOUNTER — Other Ambulatory Visit

## 2024-05-03 ENCOUNTER — Ambulatory Visit: Payer: Self-pay | Admitting: Internal Medicine
